# Patient Record
Sex: Female | Born: 1960
Health system: Southern US, Community
[De-identification: ages and names within clinical notes are randomized; demographics above are authoritative.]

## PROBLEM LIST (undated history)

## (undated) DIAGNOSIS — E785 Hyperlipidemia, unspecified: Secondary | ICD-10-CM

## (undated) DIAGNOSIS — U071 COVID-19: Secondary | ICD-10-CM

## (undated) DIAGNOSIS — I10 Essential (primary) hypertension: Secondary | ICD-10-CM

## (undated) DIAGNOSIS — R011 Cardiac murmur, unspecified: Secondary | ICD-10-CM

## (undated) DIAGNOSIS — K5790 Diverticulosis of intestine, part unspecified, without perforation or abscess without bleeding: Secondary | ICD-10-CM

## (undated) DIAGNOSIS — G43909 Migraine, unspecified, not intractable, without status migrainosus: Secondary | ICD-10-CM

## (undated) DIAGNOSIS — M810 Age-related osteoporosis without current pathological fracture: Secondary | ICD-10-CM

## (undated) DIAGNOSIS — N6452 Nipple discharge: Secondary | ICD-10-CM

## (undated) HISTORY — PX: BACK SURGERY: SHX140

## (undated) HISTORY — DX: Essential (primary) hypertension: I10

## (undated) HISTORY — DX: Cardiac murmur, unspecified: R01.1

## (undated) HISTORY — PX: ABDOMINAL HYSTERECTOMY: SHX81

## (undated) HISTORY — DX: Age-related osteoporosis without current pathological fracture: M81.0

## (undated) HISTORY — DX: COVID-19: U07.1

## (undated) HISTORY — DX: Hyperlipidemia, unspecified: E78.5

## (undated) HISTORY — DX: Diverticulosis of intestine, part unspecified, without perforation or abscess without bleeding: K57.90

---

## 2006-01-23 ENCOUNTER — Ambulatory Visit: Payer: Self-pay

## 2006-10-31 ENCOUNTER — Ambulatory Visit: Payer: Self-pay | Admitting: Internal Medicine

## 2006-11-25 ENCOUNTER — Ambulatory Visit: Payer: Self-pay | Admitting: Internal Medicine

## 2007-01-24 ENCOUNTER — Inpatient Hospital Stay: Payer: Self-pay | Admitting: Obstetrics and Gynecology

## 2007-02-02 ENCOUNTER — Emergency Department: Payer: Self-pay | Admitting: Unknown Physician Specialty

## 2007-06-28 ENCOUNTER — Emergency Department: Payer: Self-pay | Admitting: General Practice

## 2007-07-23 ENCOUNTER — Emergency Department: Payer: Self-pay | Admitting: Emergency Medicine

## 2007-12-24 ENCOUNTER — Ambulatory Visit: Payer: Self-pay | Admitting: Internal Medicine

## 2008-01-12 ENCOUNTER — Ambulatory Visit: Payer: Self-pay | Admitting: Internal Medicine

## 2008-02-10 ENCOUNTER — Ambulatory Visit: Payer: Self-pay | Admitting: Gastroenterology

## 2008-03-03 ENCOUNTER — Ambulatory Visit: Payer: Self-pay | Admitting: Internal Medicine

## 2008-03-11 ENCOUNTER — Ambulatory Visit: Payer: Self-pay | Admitting: Gastroenterology

## 2008-04-30 ENCOUNTER — Ambulatory Visit: Payer: Self-pay | Admitting: Internal Medicine

## 2008-05-24 ENCOUNTER — Inpatient Hospital Stay: Payer: Self-pay | Admitting: Internal Medicine

## 2008-05-31 ENCOUNTER — Ambulatory Visit: Payer: Self-pay | Admitting: Internal Medicine

## 2008-06-22 ENCOUNTER — Ambulatory Visit: Payer: Self-pay | Admitting: Internal Medicine

## 2008-06-30 ENCOUNTER — Ambulatory Visit: Payer: Self-pay | Admitting: Internal Medicine

## 2008-10-04 ENCOUNTER — Other Ambulatory Visit: Payer: Self-pay

## 2008-10-04 ENCOUNTER — Emergency Department: Payer: Self-pay

## 2009-04-07 ENCOUNTER — Ambulatory Visit: Payer: Self-pay | Admitting: Internal Medicine

## 2009-07-07 ENCOUNTER — Emergency Department: Payer: Self-pay | Admitting: Emergency Medicine

## 2009-10-04 ENCOUNTER — Ambulatory Visit: Payer: Self-pay | Admitting: Internal Medicine

## 2009-10-12 ENCOUNTER — Ambulatory Visit: Payer: Self-pay | Admitting: Internal Medicine

## 2010-02-25 ENCOUNTER — Observation Stay: Payer: Self-pay | Admitting: Internal Medicine

## 2010-03-06 ENCOUNTER — Encounter: Payer: Self-pay | Admitting: Cardiology

## 2010-07-10 ENCOUNTER — Emergency Department: Payer: Self-pay | Admitting: Emergency Medicine

## 2010-11-01 ENCOUNTER — Ambulatory Visit: Payer: Self-pay | Admitting: Internal Medicine

## 2010-12-08 ENCOUNTER — Encounter: Payer: Self-pay | Admitting: Cardiology

## 2010-12-30 ENCOUNTER — Emergency Department: Payer: Self-pay | Admitting: Emergency Medicine

## 2011-01-09 ENCOUNTER — Ambulatory Visit: Payer: Self-pay

## 2011-01-17 ENCOUNTER — Ambulatory Visit: Payer: Self-pay

## 2011-01-26 ENCOUNTER — Encounter: Payer: Self-pay | Admitting: Cardiology

## 2011-01-26 LAB — CBC
HCT: 42.3 % (ref 36.0–46.0)
Hemoglobin: 14.1 g/dL (ref 12.0–15.0)
MCH: 28.1 pg (ref 26.0–34.0)
RBC: 5.01 MIL/uL (ref 3.87–5.11)

## 2011-01-26 LAB — SURGICAL PCR SCREEN: MRSA, PCR: NEGATIVE

## 2011-01-26 LAB — BASIC METABOLIC PANEL
CO2: 28 mEq/L (ref 19–32)
Chloride: 102 mEq/L (ref 96–112)
Creatinine, Ser: 0.86 mg/dL (ref 0.4–1.2)
GFR calc Af Amer: >60 mL/min (ref >60)
Glucose, Bld: 85 mg/dL (ref 70–99)
Sodium: 140 mEq/L (ref 135–145)

## 2011-01-29 ENCOUNTER — Ambulatory Visit
Admission: RE | Admit: 2011-01-29 | Discharge: 2011-01-29 | Payer: Self-pay | Source: Home / Self Care | Attending: Cardiology | Admitting: Cardiology

## 2011-01-29 DIAGNOSIS — I10 Essential (primary) hypertension: Secondary | ICD-10-CM | POA: Insufficient documentation

## 2011-01-29 DIAGNOSIS — R9431 Abnormal electrocardiogram [ECG] [EKG]: Secondary | ICD-10-CM | POA: Insufficient documentation

## 2011-01-30 ENCOUNTER — Telehealth: Payer: Self-pay | Admitting: Cardiology

## 2011-01-31 ENCOUNTER — Ambulatory Visit (HOSPITAL_COMMUNITY): Payer: 59

## 2011-01-31 ENCOUNTER — Encounter: Payer: Self-pay | Admitting: Cardiology

## 2011-01-31 ENCOUNTER — Ambulatory Visit (HOSPITAL_COMMUNITY): Payer: 59 | Attending: Internal Medicine

## 2011-01-31 DIAGNOSIS — E669 Obesity, unspecified: Secondary | ICD-10-CM | POA: Insufficient documentation

## 2011-01-31 DIAGNOSIS — I10 Essential (primary) hypertension: Secondary | ICD-10-CM | POA: Insufficient documentation

## 2011-01-31 DIAGNOSIS — R9431 Abnormal electrocardiogram [ECG] [EKG]: Secondary | ICD-10-CM | POA: Insufficient documentation

## 2011-02-01 ENCOUNTER — Encounter: Payer: Self-pay | Admitting: Cardiology

## 2011-02-01 ENCOUNTER — Other Ambulatory Visit: Payer: Self-pay | Admitting: Cardiology

## 2011-02-01 ENCOUNTER — Other Ambulatory Visit (INDEPENDENT_AMBULATORY_CARE_PROVIDER_SITE_OTHER): Payer: 59

## 2011-02-01 DIAGNOSIS — R072 Precordial pain: Secondary | ICD-10-CM

## 2011-02-01 DIAGNOSIS — Z79899 Other long term (current) drug therapy: Secondary | ICD-10-CM

## 2011-02-01 DIAGNOSIS — R0789 Other chest pain: Secondary | ICD-10-CM

## 2011-02-01 LAB — BASIC METABOLIC PANEL
CO2: 30 mEq/L (ref 19–32)
Chloride: 99 mEq/L (ref 96–112)
Glucose, Bld: 89 mg/dL (ref 70–99)
Sodium: 137 mEq/L (ref 135–145)

## 2011-02-01 LAB — APTT: aPTT: 30.5 s — ABNORMAL HIGH (ref 21.7–28.8)

## 2011-02-01 LAB — CBC WITH DIFFERENTIAL/PLATELET
Basophils Absolute: 0 10*3/uL (ref 0.0–0.1)
Basophils Relative: 0.2 % (ref 0.0–3.0)
Eosinophils Absolute: 0.1 10*3/uL (ref 0.0–0.7)
Eosinophils Relative: 2.9 % (ref 0.0–5.0)
HCT: 42.5 % (ref 36.0–46.0)
Hemoglobin: 14.4 g/dL (ref 12.0–15.0)
Lymphocytes Relative: 37.3 % (ref 12.0–46.0)
Lymphs Abs: 1.9 10*3/uL (ref 0.7–4.0)
MCHC: 34 g/dL (ref 30.0–36.0)
MCV: 86 fl (ref 78.0–100.0)
Monocytes Absolute: 0.6 10*3/uL (ref 0.1–1.0)
Monocytes Relative: 11.2 % (ref 3.0–12.0)
Neutro Abs: 2.4 10*3/uL (ref 1.4–7.7)
Neutrophils Relative %: 48.4 % (ref 43.0–77.0)
Platelets: 265 10*3/uL (ref 150.0–400.0)
RBC: 4.94 Mil/uL (ref 3.87–5.11)
RDW: 13.3 % (ref 11.5–14.6)
WBC: 5 10*3/uL (ref 4.5–10.5)

## 2011-02-01 LAB — PROTIME-INR
INR: 1.1 ratio — ABNORMAL HIGH (ref 0.8–1.0)
Prothrombin Time: 12.5 s — ABNORMAL HIGH (ref 10.2–12.4)

## 2011-02-02 ENCOUNTER — Inpatient Hospital Stay (HOSPITAL_BASED_OUTPATIENT_CLINIC_OR_DEPARTMENT_OTHER)
Admission: RE | Admit: 2011-02-02 | Discharge: 2011-02-02 | Disposition: A | Discharge status: Home / Self Care | Payer: 59 | Source: Ambulatory Visit | Attending: Cardiovascular Disease | Admitting: Cardiovascular Disease

## 2011-02-02 DIAGNOSIS — M542 Cervicalgia: Secondary | ICD-10-CM | POA: Insufficient documentation

## 2011-02-02 DIAGNOSIS — R9431 Abnormal electrocardiogram [ECG] [EKG]: Secondary | ICD-10-CM | POA: Insufficient documentation

## 2011-02-02 DIAGNOSIS — R9430 Abnormal result of cardiovascular function study, unspecified: Secondary | ICD-10-CM

## 2011-02-05 ENCOUNTER — Ambulatory Visit (HOSPITAL_COMMUNITY): Payer: 59

## 2011-02-05 ENCOUNTER — Ambulatory Visit (HOSPITAL_COMMUNITY)
Admission: RE | Admit: 2011-02-05 | Discharge: 2011-02-06 | Disposition: A | Discharge status: Home / Self Care | Payer: 59 | Attending: Neurosurgery | Admitting: Neurosurgery

## 2011-02-05 DIAGNOSIS — M5020 Other cervical disc displacement, unspecified cervical region: Secondary | ICD-10-CM | POA: Insufficient documentation

## 2011-02-05 DIAGNOSIS — M4712 Other spondylosis with myelopathy, cervical region: Secondary | ICD-10-CM | POA: Insufficient documentation

## 2011-02-05 DIAGNOSIS — I10 Essential (primary) hypertension: Secondary | ICD-10-CM | POA: Insufficient documentation

## 2011-02-05 LAB — BASIC METABOLIC PANEL
CO2: 25 mEq/L (ref 19–32)
Calcium: 9.5 mg/dL (ref 8.4–10.5)
Glucose, Bld: 83 mg/dL (ref 70–99)
Sodium: 141 mEq/L (ref 135–145)

## 2011-02-05 LAB — CBC
HCT: 39.1 % (ref 36.0–46.0)
Hemoglobin: 13.5 g/dL (ref 12.0–15.0)
MCH: 28.7 pg (ref 26.0–34.0)
MCHC: 34.5 g/dL (ref 30.0–36.0)
MCV: 83 fL (ref 78.0–100.0)

## 2011-02-06 NOTE — Op Note (Signed)
Joyce Simpson, Joyce Simpson NO.:  [id number]  MEDICAL RECORD NO.:  [id number]           PATIENT TYPE:  O  LOCATION:  3526                         FACILITY:  [facility]  PHYSICIAN:  Cristi Loron, M.D.DATE OF BIRTH:  Jun 23, 1961  DATE OF PROCEDURE:  02/05/2011 DATE OF DISCHARGE:                              OPERATIVE REPORT   HISTORY:  The patient is a 50 year old black female who has suffered from neck and arm pain consistent with the C7 radiculopathy.  She has failed medical management, worked up with a cervical MRI, which demonstrated the patient had a herniated disk and spondylosis at C6-7 on the left.  I discussed the various treatment option with the patient including surgery.  The patient has weighed the risks, benefits, and alternatives of surgery, so I will proceed with a C6-7 anterior cervical diskectomy with fusion plating.  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  PROCEDURE:  C6-7 extensive anterior cervicectomy/decompression; C6-7 anterior interbody arthrodesis with local morselized autograft bone and Actifuse bone graft extender; insertion of C6-7 interbody prosthesis (Novel PEEK interbody prosthesis); C6-7 anterior cervical plating with skyline titanium plate and screws.  SURGEON:  Cristi Loron, MD  ASSISTANT:  Stefani Dama, MD  ANESTHESIA:  General endotracheal.  ESTIMATED BLOOD LOSS:  50 mL.  SPECIMENS:  None.  DRAINS:  None.  COMPLICATIONS:  None.  DESCRIPTION OF PROCEDURE:  The patient was brought to the operating room by the anesthesia team.  General endotracheal anesthesia was induced. The patient remained in supine position.  A roll was placed under the patient's shoulders to place her neck in a neutral position and  cervical region was then prepared with Betadine scrub and Betadine solution. Sterile drapes were applied and then injected the area to be incised with Marcaine with epinephrine solution.  I used a scalpel to make a transverse incision in the patient's left anterior neck.  I used the Metzenbaum scissors to divide the platysma muscle and then to dissect medial to sternocleidomastoid muscle, jugular vein, and carotid artery. I carefully dissected down towards the anterior cervical spine identifying the esophagus and retracting it medially.  I then used Kitner swab to clear soft tissue from the anterior cervical spine.  We inserted a bent spinal needle into the exposed intervertebral disk space.  We obtained intraoperative radiograph to confirm our location.  We then used electrocautery to detach the medial border of the longus colli muscle bilaterally from the C6-7 intervertebral disk space.  We inserted the Caspar self-retaining retractor underneath the longus colli muscle bilaterally to provide exposure.  We began the decompression by incising the C6-7 intervertebral disk with a 15 blade scalpel.  The disk space was quite spondylotic and collapsed.  We then inserted the distraction screws at C6 and C7, distracted the interspace and then used the high-speed drill to decorticate the vertebral endplates at C6-7, to drill away the remainder of C6-7 intervertebral disk, to drill away some posterior spondylosis, and to thin out the posterior longitudinal ligament.  We then incised the  ligament with arachnoid knife and removed it with a Kerrison punch undercutting the vertebral endplates of C6-7 decompressing the thecal sac and then performed foraminotomies about the bilateral C7 nerve root.  Of note, we encountered disk herniation on left side.  We removed it with pituitary forceps and Kerrison punches. This completed the decompression.  We now turned attention to arthrodesis.  We used the  trial spacers and determined to use a 4-mm medium interbody prosthesis.  We prefilled prosthesis with the combination of local autograft bone we obtained during decompression as well as Actifuse bone graft extender.  We inserted the prosthesis and distracted C6-7 interspace and then removed the distraction screws.  There was a good snug fit of the prosthesis in the interspace.  We now turned attention to anterior spinal instrumentation.  We used high-speed drill to remove some ventral spondylosis from the vertebral endplates at C6-C7 so that the plate would lay down flat.  We selected the appropriate length Skyline titanium plate and laid it along the anterior aspect of the vertebral bodies at C6 and C7.  We then drilled two 12 mm holes at C6-C7.  We then secured the plate to the vertebral bodies by placing two 12-mm self-tapping screws at C6 and to C7.  We then obtained intraoperative radiograph, which demonstrated good position of plate, screws, and interbody prosthesis.  We then secured the screws and plate by locking each cam.  This completed the instrumentation.  We then obtained hemostasis using bipolar electrocautery.  We irrigated the wound out with bacitracin solution.  We then removed the retractor. We then inspected the esophagus for any damage, there was none apparent. We then reapproximated the patient's platysma muscle with interrupted 3- 0 Vicryl suture, subcutaneous tissue with interrupted 3-0 Vicryl suture, and skin with Steri-Strips and Benzoin.  The wound was then coated with bacitracin ointment.  A sterile dressing applied.  The drapes were removed, and the patient was subsequently extubated by the anesthesia team and transported to the postanesthesia care unit in stable condition.  All sponge, instrument, and needle counts were correct at the end of this case.     Cristi Loron, M.D.     JDJ/MEDQ  D:  02/05/2011  T:  02/06/2011  Job:   [id number]  Electronically Signed by Tressie Stalker M.D. on 02/06/2011 01:46:47 PM

## 2011-02-07 NOTE — Letter (Signed)
Summary: Cardiac Catheterization Instructions- [facility]  [employer], Main Office  [address]   [place], [place] [address]   Phone: [phone]  Fax: [phone]         02/01/2011 MRN: [id number]  [facility] [address] [place], [place]  [address]  Dear Joyce Simpson,   You are scheduled for a Cardiac Catheterization on Friday Feb 02, 2011 with Dr. Delane Ginger  Please arrive to the 1st floor of the Heart and Vascular Center at [facility] at 6:30 am on the day of your procedure. Please do not arrive before 6:30 a.m. Call the Heart and Vascular Center at [phone] if you are unable to make your appointmnet. The Code to get into the parking garage under the building is 0300. Take the elevators to the 1st floor. You must have someone to drive you home. Someone must be with you for the first 24 hours after you arrive home. Please wear clothes that are easy to get on and off and wear slip-on shoes. Do not eat or drink after midnight except water with your medications that morning. Bring all your medications and current insurance cards with you.   ___ Make sure you take your aspirin.  ___ You may take ALL of your medications with water that morning.      The usual length of stay after your procedure is 2 to 3 hours. This can vary.  If you have any questions, please call the office at the number listed above.   Charolotte Capuchin, RN for Dr Rollene Rotunda

## 2011-02-07 NOTE — Assessment & Plan Note (Signed)
Summary: SURGICAL CLEARENCE/PT HAVING C6,7 NET FUSION/LG   Visit Type:  Pre-op Evaluation Primary Provider:  Dr. Park Breed   CC:  Abnromal EKG.  History of Present Illness: The patient presents for preoperative evaluation prior to having cervical neck surgery. She's had chronic left arm pain since New Year's Eve and is to have a C6/7 ACDF. However, preoperatively she was found to have an abnormal EKG with inferior and lateral T-wave inversions. There is an EKG for comparison but could not demonstrate this. The patient has had long-standing hypertension and does have LVH voltage. It is very likely that this represents repolarization abnormalities. She reports having a stress test last year that she says was negative. She denies any active cardiovascular symptoms. Despite her arm pain she still able to exercise riding a bicycle. She denies any chest pressure, neck or arm discomfort. She denies any palpitations, presyncope or syncope. She has no shortness of breath, PND or orthopnea. She has had no weight gain or edema.  Current Medications (verified): 1)  Hydrochlorothiazide 12.5 Mg Caps (Hydrochlorothiazide) .Marland Kitchen.. 1 By Mouth Daily 2)  Benazepril Hcl 40 Mg Tabs (Benazepril Hcl) .Marland Kitchen.. 1 By Mouth Two Times A Day 3)  Percocet 10-325 Mg Tabs (Oxycodone-Acetaminophen) .... As Needed  Allergies (verified): No Known Drug Allergies  Past History:  Past Medical History: Hypertension x years Migraines in the past Diverticulitis  Past Surgical History: C section Hysterectomy  Family History: No early CAD Mother and brother died from cerebral aneurysms  Social History: She is married with five children She has never smoked cigarettes and does not drink alchohol.  Review of Systems       As stated in the HPI and negative for all other systems.   Vital Signs:  Patient profile:   50 year old female Height:      60 inches Weight:      167 pounds BMI:     32.73 Pulse rate:   66 / minute Resp:      16 per minute BP sitting:   182 / 101  (left arm)  Vitals Entered By: Marrion Coy, CNA (January 29, 2011 3:27 PM)  Physical Exam  General:  Well developed, well nourished, in no acute distress. Head:  normocephalic and atraumatic Eyes:  PERRLA/EOM intact; conjunctiva and lids normal. Mouth:  Teeth, gums and palate normal. Oral mucosa normal. Neck:  Neck supple, no JVD. No masses, thyromegaly or abnormal cervical nodes. Chest Wall:  no deformities or breast masses noted Lungs:  Clear bilaterally to auscultation and percussion. Abdomen:  Bowel sounds positive; abdomen soft and non-tender without masses, organomegaly, or hernias noted. No hepatosplenomegaly. Msk:  Back normal, normal gait. Muscle strength and tone normal. Extremities:  No clubbing or cyanosis. Neurologic:  Alert and oriented x 3. Skin:  Intact without lesions or rashes. Cervical Nodes:  no significant adenopathy Axillary Nodes:  no significant adenopathy Inguinal Nodes:  no significant adenopathy Psych:  Normal affect.   Detailed Cardiovascular Exam  Neck    Carotids: Carotids full and equal bilaterally without bruits.      Neck Veins: Normal, no JVD.    Heart    Inspection: no deformities or lifts noted.      Palpation: normal PMI with no thrills palpable.      Auscultation: regular rate and rhythm, S1, S2 without murmurs, rubs, gallops, or clicks.    Vascular    Abdominal Aorta: no palpable masses, pulsations, or audible bruits.      Femoral Pulses: normal  femoral pulses bilaterally.      Pedal Pulses: normal pedal pulses bilaterally.      Radial Pulses: normal radial pulses bilaterally.      Peripheral Circulation: no clubbing, cyanosis, or edema noted with normal capillary refill.     EKG  Procedure date:  01/26/2011  Findings:      Sinus rhythm, rate 61, axis within normal limits, intervals within normal limits, poor anterior R-wave progression, left ventricular hypertrophy by voltage criteria,  lateral and inferior T-wave inversions questionable ischemia.  Impression & Recommendations:  Problem # 1:  PREOPERATIVE EXAMINATION (ICD-V72.84) Given the abnormal EKG I need to try to find her recent stress test. Of note I was able to get an old EKG from her primary care MD dated Feb 2011.  She had similar T wave changes at that time in the anterolateral and less so the inferior leads.  If the stress test is unremarkable no further testing will be indicated.  Problem # 2:  ESSENTIAL HYPERTENSION, BENIGN (ICD-401.1) Her blood pressure is elevated probably because but still I think better control and so I will add amlodipine 2.5 mg daily.  Problem # 3:  ABNORMAL ELECTROCARDIOGRAM (ICD-794.31) This will be evaluated as above.  Patient Instructions: 1)  Your physician recommends that you schedule a follow-up appointment as needed 2)  Your physician has recommended you make the following change in your medication: start Amlodipine 2.5 mg once a day Prescriptions: AMLODIPINE BESYLATE 2.5 MG TABS (AMLODIPINE BESYLATE) once daily  #30 x 11   Entered by:   Charolotte Capuchin, RN   Authorized by:   Rollene Rotunda, MD, [facility]   Signed by:   Charolotte Capuchin, RN on 01/29/2011   Method used:   Electronically to        [facility]  [address] [phone].* (retail)       [address]       [place], [place]  [address]       Ph: [phone] or [phone]       Fax: [phone]   RxID:   [id number]  I have reviewed and approved all prescriptions at the time of this visit. Rollene Rotunda, MD, [facility]  January 29, 2011 6:02 PM  Appended Document: SURGICAL CLEARENCE/PT HAVING C6,7 NET FUSION/LG faxed Since this note the patient had an abnormal stress echo.  However, catheterization today demonstrated normal coronaries.  She is at acceptable risk for the planned surgery.  No further cardiovascular testing is indicated.

## 2011-02-07 NOTE — Progress Notes (Signed)
Summary: MD office want information  cath scheduled  Phone Note From Other Clinic Call back at [phone] ext [phone]   Caller: Darl Pikes from Dr. Delma Officer office Request: Talk with Nurse, Talk with Provider Summary of Call: I told her that we have the results of the stress test but MD would not be in office until Thursday but you sent him a flag letting him know the results are in but we couldn't promise he would sign off before Thursday. She said the patient's surgery is Thursday and I explained we have done all we can and it will be up to Dr. Antoine Poche and she asked for the results of the stress test and the office note to be faxed to her @ [phone] Initial call taken by: Omer Jack,  January 30, 2011 4:58 PM  Follow-up for Phone Call        faxed Dr Joleen Stuckert's note to Dr Lovell Sheehan office  Sander Nephew, RN 5:39pm 01/30/2011  Dr Antoine Poche aware of stress test results from Dr Welton Flakes.  He would like the pt to have either a stress or dobutamine echo today. I spoke with the pt and made her aware that we are trying to arrange testing.  The pt has not had anything to eat or drink since 8:30 this morning.  Julieta Gutting, RN, BSN  January 31, 2011 11:58 AM  Pt aware of stress echo appt today at 1:30.  Julieta Gutting, RN, BSN  January 31, 2011 12:20 PM  Additional Follow-up for Phone Call Additional follow up Details #1::        Dr Eden Emms read study and discussed with Dr Antoine Poche.  The pt's surgery will need to be cancelled for tomorrow and resheduled for next week.  Dr Antoine Poche will call the pt to discuss possible cardiac cath on Friday.  I left a message on the pt's phone [phone]) that we cannot clear the pt for surgery at this time.  I also left a message with [facility] answering service that surgery would need to be cancelled.  Julieta Gutting, RN, BSN  January 31, 2011 5:09 PM    Additional Follow-up for Phone Call Additional follow up Details #2::    I called the patient.   She needs a cath which can be done on Friday.  We will call and arrange this tomorrow. Follow-up by: Rollene Rotunda, MD, [facility],  January 31, 2011 5:23 PM  Additional Follow-up for Phone Call Additional follow up Details #3:: Details for Additional Follow-up Action Taken: pt scheduled for left heart cath 02/02/2011 at 7:30 am [facility].  Coming today for labs and instructions will given at that time. Additional Follow-up by: Charolotte Capuchin, RN,  February 01, 2011 9:45 AM

## 2011-02-07 NOTE — Progress Notes (Signed)
Summary: question about having some test done due to surgery/2nd call  Phone Note Call from Patient Call back at Work Phone [phone]   Caller: Patient Summary of Call: Pt have questions about some test due to surgery Initial call taken by: Judie Grieve,  January 30, 2011 8:22 AM  Follow-up for Phone Call        Spoke with pt and she is aware Dr Santo Held office did not sent the results of the stress test yesterday and I will call again to have them resend them.  9:25am  Sander Nephew, RN  records recieved and are on Dr Dardan Shelton's cart for his review.   pt wants to talk to dr Naziah Portee nurse re surgical clearance for her surgery for disc removal on thursday. # [phone] until 5pm # [phone] after 5pm Follow-up by: Roe Coombs,  January 30, 2011 3:36 PM  Additional Follow-up for Phone Call Additional follow up Details #1::        Dr Jenene Slicker office note faxed to Dr Lovell Sheehan office with information that Dr Antoine Poche needs to review the stress test results from Dr Santo Held office.  He is in the hospital working tomorrow and we will have him review and give clearance accordingly Additional Follow-up by: Charolotte Capuchin, RN,  January 30, 2011 5:47 PM

## 2011-02-11 NOTE — Procedures (Signed)
  NAMELYLLIE, COBBINS NO.:  [id number]  MEDICAL RECORD NO.:  [id number]           PATIENT TYPE:  O  LOCATION:  ST3NUCME                     FACILITY:  [facility]  PHYSICIAN:  Vesta Mixer, M.D. DATE OF BIRTH:  06-20-1961  DATE OF PROCEDURE: DATE OF DISCHARGE:                           CARDIAC CATHETERIZATION   ADDENDUM     Vesta Mixer, M.D.     PJN/MEDQ  D:  02/02/2011  T:  02/02/2011  Job:  [id number]  cc:   Cristi Loron, M.D. Rollene Rotunda, MD, [facility]  Electronically Signed by Kristeen Miss M.D. on 02/09/2011 04:50:50 PM

## 2011-02-11 NOTE — Procedures (Signed)
  NAMESTEPAHNIE, CAMPO NO.:  [id number]  MEDICAL RECORD NO.:  [id number]           PATIENT TYPE:  O  LOCATION:  ST3NUCME                     FACILITY:  [facility]  PHYSICIAN:  Vesta Mixer, M.D. DATE OF BIRTH:  10/28/1961  DATE OF PROCEDURE:  02/02/2011 DATE OF DISCHARGE:                           CARDIAC CATHETERIZATION   Joyce Simpson is a 50 year old female with a history of neck pain.  She needs to have neck surgery.  She had a preoperative stress echocardiogram which was abnormal.  She was scheduled for heart catheterization for further evaluation.  PROCEDURE:  Left heart catheterization with coronary angiography.  The right femoral artery was easily cannulated using modified Seldinger technique.  HEMODYNAMIC RESULTS:  LV pressure is 143/5.  The aortic pressure is 140/84.  Angiography:  Left main:  The left main is smooth and normal.  The left anterior descending artery is smooth and normal.  There is a large first diagonal artery which is also smooth and normal.  The terminal LAD wraps around the apex and supplies at least half of the inferoapical segment.  The left circumflex artery is smooth and normal.  It was off a first obtuse marginal artery which is fairly large and is normal.  Small posterolateral branch is normal.  The right coronary artery is relatively small, but is dominant.  It is smooth and normal throughout its course.  It gives off a very small posterior descending artery which is normal.  Previously, the inferior wall was fed via the right coronary artery and the distal aspect of the LAD.  Left ventriculogram:  The left ventriculogram was performed in the 30 RAO position.  It reveals normal left ventricular systolic function with ejection fraction of 55%.  COMPLICATIONS:  None.  CONCLUSION: 1. Smooth and normal coronary arteries. 2. Normal left ventricular systolic function.  The patient is at low     risk for her upcoming  neck surgery.     Vesta Mixer, M.D.     PJN/MEDQ  D:  02/02/2011  T:  02/02/2011  Job:  [id number]  cc:   Rollene Rotunda, MD, [facility]  Electronically Signed by Kristeen Miss M.D. on 02/09/2011 04:50:41 PM

## 2011-02-27 NOTE — Letter (Signed)
Summary: [facility] - Exercise Stress  [facility] - Exercise Stress   Imported By: Marylou Mccoy 02/20/2011 16:27:19  _____________________________________________________________________  External Attachment:    Type:   Image     Comment:   External Document

## 2011-02-27 NOTE — Letter (Signed)
Summary: [facility]   Imported By: Marylou Mccoy 02/20/2011 16:26:18  _____________________________________________________________________  External Attachment:    Type:   Image     Comment:   External Document

## 2011-09-21 ENCOUNTER — Emergency Department: Payer: Self-pay | Admitting: Emergency Medicine

## 2012-03-06 ENCOUNTER — Ambulatory Visit: Payer: Self-pay | Admitting: Internal Medicine

## 2012-03-18 ENCOUNTER — Ambulatory Visit: Payer: Self-pay | Admitting: Specialist

## 2012-03-21 ENCOUNTER — Ambulatory Visit: Payer: Self-pay | Admitting: Specialist

## 2013-03-19 ENCOUNTER — Ambulatory Visit: Payer: Self-pay | Admitting: Internal Medicine

## 2013-05-05 ENCOUNTER — Emergency Department: Payer: Self-pay | Admitting: Emergency Medicine

## 2013-05-05 LAB — BASIC METABOLIC PANEL
BUN: 10 mg/dL (ref 7–18)
Creatinine: 0.58 mg/dL — ABNORMAL LOW (ref 0.60–1.30)
EGFR (African American): >60
Glucose: 84 mg/dL (ref 65–99)
Potassium: 4.1 mmol/L (ref 3.5–5.1)

## 2013-05-05 LAB — CBC WITH DIFFERENTIAL/PLATELET
Basophil #: 0 10*3/uL (ref 0.0–0.1)
Basophil %: 0.6 %
HCT: 39.4 % (ref 35.0–47.0)
HGB: 13.4 g/dL (ref 12.0–16.0)
MCHC: 34.1 g/dL (ref 32.0–36.0)
MCV: 83 fL (ref 80–100)
Monocyte #: 0.4 x10 3/mm (ref 0.2–0.9)
Platelet: 234 10*3/uL (ref 150–440)
RBC: 4.74 10*6/uL (ref 3.80–5.20)
WBC: 4.7 10*3/uL (ref 3.6–11.0)

## 2014-04-12 ENCOUNTER — Ambulatory Visit: Payer: Self-pay | Admitting: Internal Medicine

## 2014-10-11 ENCOUNTER — Encounter: Payer: Self-pay | Admitting: Cardiology

## 2015-04-24 NOTE — Op Note (Signed)
PATIENT NAME:  Joyce Simpson, NORTHROP MR#:  [id number] DATE OF BIRTH:  1961/02/14  DATE OF PROCEDURE:  03/21/2012  PREOPERATIVE DIAGNOSES:  1. De Quervain's tendinitis, left wrist.  2. Tendon sheath ganglion, left wrist.   POSTOPERATIVE DIAGNOSES: 1. De Quervain's tendinitis, left wrist.  2. Tendon sheath ganglion, left wrist.   PROCEDURES:  1. Release of first dorsal extensor compartment.  2. Excision of tendon sheath ganglion.   SURGEON: Lucas Mallow, MD    ANESTHESIA: General.   COMPLICATIONS: None.   TOURNIQUET TIME: Approximately 30 minutes.   PROCEDURE: After adequate induction of general anesthesia, the left upper extremity is thoroughly prepped with ChloraPrep and draped in standard sterile fashion. The extremity is wrapped out with the Esmarch bandage and pneumatic tourniquet elevated to 250 mmHg. Under loupe magnification, transverse 1.5 cm incision is made over the radial styloid area directly over the prominence of the tendon sheath ganglion. The dissection is carried down using loupe magnification with preservation of the cutaneous nerves. The tendon sheath ganglion is completely excised. The first dorsal extensor compartment is then completely released. There is a moderate amount of excess synovial fluid present but minimal synovitis. Careful check for any other compartment is made and none are seen. The wound is thoroughly irrigated multiple times. Skin edges are infiltrated with 0.5% plain Marcaine. Skin is closed with a running subcuticular 3-0 Prolene. Soft bulky dressing is applied. Tourniquet is released.    The patient is returned to the recovery room in satisfactory condition having tolerated the procedure quite well.   ____________________________ Lucas Mallow, MD ces:drc D: 03/21/2012 08:36:04 ET T: 03/21/2012 09:22:44 ET JOB#: [id number]  cc: Lucas Mallow, MD, <Dictator> Lucas Mallow MD ELECTRONICALLY SIGNED 04/01/2012 13:04

## 2015-06-02 ENCOUNTER — Telehealth: Payer: Self-pay | Admitting: Family Medicine

## 2015-06-02 NOTE — Telephone Encounter (Signed)
Pt requesting refill on benazepril and amlodipine besylate. Please send to [facility]. She has 5 pills left on both

## 2015-06-03 NOTE — Telephone Encounter (Signed)
Patient has not been seen since 01/28/15 at [facility].  Patient will need appt for refills.

## 2015-06-08 ENCOUNTER — Telehealth: Payer: Self-pay | Admitting: Family Medicine

## 2015-06-08 DIAGNOSIS — I10 Essential (primary) hypertension: Secondary | ICD-10-CM | POA: Insufficient documentation

## 2015-06-08 DIAGNOSIS — E538 Deficiency of other specified B group vitamins: Secondary | ICD-10-CM | POA: Insufficient documentation

## 2015-06-08 MED ORDER — BENAZEPRIL HCL 40 MG PO TABS: 40.0000 mg | ORAL_TABLET | Freq: Every day | ORAL | Status: DC

## 2015-06-08 MED ORDER — AMLODIPINE BESYLATE 5 MG PO TABS: 5.0000 mg | ORAL_TABLET | Freq: Every day | ORAL | Status: DC

## 2015-06-08 NOTE — Telephone Encounter (Signed)
Refilled medication for 1 month only and patient has appt.

## 2015-06-14 ENCOUNTER — Encounter: Payer: Self-pay | Admitting: Family Medicine

## 2015-06-14 ENCOUNTER — Ambulatory Visit (INDEPENDENT_AMBULATORY_CARE_PROVIDER_SITE_OTHER): Payer: 59 | Admitting: Family Medicine

## 2015-06-14 VITALS — BP 140/90 | HR 85 | Resp 15 | Ht 60.5 in | Wt 176.8 lb

## 2015-06-14 DIAGNOSIS — E785 Hyperlipidemia, unspecified: Secondary | ICD-10-CM | POA: Diagnosis not present

## 2015-06-14 DIAGNOSIS — I10 Essential (primary) hypertension: Secondary | ICD-10-CM | POA: Diagnosis not present

## 2015-06-14 DIAGNOSIS — J069 Acute upper respiratory infection, unspecified: Secondary | ICD-10-CM

## 2015-06-14 MED ORDER — GUAIFENESIN-CODEINE 100-10 MG/5ML PO SOLN: 5.0000 mL | Freq: Four times a day (QID) | ORAL | Status: DC | PRN

## 2015-06-14 MED ORDER — BENAZEPRIL HCL 40 MG PO TABS: 40.0000 mg | ORAL_TABLET | Freq: Every day | ORAL | Status: DC

## 2015-06-14 MED ORDER — AMLODIPINE BESYLATE 5 MG PO TABS: 5.0000 mg | ORAL_TABLET | Freq: Every day | ORAL | Status: DC

## 2015-06-14 MED ORDER — FLUTICASONE PROPIONATE 50 MCG/ACT NA SUSP: 2.0000 | Freq: Every day | NASAL | Status: DC

## 2015-06-14 NOTE — Progress Notes (Signed)
Name: Joyce Simpson   MRN: [id number]    DOB: 02-01-1961   Date:06/14/2015       Progress Note  Subjective  Chief Complaint  Chief Complaint  Patient presents with  . Hypertension    Hypertension This is a chronic problem. The problem is unchanged. The problem is controlled. Pertinent negatives include no chest pain, headaches, orthopnea, palpitations, peripheral edema or shortness of breath. Past treatments include ACE inhibitors and calcium channel blockers. The current treatment provides significant improvement. There is no history of kidney disease, CAD/MI or CVA.  Cough This is a new problem. The current episode started in the past 7 days. The problem has been unchanged. The cough is productive of sputum. Associated symptoms include rhinorrhea and a sore throat. Pertinent negatives include no chest pain, chills, fever, headaches, hemoptysis, myalgias or shortness of breath. She has tried nothing for the symptoms. There is no history of asthma, COPD or pneumonia.  Hyperlipidemia This is a chronic problem. Recent lipid tests were reviewed and are high. Factors aggravating her hyperlipidemia include fatty foods. Pertinent negatives include no chest pain, leg pain, myalgias or shortness of breath. She is currently on no antihyperlipidemic treatment.      Past Medical History  Diagnosis Date  . Hypertension     Past Surgical History  Procedure Laterality Date  . Abdominal hysterectomy    . Back surgery      Family History  Problem Relation Age of Onset  . Hypertension Mother   . Cancer Brother     Colon  . Hypertension Brother   . Diabetes Daughter   . Cancer Maternal Aunt     Breast  . Cancer Paternal Aunt     Breast    History   Social History  . Marital Status: Married    Spouse Name: N/A  . Number of Children: N/A  . Years of Education: N/A   Occupational History  . Not on file.   Social History Main Topics  . Smoking status: Never Smoker   . Smokeless  tobacco: Never Used  . Alcohol Use: No  . Drug Use: No  . Sexual Activity: Not on file   Other Topics Concern  . Not on file   Social History Narrative  . No narrative on file     Current outpatient prescriptions:  .  amLODipine (NORVASC) 5 MG tablet, Take 1 tablet (5 mg total) by mouth daily., Disp: 30 tablet, Rfl: 0 .  benazepril (LOTENSIN) 40 MG tablet, Take 1 tablet (40 mg total) by mouth daily., Disp: 30 tablet, Rfl: 0  No Known Allergies   Review of Systems  Constitutional: Negative for fever and chills.  HENT: Positive for rhinorrhea and sore throat.   Respiratory: Positive for cough. Negative for hemoptysis and shortness of breath.   Cardiovascular: Negative for chest pain, palpitations and orthopnea.  Musculoskeletal: Negative for myalgias.  Neurological: Negative for headaches.      Objective  Filed Vitals:   06/14/15 0842  BP: 140/90  Pulse: 85  Resp: 15  Height: 5' 0.5" (1.537 m)  Weight: 176 lb 12.8 oz (80.196 kg)  SpO2: 98%    Physical Exam  Constitutional: She is well-developed, well-nourished, and in no distress.  HENT:  Head: Normocephalic and atraumatic.  Right Ear: Hearing, tympanic membrane, external ear and ear canal normal.  Left Ear: Hearing, tympanic membrane, external ear and ear canal normal.  Nose: Mucosal edema (nasal turbinate hypertrophy) present. Right sinus exhibits no maxillary sinus  tenderness and no frontal sinus tenderness. Left sinus exhibits no maxillary sinus tenderness and no frontal sinus tenderness.  Mouth/Throat: Posterior oropharyngeal edema and posterior oropharyngeal erythema present.  Cardiovascular: Normal rate and regular rhythm.   Pulmonary/Chest: Effort normal and breath sounds normal.  Nursing note and vitals reviewed.      No results found for this or any previous visit (from the past 2160 hour(s)).   Assessment & Plan 1. Essential hypertension, benign Blood pressure elevated, most likely from upper  respiratory infection symptoms. Refills provided. - amLODipine (NORVASC) 5 MG tablet; Take 1 tablet (5 mg total) by mouth daily.  Dispense: 90 tablet; Refill: 0 - benazepril (LOTENSIN) 40 MG tablet; Take 1 tablet (40 mg total) by mouth daily.  Dispense: 90 tablet; Refill: 0  2. Hyperlipidemia We will reorder fasting lipid panel today and follow-up. - Lipid Profile  3. Upper respiratory infection  - guaiFENesin-codeine 100-10 MG/5ML syrup; Take 5 mLs by mouth every 6 (six) hours as needed for cough.  Dispense: 120 mL; Refill: 0 - fluticasone (FLONASE) 50 MCG/ACT nasal spray; Place 2 sprays into both nostrils daily.  Dispense: 16 g; Refill: 6  There are no diagnoses linked to this encounter.  Makinze Jani Asad A. [facility] 06/14/2015 8:54 AM

## 2015-06-15 LAB — LIPID PANEL
CHOLESTEROL TOTAL: 183 mg/dL (ref 100–199)
Chol/HDL Ratio: 2.7 ratio units (ref 0.0–4.4)
HDL: 67 mg/dL (ref >39)
LDL Calculated: 104 mg/dL — ABNORMAL HIGH (ref 0–99)
Triglycerides: 62 mg/dL (ref 0–149)
VLDL CHOLESTEROL CAL: 12 mg/dL (ref 5–40)

## 2015-09-09 ENCOUNTER — Encounter: Payer: Self-pay | Admitting: Emergency Medicine

## 2015-09-09 ENCOUNTER — Emergency Department: Payer: 59

## 2015-09-09 ENCOUNTER — Emergency Department
Admission: EM | Admit: 2015-09-09 | Discharge: 2015-09-09 | Disposition: A | Discharge status: Home / Self Care | Payer: 59 | Attending: Emergency Medicine | Admitting: Emergency Medicine

## 2015-09-09 DIAGNOSIS — R51 Headache: Secondary | ICD-10-CM | POA: Diagnosis not present

## 2015-09-09 DIAGNOSIS — R531 Weakness: Secondary | ICD-10-CM | POA: Insufficient documentation

## 2015-09-09 DIAGNOSIS — R42 Dizziness and giddiness: Secondary | ICD-10-CM | POA: Diagnosis present

## 2015-09-09 DIAGNOSIS — Z79899 Other long term (current) drug therapy: Secondary | ICD-10-CM | POA: Insufficient documentation

## 2015-09-09 DIAGNOSIS — I10 Essential (primary) hypertension: Secondary | ICD-10-CM | POA: Diagnosis not present

## 2015-09-09 HISTORY — DX: Migraine, unspecified, not intractable, without status migrainosus: G43.909

## 2015-09-09 LAB — URINALYSIS COMPLETE WITH MICROSCOPIC (ARMC ONLY)
BILIRUBIN URINE: NEGATIVE
Glucose, UA: NEGATIVE mg/dL
HGB URINE DIPSTICK: NEGATIVE
Ketones, ur: NEGATIVE mg/dL
Leukocytes, UA: NEGATIVE
Nitrite: NEGATIVE
PH: 8 (ref 5.0–8.0)
Protein, ur: NEGATIVE mg/dL
Specific Gravity, Urine: 1.008 (ref 1.005–1.030)

## 2015-09-09 LAB — BASIC METABOLIC PANEL
Anion gap: 8 (ref 5–15)
BUN: 12 mg/dL (ref 6–20)
CHLORIDE: 108 mmol/L (ref 101–111)
CO2: 25 mmol/L (ref 22–32)
CREATININE: 0.71 mg/dL (ref 0.44–1.00)
Calcium: 9.4 mg/dL (ref 8.9–10.3)
GFR calc Af Amer: >60 mL/min (ref >60)
GFR calc non Af Amer: >60 mL/min (ref >60)
Glucose, Bld: 104 mg/dL — ABNORMAL HIGH (ref 65–99)
POTASSIUM: 3.8 mmol/L (ref 3.5–5.1)
Sodium: 141 mmol/L (ref 135–145)

## 2015-09-09 LAB — CBC
HEMATOCRIT: 43.1 % (ref 35.0–47.0)
Hemoglobin: 14.2 g/dL (ref 12.0–16.0)
MCH: 27.4 pg (ref 26.0–34.0)
MCHC: 33 g/dL (ref 32.0–36.0)
MCV: 83.2 fL (ref 80.0–100.0)
PLATELETS: 220 10*3/uL (ref 150–440)
RBC: 5.18 MIL/uL (ref 3.80–5.20)
RDW: 13.3 % (ref 11.5–14.5)
WBC: 6.7 10*3/uL (ref 3.6–11.0)

## 2015-09-09 MED ORDER — IOHEXOL 350 MG/ML SOLN
75.0000 mL | Freq: Once | INTRAVENOUS | Status: AC | PRN
  Administered 2015-09-09: 75 mL via INTRAVENOUS

## 2015-09-09 MED ORDER — SODIUM CHLORIDE 0.9 % IV BOLUS (SEPSIS)
1000.0000 mL | Freq: Once | INTRAVENOUS | Status: AC
  Administered 2015-09-09: 1000 mL via INTRAVENOUS

## 2015-09-09 MED ORDER — ONDANSETRON HCL 4 MG/2ML IJ SOLN
4.0000 mg | Freq: Once | INTRAMUSCULAR | Status: AC
  Administered 2015-09-09: 4 mg via INTRAVENOUS
  Filled 2015-09-09: qty 2

## 2015-09-09 MED ORDER — MECLIZINE HCL 25 MG PO TABS: 25.0000 mg | ORAL_TABLET | Freq: Three times a day (TID) | ORAL | Status: DC | PRN

## 2015-09-09 MED ORDER — MECLIZINE HCL 25 MG PO TABS
50.0000 mg | ORAL_TABLET | Freq: Once | ORAL | Status: AC
  Administered 2015-09-09: 50 mg via ORAL
  Filled 2015-09-09: qty 2

## 2015-09-09 NOTE — ED Notes (Signed)
Pt reports one week of worsening dizziness (starting 5days ago) worsening to nausea.  Denies falling.  Pt also reports losing her balance and headaches and weakness.

## 2015-09-09 NOTE — Discharge Instructions (Signed)

## 2015-09-09 NOTE — ED Provider Notes (Signed)
[facility] Emergency Department Provider Note  ____________________________________________  Time seen: 9:15 AM on arrival by EMS  I have reviewed the triage vital signs and the nursing notes.   HISTORY  Chief Complaint Dizziness    HPI Joyce Simpson is a 54 y.o. female who complains of generalized weakness and worsening dizziness over the past week. The dizziness is gradual onset as it is the generalized weakness and generalized headaches. She reports that it's been fairly constant although gets worse whenever she tries to get upright or changes position. Lying down flat from being upright makes her dizziness much worse. Denies any change in vision or hearing. No focal numbness tingling or weakness. No syncope chest pain shortness of breath or recent illness.     Past Medical History  Diagnosis Date  . Hypertension   . Migraines      Patient Active Problem List   Diagnosis Date Noted  . Hyperlipidemia 06/14/2015  . Upper respiratory infection 06/14/2015  . Hypertension, poor control 06/08/2015  . B12 deficiency 06/08/2015  . ESSENTIAL HYPERTENSION, BENIGN 01/29/2011  . ABNORMAL ELECTROCARDIOGRAM 01/29/2011     Past Surgical History  Procedure Laterality Date  . Abdominal hysterectomy    . Back surgery       Current Outpatient Rx  Name  Route  Sig  Dispense  Refill  . amLODipine (NORVASC) 5 MG tablet   Oral   Take 1 tablet (5 mg total) by mouth daily.   90 tablet   0   . benazepril (LOTENSIN) 40 MG tablet   Oral   Take 1 tablet (40 mg total) by mouth daily.   90 tablet   0   . fluticasone (FLONASE) 50 MCG/ACT nasal spray   Each Nare   Place 2 sprays into both nostrils daily. Patient taking differently: Place 2 sprays into both nostrils daily as needed for rhinitis.    16 g   6   . guaiFENesin-codeine 100-10 MG/5ML syrup   Oral   Take 5 mLs by mouth every 6 (six) hours as needed for cough. Patient not taking: Reported on  09/09/2015   120 mL   0   . meclizine (ANTIVERT) 25 MG tablet   Oral   Take 1 tablet (25 mg total) by mouth 3 (three) times daily as needed for dizziness or nausea.   30 tablet   1      Allergies Review of patient's allergies indicates no known allergies.   Family History  Problem Relation Age of Onset  . Hypertension Mother   . Cancer Brother     Colon  . Hypertension Brother   . Diabetes Daughter   . Cancer Maternal Aunt     Breast  . Cancer Paternal Aunt     Breast    Social History Social History  Substance Use Topics  . Smoking status: Never Smoker   . Smokeless tobacco: Never Used  . Alcohol Use: No    Review of Systems  Constitutional:   No fever or chills. No weight changes Eyes:   No blurry vision or double vision.  ENT:   No sore throat. Cardiovascular:   No chest pain. Respiratory:   No dyspnea or cough. Gastrointestinal:   Negative for abdominal pain, vomiting and diarrhea.  No BRBPR or melena. Genitourinary:   Negative for dysuria, urinary retention, bloody urine, or difficulty urinating. Musculoskeletal:   Negative for back pain. No joint swelling or pain. Skin:   Negative for rash. Neurological:  Generalized headache with dizziness. Generalized weakness. No focal weakness or paresthesias.  Psychiatric:  No anxiety or depression.   Endocrine:  No hot/cold intolerance, changes in energy, or sleep difficulty.  10-point ROS otherwise negative.  ____________________________________________   PHYSICAL EXAM:  VITAL SIGNS: ED Triage Vitals  Enc Vitals Group     BP 09/09/15 0920 167/89 mmHg     Pulse Rate 09/09/15 0920 83     Resp 09/09/15 0920 18     Temp 09/09/15 0920 97.9 F (36.6 C)     Temp Source 09/09/15 0920 Oral     SpO2 09/09/15 0920 100 %     Weight 09/09/15 0920 170 lb (77.111 kg)     Height 09/09/15 0920 5' (1.524 m)     Head Cir --      Peak Flow --      Pain Score 09/09/15 0920 6     Pain Loc --      Pain Edu? --       Excl. in [place]? --      Constitutional:   Alert and oriented. No acute distress.. Eyes:   No scleral icterus. No conjunctival pallor. PERRL. EOMI. There is left beating nystagmus on left lateral gaze that extinguishes after about 6 beats ENT   Head:   Normocephalic and atraumatic. TMs normal   Nose:   No congestion/rhinnorhea. No septal hematoma   Mouth/Throat:   MMM, no pharyngeal erythema. No peritonsillar mass. No uvula shift.   Neck:   No stridor. No SubQ emphysema. No meningismus. Hematological/Lymphatic/Immunilogical:   No cervical lymphadenopathy. Cardiovascular:   RRR. Normal and symmetric distal pulses are present in all extremities. No murmurs, rubs, or gallops. Respiratory:   Normal respiratory effort without tachypnea nor retractions. Breath sounds are clear and equal bilaterally. No wheezes/rales/rhonchi. Gastrointestinal:   Soft and nontender. No distention. There is no CVA tenderness.  No rebound, rigidity, or guarding. Genitourinary:   deferred Musculoskeletal:   Nontender with normal range of motion in all extremities. No joint effusions.  No lower extremity tenderness.  No edema. Neurologic:   Normal speech and language.  CN 2-10 normal. Motor grossly intact. No pronator drift.  Normal gait. Symptoms are reproduced an exaggerated by rapid change in position with a Dix-Hallpike maneuver No gross focal neurologic deficits are appreciated.  Skin:    Skin is warm, dry and intact. No rash noted.  No petechiae, purpura, or bullae. Psychiatric:   Mood and affect are normal. Speech and behavior are normal. Patient exhibits appropriate insight and judgment.  ____________________________________________    LABS (pertinent positives/negatives) (all labs ordered are listed, but only abnormal results are displayed) Labs Reviewed  BASIC METABOLIC PANEL - Abnormal; Notable for the following:    Glucose, Bld 104 (*)    All other components within normal limits   URINALYSIS COMPLETEWITH MICROSCOPIC ([facility] ONLY) - Abnormal; Notable for the following:    Color, Urine STRAW (*)    APPearance CLEAR (*)    Bacteria, UA RARE (*)    Squamous Epithelial / LPF 0-5 (*)    All other components within normal limits  CBC  CBG MONITORING, ED   ____________________________________________   EKG  Interpreted by me Normal sinus rhythm rate of 67, normal axis and intervals. Altered criteria for LVH in the high lateral leads. Normal ST segments and T waves.  ____________________________________________    RADIOLOGY  CT head unremarkable CT angiogram of the brain unremarkable  ____________________________________________   PROCEDURES   ____________________________________________  INITIAL IMPRESSION / ASSESSMENT AND PLAN / ED COURSE  Pertinent labs & imaging results that were available during my care of the patient were reviewed by me and considered in my medical decision making (see chart for details).  Patient presents with a week's worth of dizziness which by exam appears to be peripheral vertigo. She has a family history of cerebral aneurysms. Her neurologic exam is inconsistent but 2 different providers and at different times she seems to have some weakness in different areas of the body. This is most likely effort related as it does improve with repeated attempts and encouragement. Due to these findings and her history, we'll check a CT of the head and if unremarkable get a CT angiogram of the head. We'll also give him meclizine and fluids to help with her vertigo.  ----------------------------------------- 1:29 PM on 09/09/2015 -----------------------------------------  Workup unremarkable. The patient feels much better after meclizine. At this point I have very low suspicion for stroke intracranial hemorrhage or meningitis or encephalitis. This does appear to be a peripheral vertigo and we will keep her on meclizine and have her follow up  with primary care.     ____________________________________________   FINAL CLINICAL IMPRESSION(S) / ED DIAGNOSES  Final diagnoses:  Vertigo      Carrie Mew, MD 09/09/15 1330

## 2015-09-09 NOTE — ED Notes (Signed)
Discussed with patient that we are waiting on CT scan results

## 2015-09-15 ENCOUNTER — Ambulatory Visit: Payer: 59 | Admitting: Family Medicine

## 2015-09-27 ENCOUNTER — Ambulatory Visit: Payer: 59 | Admitting: Family Medicine

## 2015-10-07 ENCOUNTER — Ambulatory Visit: Payer: 59 | Admitting: Family Medicine

## 2015-10-31 ENCOUNTER — Encounter: Payer: Self-pay | Admitting: Family Medicine

## 2015-10-31 ENCOUNTER — Ambulatory Visit (INDEPENDENT_AMBULATORY_CARE_PROVIDER_SITE_OTHER): Payer: 59 | Admitting: Family Medicine

## 2015-10-31 VITALS — BP 120/72 | HR 83 | Temp 98.7°F | Resp 16 | Ht 60.0 in | Wt 178.5 lb

## 2015-10-31 DIAGNOSIS — R011 Cardiac murmur, unspecified: Secondary | ICD-10-CM | POA: Diagnosis not present

## 2015-10-31 DIAGNOSIS — I10 Essential (primary) hypertension: Secondary | ICD-10-CM | POA: Diagnosis not present

## 2015-10-31 MED ORDER — AMLODIPINE BESYLATE 5 MG PO TABS: 5.0000 mg | ORAL_TABLET | Freq: Every day | ORAL | Status: DC

## 2015-10-31 MED ORDER — BENAZEPRIL HCL 40 MG PO TABS: 40.0000 mg | ORAL_TABLET | Freq: Every day | ORAL | Status: DC

## 2015-10-31 NOTE — Progress Notes (Signed)
Name: Joyce Simpson   MRN: [id number]    DOB: 1961-03-10   Date:10/31/2015       Progress Note  Subjective  Chief Complaint  Chief Complaint  Patient presents with  . Hypertension    3 months follow up    Hypertension This is a chronic problem. The problem is controlled. Pertinent negatives include no blurred vision, chest pain, headaches, orthopnea, palpitations or shortness of breath. Past treatments include calcium channel blockers and ACE inhibitors. There is no history of kidney disease, CAD/MI or CVA.      Past Medical History  Diagnosis Date  . Hypertension   . Migraines     Past Surgical History  Procedure Laterality Date  . Abdominal hysterectomy    . Back surgery      Family History  Problem Relation Age of Onset  . Hypertension Mother   . Cancer Brother     Colon  . Hypertension Brother   . Diabetes Daughter   . Cancer Maternal Aunt     Breast  . Cancer Paternal Aunt     Breast    Social History   Social History  . Marital Status: Married    Spouse Name: N/A  . Number of Children: N/A  . Years of Education: N/A   Occupational History  . Not on file.   Social History Main Topics  . Smoking status: Never Smoker   . Smokeless tobacco: Never Used  . Alcohol Use: No  . Drug Use: No  . Sexual Activity: Not on file   Other Topics Concern  . Not on file   Social History Narrative     Current outpatient prescriptions:  .  amLODipine (NORVASC) 5 MG tablet, Take 1 tablet (5 mg total) by mouth daily., Disp: 90 tablet, Rfl: 0 .  benazepril (LOTENSIN) 40 MG tablet, Take 1 tablet (40 mg total) by mouth daily., Disp: 90 tablet, Rfl: 0 .  fluticasone (FLONASE) 50 MCG/ACT nasal spray, Place 2 sprays into both nostrils daily. (Patient taking differently: Place 2 sprays into both nostrils daily as needed for rhinitis. ), Disp: 16 g, Rfl: 6 .  meclizine (ANTIVERT) 25 MG tablet, Take 1 tablet (25 mg total) by mouth 3 (three) times daily as needed for  dizziness or nausea., Disp: 30 tablet, Rfl: 1  No Known Allergies  Review of Systems  Constitutional: Negative for fever, chills and weight loss.  Eyes: Negative for blurred vision and double vision.  Respiratory: Negative for shortness of breath.   Cardiovascular: Negative for chest pain, palpitations and orthopnea.  Neurological: Negative for headaches.    Objective  Filed Vitals:   10/31/15 1512  BP: 120/72  Pulse: 83  Temp: 98.7 F (37.1 C)  TempSrc: Oral  Resp: 16  Height: 5' (1.524 m)  Weight: 178 lb 8 oz (80.967 kg)  SpO2: 97%    Physical Exam  Constitutional: She is oriented to person, place, and time and well-developed, well-nourished, and in no distress.  Cardiovascular: Normal rate and regular rhythm.   Murmur heard.  Systolic murmur is present with a grade of 2/6  Pulmonary/Chest: Effort normal and breath sounds normal.  Musculoskeletal: She exhibits no edema.  Neurological: She is alert and oriented to person, place, and time.  Nursing note and vitals reviewed.   Assessment & Plan  1. Essential hypertension, benign BP controlled on therapy. - amLODipine (NORVASC) 5 MG tablet; Take 1 tablet (5 mg total) by mouth daily.  Dispense: 90 tablet; Refill: 0 -  benazepril (LOTENSIN) 40 MG tablet; Take 1 tablet (40 mg total) by mouth daily.  Dispense: 90 tablet; Refill: 0  2. Cardiac murmur  - Ambulatory referral to Cardiology   [facility] A. [facility] 10/31/2015 3:35 PM

## 2016-01-12 ENCOUNTER — Ambulatory Visit: Payer: 59 | Admitting: Cardiovascular Disease

## 2016-01-26 ENCOUNTER — Telehealth: Payer: Self-pay | Admitting: Family Medicine

## 2016-01-26 DIAGNOSIS — I10 Essential (primary) hypertension: Secondary | ICD-10-CM

## 2016-01-26 NOTE — Telephone Encounter (Signed)
Patient had appointment scheduled for 01-31-16 but chose to reschedule for 03-08-16 until she see her heart doctor. However she will be completely out of Amlodipine 5mg  and benazepril 40mg  before her next visit. Is it possible to give her another refill [facility] to [facility]).

## 2016-01-27 MED ORDER — BENAZEPRIL HCL 40 MG PO TABS: 40.0000 mg | ORAL_TABLET | Freq: Every day | ORAL | Status: DC

## 2016-01-27 MED ORDER — AMLODIPINE BESYLATE 5 MG PO TABS: 5.0000 mg | ORAL_TABLET | Freq: Every day | ORAL | Status: DC

## 2016-01-27 NOTE — Telephone Encounter (Signed)
Medication has been refilled and sent to [facility] °

## 2016-01-31 ENCOUNTER — Ambulatory Visit: Payer: 59 | Admitting: Family Medicine

## 2016-02-13 DIAGNOSIS — J0100 Acute maxillary sinusitis, unspecified: Secondary | ICD-10-CM | POA: Diagnosis not present

## 2016-03-02 ENCOUNTER — Ambulatory Visit (INDEPENDENT_AMBULATORY_CARE_PROVIDER_SITE_OTHER): Payer: 59 | Admitting: Cardiovascular Disease

## 2016-03-02 ENCOUNTER — Encounter: Payer: Self-pay | Admitting: Cardiovascular Disease

## 2016-03-02 VITALS — BP 142/78 | HR 67 | Ht 60.0 in | Wt 178.0 lb

## 2016-03-02 DIAGNOSIS — I10 Essential (primary) hypertension: Secondary | ICD-10-CM

## 2016-03-02 DIAGNOSIS — E785 Hyperlipidemia, unspecified: Secondary | ICD-10-CM | POA: Diagnosis not present

## 2016-03-02 DIAGNOSIS — R011 Cardiac murmur, unspecified: Secondary | ICD-10-CM

## 2016-03-02 NOTE — Assessment & Plan Note (Signed)
Total cholesterol 180,numbers are not particularly bad No strong family history of coronary artery disease or PAD No strong indication for cholesterol medication at this time

## 2016-03-02 NOTE — Progress Notes (Signed)
Patient ID: Joyce Simpson, female    DOB: Oct 29, 1961, 55 y.o.   MRN: [id number]  HPI Comments: Ms. Valiant is a pleasant 55 year old woman with history of hypertension, previous echo stress test 2012 showing no ischemia, who presents by referral from Dr. Manuella Ghazi for heart murmur.  She reports that she feels well, denies any shortness of breath on exertion, no significant leg edema She does have a family history of cerebral aneurysm  She has never smoked before, no diabetes Lab work reviewed with her showing total cholesterol 180  She is active, works a busy job in Scientist, research (life sciences) and receiving No regular exercise program  She reports that she has never been told that she has a heart murmur before  EKG today, she is normal sinus rhythm with rate 67 bpm, T-wave abnormality in V4 and V5 T-wave abnormality dates back to 2009   No Known Allergies  Current Outpatient Prescriptions on File Prior to Visit  Medication Sig Dispense Refill  . amLODipine (NORVASC) 5 MG tablet Take 1 tablet (5 mg total) by mouth daily. 30 tablet 0  . benazepril (LOTENSIN) 40 MG tablet Take 1 tablet (40 mg total) by mouth daily. 30 tablet 0  . fluticasone (FLONASE) 50 MCG/ACT nasal spray Place 2 sprays into both nostrils daily. (Patient taking differently: Place 2 sprays into both nostrils daily as needed for rhinitis. ) 16 g 6  . meclizine (ANTIVERT) 25 MG tablet Take 1 tablet (25 mg total) by mouth 3 (three) times daily as needed for dizziness or nausea. 30 tablet 1   No current facility-administered medications on file prior to visit.    Past Medical History  Diagnosis Date  . Hypertension   . Migraines     Past Surgical History  Procedure Laterality Date  . Abdominal hysterectomy    . Back surgery      Social History  reports that she has never smoked. She has never used smokeless tobacco. She reports that she does not drink alcohol or use illicit drugs.  Family History family history includes AAA  (abdominal aortic aneurysm) in her brother and mother; Cancer in her brother, maternal aunt, and paternal aunt; Diabetes in her daughter; Hypertension in her brother and mother.   Review of Systems  Constitutional: Negative.   Respiratory: Negative.   Cardiovascular: Negative.   Gastrointestinal: Negative.   Musculoskeletal: Negative.   Neurological: Negative.   Hematological: Negative.   Psychiatric/Behavioral: Negative.   All other systems reviewed and are negative.   BP 142/78 mmHg  Pulse 67  Ht 5' (1.524 m)  Wt 178 lb (80.74 kg)  BMI 34.76 kg/m2  Physical Exam  Constitutional: She is oriented to person, place, and time. She appears well-developed and well-nourished.  HENT:  Head: Normocephalic.  Nose: Nose normal.  Mouth/Throat: Oropharynx is clear and moist.  Eyes: Conjunctivae are normal. Pupils are equal, round, and reactive to light.  Neck: Normal range of motion. Neck supple. No JVD present.  Cardiovascular: Normal rate, regular rhythm and intact distal pulses.  Exam reveals no gallop and no friction rub.   Murmur heard.  Systolic murmur is present with a grade of 1/6  Pulmonary/Chest: Effort normal and breath sounds normal. No respiratory distress. She has no wheezes. She has no rales. She exhibits no tenderness.  Abdominal: Soft. Bowel sounds are normal. She exhibits no distension. There is no tenderness.  Musculoskeletal: Normal range of motion. She exhibits no edema or tenderness.  Lymphadenopathy:    She has no  cervical adenopathy.  Neurological: She is alert and oriented to person, place, and time. Coordination normal.  Skin: Skin is warm and dry. No rash noted. No erythema.  Psychiatric: She has a normal mood and affect. Her behavior is normal. Judgment and thought content normal.

## 2016-03-02 NOTE — Assessment & Plan Note (Signed)
Blood pressure is well controlled on today's visit. No changes made to the medications. 

## 2016-03-02 NOTE — Assessment & Plan Note (Signed)
On careful clinical exam today, there is possibly a very low-grade murmur consistent with aortic valve sclerosis with no stenosis. Given the low intensity, likely of very little clinical concern. She has had previous echocardiogram several years ago ( stress echo). No further testing needed at this time as murmurs very low-grade, no clinical features concerning for significant valve pathology

## 2016-03-02 NOTE — Patient Instructions (Signed)
You are doing well. No medication changes were made.  Please call us if you have new issues that need to be addressed before your next appt.    

## 2016-03-08 ENCOUNTER — Ambulatory Visit: Payer: 59 | Admitting: Family Medicine

## 2016-04-09 ENCOUNTER — Ambulatory Visit (INDEPENDENT_AMBULATORY_CARE_PROVIDER_SITE_OTHER): Payer: 59 | Admitting: Family Medicine

## 2016-04-09 ENCOUNTER — Encounter: Payer: Self-pay | Admitting: Family Medicine

## 2016-04-09 VITALS — BP 140/71 | HR 73 | Temp 98.6°F | Resp 16 | Ht 60.0 in | Wt 176.7 lb

## 2016-04-09 DIAGNOSIS — I10 Essential (primary) hypertension: Secondary | ICD-10-CM

## 2016-04-09 MED ORDER — BENAZEPRIL HCL 40 MG PO TABS: 40.0000 mg | ORAL_TABLET | Freq: Every day | ORAL | Status: DC

## 2016-04-09 MED ORDER — AMLODIPINE BESYLATE 5 MG PO TABS: 5.0000 mg | ORAL_TABLET | Freq: Every day | ORAL | Status: DC

## 2016-04-09 NOTE — Progress Notes (Signed)
Name: Joyce Simpson   MRN: [id number]    DOB: 10/26/1961   Date:04/09/2016       Progress Note  Subjective  Chief Complaint  Chief Complaint  Patient presents with  . Medication Refill    amlodipine 5 mg / benazepril 40 mg     Hypertension This is a chronic problem. The problem is controlled. Pertinent negatives include no blurred vision, chest pain, headaches, orthopnea, palpitations or shortness of breath. Past treatments include calcium channel blockers and ACE inhibitors. There is no history of kidney disease, CAD/MI or CVA.      Past Medical History  Diagnosis Date  . Hypertension   . Migraines     Past Surgical History  Procedure Laterality Date  . Abdominal hysterectomy    . Back surgery      Family History  Problem Relation Age of Onset  . Hypertension Mother   . AAA (abdominal aortic aneurysm) Mother   . Cancer Brother     Colon  . Hypertension Brother   . Diabetes Daughter   . Cancer Maternal Aunt     Breast  . Cancer Paternal Aunt     Breast  . AAA (abdominal aortic aneurysm) Brother     Social History   Social History  . Marital Status: Married    Spouse Name: N/A  . Number of Children: N/A  . Years of Education: N/A   Occupational History  . Not on file.   Social History Main Topics  . Smoking status: Never Smoker   . Smokeless tobacco: Never Used  . Alcohol Use: No  . Drug Use: No  . Sexual Activity: Not on file   Other Topics Concern  . Not on file   Social History Narrative     Current outpatient prescriptions:  .  amLODipine (NORVASC) 5 MG tablet, Take 1 tablet (5 mg total) by mouth daily., Disp: 30 tablet, Rfl: 0 .  benazepril (LOTENSIN) 40 MG tablet, Take 1 tablet (40 mg total) by mouth daily., Disp: 30 tablet, Rfl: 0 .  fluticasone (FLONASE) 50 MCG/ACT nasal spray, Place 2 sprays into both nostrils daily. (Patient taking differently: Place 2 sprays into both nostrils daily as needed for rhinitis. ), Disp: 16 g, Rfl: 6 .   meclizine (ANTIVERT) 25 MG tablet, Take 1 tablet (25 mg total) by mouth 3 (three) times daily as needed for dizziness or nausea., Disp: 30 tablet, Rfl: 1  No Known Allergies   Review of Systems  Constitutional: Negative for fever, chills and weight loss.  Eyes: Negative for blurred vision and double vision.  Respiratory: Negative for shortness of breath.   Cardiovascular: Negative for chest pain, palpitations and orthopnea.  Neurological: Negative for headaches.    Objective  Filed Vitals:   04/09/16 1534  BP: 140/71  Pulse: 73  Temp: 98.6 F (37 C)  TempSrc: Oral  Resp: 16  Height: 5' (1.524 m)  Weight: 176 lb 11.2 oz (80.151 kg)  SpO2: 98%    Physical Exam  Constitutional: She is oriented to person, place, and time and well-developed, well-nourished, and in no distress.  Cardiovascular: Normal rate and regular rhythm.   Murmur heard.  Systolic murmur is present with a grade of 2/6  Pulmonary/Chest: Effort normal and breath sounds normal.  Musculoskeletal: She exhibits no edema.  Neurological: She is alert and oriented to person, place, and time.  Nursing note and vitals reviewed.    Assessment & Plan  1. Essential hypertension, benign Blood pressure  is stable, advised to keep blood pressure logs and follow-up in 3 months. - amLODipine (NORVASC) 5 MG tablet; Take 1 tablet (5 mg total) by mouth daily.  Dispense: 90 tablet; Refill: 0 - benazepril (LOTENSIN) 40 MG tablet; Take 1 tablet (40 mg total) by mouth daily.  Dispense: 90 tablet; Refill: 0   Bryana Froemming [facility] 04/09/2016 3:42 PM

## 2016-05-29 ENCOUNTER — Ambulatory Visit
Admission: RE | Admit: 2016-05-29 | Discharge: 2016-05-29 | Disposition: A | Discharge status: Home / Self Care | Payer: 59 | Source: Ambulatory Visit | Attending: Family Medicine | Admitting: Family Medicine

## 2016-05-29 ENCOUNTER — Ambulatory Visit (INDEPENDENT_AMBULATORY_CARE_PROVIDER_SITE_OTHER): Payer: 59 | Admitting: Family Medicine

## 2016-05-29 ENCOUNTER — Encounter: Payer: Self-pay | Admitting: Family Medicine

## 2016-05-29 VITALS — BP 136/77 | HR 69 | Temp 98.4°F | Resp 15 | Ht 60.0 in | Wt 178.4 lb

## 2016-05-29 DIAGNOSIS — M545 Low back pain, unspecified: Secondary | ICD-10-CM

## 2016-05-29 DIAGNOSIS — R200 Anesthesia of skin: Secondary | ICD-10-CM

## 2016-05-29 DIAGNOSIS — R202 Paresthesia of skin: Secondary | ICD-10-CM

## 2016-05-29 LAB — POCT GLYCOSYLATED HEMOGLOBIN (HGB A1C): Hemoglobin A1C: 5.8

## 2016-05-29 MED ORDER — METAXALONE 800 MG PO TABS: 800.0000 mg | ORAL_TABLET | Freq: Three times a day (TID) | ORAL | Status: DC

## 2016-05-29 NOTE — Progress Notes (Signed)
Name: Joyce Simpson   MRN: [id number]    DOB: January 06, 1961   Date:05/29/2016       Progress Note  Subjective  Chief Complaint  Chief Complaint  Patient presents with  . Back Pain  . Tingling    Fingers on rt hand    Back Pain This is a new problem. The current episode started more than 1 month ago. The pain is present in the lumbar spine. The pain does not radiate. The pain is at a severity of 4/10 (has been worse before). Pertinent negatives include no bladder incontinence or bowel incontinence. Treatments tried: Applied OTC pain patches, Aleve, and Ibuprofen.    Tingling in Right Hand Fingers:  Present for over a month, distal third of each finger affected, feels like its 'numb', pain in right arm with lying down and has to shake her arm to relieve the uncomfortable sensation. Also noticed her grip is not as strong as before. She has history of neck surgery 3-4 years ago.   Past Medical History  Diagnosis Date  . Hypertension   . Migraines     Past Surgical History  Procedure Laterality Date  . Abdominal hysterectomy    . Back surgery      Family History  Problem Relation Age of Onset  . Hypertension Mother   . AAA (abdominal aortic aneurysm) Mother   . Cancer Brother     Colon  . Hypertension Brother   . Diabetes Daughter   . Cancer Maternal Aunt     Breast  . Cancer Paternal Aunt     Breast  . AAA (abdominal aortic aneurysm) Brother     Social History   Social History  . Marital Status: Married    Spouse Name: N/A  . Number of Children: N/A  . Years of Education: N/A   Occupational History  . Not on file.   Social History Main Topics  . Smoking status: Never Smoker   . Smokeless tobacco: Never Used  . Alcohol Use: No  . Drug Use: No  . Sexual Activity: Not on file   Other Topics Concern  . Not on file   Social History Narrative     Current outpatient prescriptions:  .  amLODipine (NORVASC) 5 MG tablet, Take 1 tablet (5 mg total) by mouth  daily., Disp: 90 tablet, Rfl: 0 .  benazepril (LOTENSIN) 40 MG tablet, Take 1 tablet (40 mg total) by mouth daily., Disp: 90 tablet, Rfl: 0 .  fluticasone (FLONASE) 50 MCG/ACT nasal spray, Place 2 sprays into both nostrils daily. (Patient taking differently: Place 2 sprays into both nostrils daily as needed for rhinitis. ), Disp: 16 g, Rfl: 6 .  meclizine (ANTIVERT) 25 MG tablet, Take 1 tablet (25 mg total) by mouth 3 (three) times daily as needed for dizziness or nausea. (Patient not taking: Reported on 05/29/2016), Disp: 30 tablet, Rfl: 1  No Known Allergies   Review of Systems  Gastrointestinal: Negative for bowel incontinence.  Genitourinary: Negative for bladder incontinence.  Musculoskeletal: Positive for back pain.    Objective  Filed Vitals:   05/29/16 1022  BP: 136/77  Pulse: 69  Temp: 98.4 F (36.9 C)  TempSrc: Oral  Resp: 15  Height: 5' (1.524 m)  Weight: 178 lb 6.4 oz (80.922 kg)  SpO2: 98%    Physical Exam  Musculoskeletal:       Lumbar back: She exhibits tenderness, pain and spasm.       Back:  Right hand: She exhibits normal range of motion, no tenderness, normal capillary refill and no swelling. Normal sensation noted. Normal strength noted.  Nursing note and vitals reviewed.     Assessment & Plan  1. Acute left-sided low back pain without sciatica Felt to be more likely a result of muscle tightness, we'll start on muscle relaxant therapy, plain x-rays if no relief. - metaxalone (SKELAXIN) 800 MG tablet; Take 1 tablet (800 mg total) by mouth 3 (three) times daily.  Dispense: 30 tablet; Refill: 0  2. Numbness and tingling in right hand Multiple possible etiologies, obtain x-rays to rule out arthritis, lab workup to rule out other potential possibilities. - CBC with Differential - Comprehensive Metabolic Panel (CMET) - TSH - B12 - DG Hand Complete Right; Future - POCT HgB A1C   Tyeisha Dinan [facility] 05/29/2016 10:27 AM

## 2016-05-30 LAB — COMPREHENSIVE METABOLIC PANEL
A/G RATIO: 1.6 (ref 1.2–2.2)
ALBUMIN: 4.3 g/dL (ref 3.5–5.5)
ALT: 25 IU/L (ref 0–32)
AST: 19 IU/L (ref 0–40)
Alkaline Phosphatase: 102 IU/L (ref 39–117)
BILIRUBIN TOTAL: 0.3 mg/dL (ref 0.0–1.2)
BUN / CREAT RATIO: 15 (ref 9–23)
BUN: 10 mg/dL (ref 6–24)
CHLORIDE: 101 mmol/L (ref 96–106)
CO2: 23 mmol/L (ref 18–29)
Calcium: 9.4 mg/dL (ref 8.7–10.2)
Creatinine, Ser: 0.67 mg/dL (ref 0.57–1.00)
GFR calc non Af Amer: 99 mL/min/{1.73_m2} (ref >59)
GFR, EST AFRICAN AMERICAN: 114 mL/min/{1.73_m2} (ref >59)
GLOBULIN, TOTAL: 2.7 g/dL (ref 1.5–4.5)
Glucose: 76 mg/dL (ref 65–99)
POTASSIUM: 4.1 mmol/L (ref 3.5–5.2)
SODIUM: 141 mmol/L (ref 134–144)
TOTAL PROTEIN: 7 g/dL (ref 6.0–8.5)

## 2016-05-30 LAB — CBC WITH DIFFERENTIAL/PLATELET
BASOS: 0 %
Basophils Absolute: 0 10*3/uL (ref 0.0–0.2)
EOS (ABSOLUTE): 0.1 10*3/uL (ref 0.0–0.4)
EOS: 2 %
HEMATOCRIT: 41.5 % (ref 34.0–46.6)
Hemoglobin: 13.9 g/dL (ref 11.1–15.9)
Immature Grans (Abs): 0 10*3/uL (ref 0.0–0.1)
Immature Granulocytes: 0 %
LYMPHS ABS: 2 10*3/uL (ref 0.7–3.1)
Lymphs: 37 %
MCH: 27.9 pg (ref 26.6–33.0)
MCHC: 33.5 g/dL (ref 31.5–35.7)
MCV: 83 fL (ref 79–97)
MONOS ABS: 0.6 10*3/uL (ref 0.1–0.9)
Monocytes: 11 %
Neutrophils Absolute: 2.7 10*3/uL (ref 1.4–7.0)
Neutrophils: 50 %
Platelets: 260 10*3/uL (ref 150–379)
RBC: 4.99 x10E6/uL (ref 3.77–5.28)
RDW: 13.6 % (ref 12.3–15.4)
WBC: 5.4 10*3/uL (ref 3.4–10.8)

## 2016-05-30 LAB — TSH: TSH: 1.23 u[IU]/mL (ref 0.450–4.500)

## 2016-05-30 LAB — VITAMIN B12: Vitamin B-12: 666 pg/mL (ref 211–946)

## 2016-07-12 ENCOUNTER — Encounter: Payer: Self-pay | Admitting: Family Medicine

## 2016-07-12 ENCOUNTER — Ambulatory Visit (INDEPENDENT_AMBULATORY_CARE_PROVIDER_SITE_OTHER): Payer: 59 | Admitting: Family Medicine

## 2016-07-12 VITALS — BP 130/70 | HR 65 | Temp 98.0°F | Resp 16 | Ht 60.0 in | Wt 180.0 lb

## 2016-07-12 DIAGNOSIS — L309 Dermatitis, unspecified: Secondary | ICD-10-CM | POA: Insufficient documentation

## 2016-07-12 DIAGNOSIS — I10 Essential (primary) hypertension: Secondary | ICD-10-CM | POA: Diagnosis not present

## 2016-07-12 MED ORDER — MOMETASONE FUROATE 0.1 % EX CREA: 1.0000 "application " | TOPICAL_CREAM | Freq: Every day | CUTANEOUS | Status: DC

## 2016-07-12 MED ORDER — AMLODIPINE BESYLATE 5 MG PO TABS: 5.0000 mg | ORAL_TABLET | Freq: Every day | ORAL | Status: DC

## 2016-07-12 MED ORDER — BENAZEPRIL HCL 40 MG PO TABS: 40.0000 mg | ORAL_TABLET | Freq: Every day | ORAL | Status: DC

## 2016-07-12 NOTE — Progress Notes (Signed)
Name: Joyce Simpson   MRN: [id number]    DOB: 1961/05/12   Date:07/12/2016       Progress Note  Subjective  Chief Complaint  Chief Complaint  Patient presents with  . Breast Problem    notice red bumps on right breast for 3 days    HPI  Red Lesions around right Breast: Present for 3 days, woke up Monday morning and discovered that she had three 'knots' on her right breast, no itching or bleeding and no pain. She was concerned and made an appointment to get checked out. She reports the lesions are getting better, has not tried any treatment  Hypertension: Pt. Presents for medication refills on Amlodipine and Benazepril, has history of Hypertension for 20 years, on meds and controlled.    Past Medical History  Diagnosis Date  . Hypertension   . Migraines     Past Surgical History  Procedure Laterality Date  . Abdominal hysterectomy    . Back surgery      Family History  Problem Relation Age of Onset  . Hypertension Mother   . AAA (abdominal aortic aneurysm) Mother   . Cancer Brother     Colon  . Hypertension Brother   . Diabetes Daughter   . Cancer Maternal Aunt     Breast  . Cancer Paternal Aunt     Breast  . AAA (abdominal aortic aneurysm) Brother     Social History   Social History  . Marital Status: Married    Spouse Name: N/A  . Number of Children: N/A  . Years of Education: N/A   Occupational History  . Not on file.   Social History Main Topics  . Smoking status: Never Smoker   . Smokeless tobacco: Never Used  . Alcohol Use: No  . Drug Use: No  . Sexual Activity: Not on file   Other Topics Concern  . Not on file   Social History Narrative     Current outpatient prescriptions:  .  amLODipine (NORVASC) 5 MG tablet, Take 1 tablet (5 mg total) by mouth daily., Disp: 90 tablet, Rfl: 0 .  benazepril (LOTENSIN) 40 MG tablet, Take 1 tablet (40 mg total) by mouth daily., Disp: 90 tablet, Rfl: 0 .  fluticasone (FLONASE) 50 MCG/ACT nasal spray,  Place 2 sprays into both nostrils daily. (Patient taking differently: Place 2 sprays into both nostrils daily as needed for rhinitis. ), Disp: 16 g, Rfl: 6 .  meclizine (ANTIVERT) 25 MG tablet, Take 1 tablet (25 mg total) by mouth 3 (three) times daily as needed for dizziness or nausea., Disp: 30 tablet, Rfl: 1 .  metaxalone (SKELAXIN) 800 MG tablet, Take 1 tablet (800 mg total) by mouth 3 (three) times daily., Disp: 30 tablet, Rfl: 0  No Known Allergies   Review of Systems  Constitutional: Negative for fever.  Eyes: Negative for blurred vision.  Cardiovascular: Negative for chest pain.  Skin: Positive for itching.  Neurological: Negative for headaches.    Objective  Filed Vitals:   07/12/16 0841  BP: 130/70  Pulse: 65  Temp: 98 F (36.7 C)  TempSrc: Oral  Resp: 16  Height: 5' (1.524 m)  Weight: 180 lb (81.647 kg)  SpO2: 99%    Physical Exam  Constitutional: She is oriented to person, place, and time and well-developed, well-nourished, and in no distress.  HENT:  Head: Normocephalic and atraumatic.  Cardiovascular: Normal rate and regular rhythm.   Pulmonary/Chest: Effort normal and breath sounds normal.  Erythematous, maculo-papular pruritic circumscribed lesions on the superior right breast.  Abdominal: Soft. Bowel sounds are normal.  Musculoskeletal: She exhibits no edema.  Neurological: She is alert and oriented to person, place, and time.  Psychiatric: Mood, memory, affect and judgment normal.  Nursing note and vitals reviewed.    Assessment & Plan  1. Essential hypertension, benign BP at goal, refills provided - amLODipine (NORVASC) 5 MG tablet; Take 1 tablet (5 mg total) by mouth daily.  Dispense: 90 tablet; Refill: 0 - benazepril (LOTENSIN) 40 MG tablet; Take 1 tablet (40 mg total) by mouth daily.  Dispense: 90 tablet; Refill: 0  2. Dermatitis By exam, has dermatitis of the skin of right breast, we'll start on topical steroid therapy. - mometasone  (ELOCON) 0.1 % cream; Apply 1 application topically daily.  Dispense: 45 g; Refill: 0 .  Camile Esters [facility] 07/12/2016 8:55 AM

## 2016-09-24 DIAGNOSIS — H524 Presbyopia: Secondary | ICD-10-CM | POA: Diagnosis not present

## 2016-09-25 IMAGING — CT CT ANGIO HEAD
3 of 8 series · 17 of 47 positions shown · IV contrast (APPLIED)
Comparison: Noncontrast head CT 4044 hours today. Intracranial MRA
05/05/2013.

CLINICAL DATA: 54-year-old female with dizziness and headache for 1
week. Initial encounter.

EXAM:
CT ANGIOGRAPHY HEAD
TECHNIQUE: Multidetector CT imaging of the head was performed using the
standard protocol during bolus administration of intravenous
contrast. Multiplanar CT image reconstructions and MIPs were
obtained to evaluate the vascular anatomy.
CONTRAST:  75mL OMNIPAQUE IOHEXOL 350 MG/ML SOLN

[Series 4: cta head · axial · 0.46mm/px · z∈[+500,+630]mm · 11 of 159 slices shown]
[im 14/159  brain]
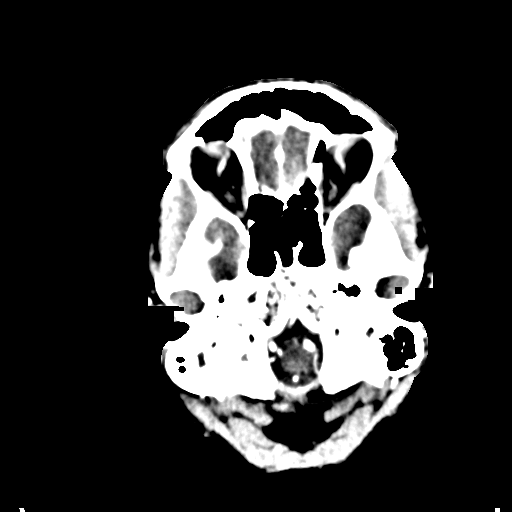
[im 27/159  bone]
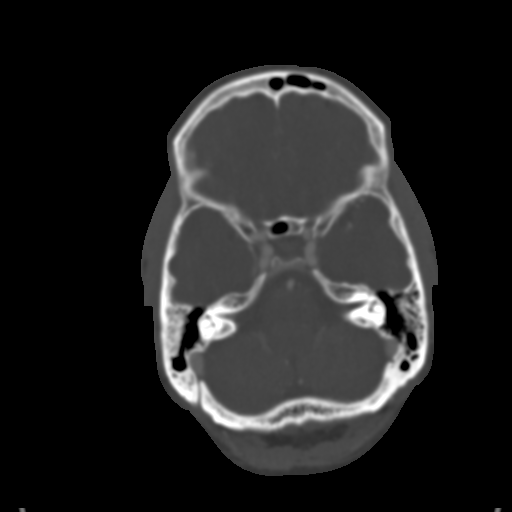
[im 40/159  brain]
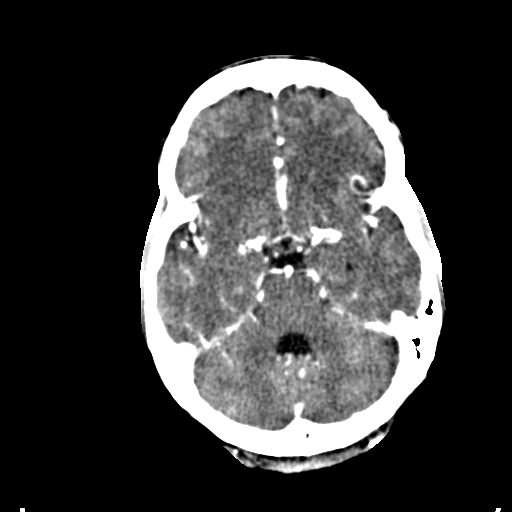
[im 53/159  bone]
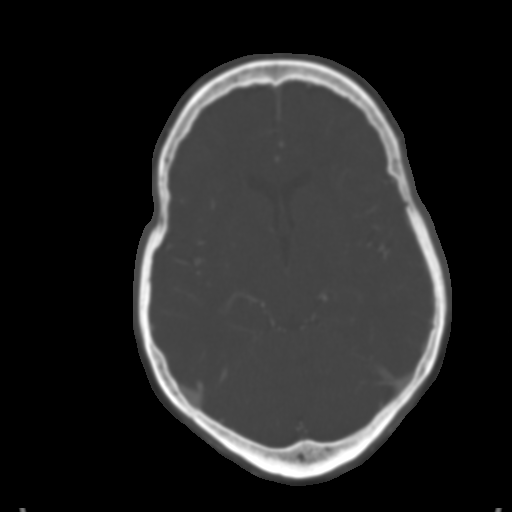
[im 66/159  brain]
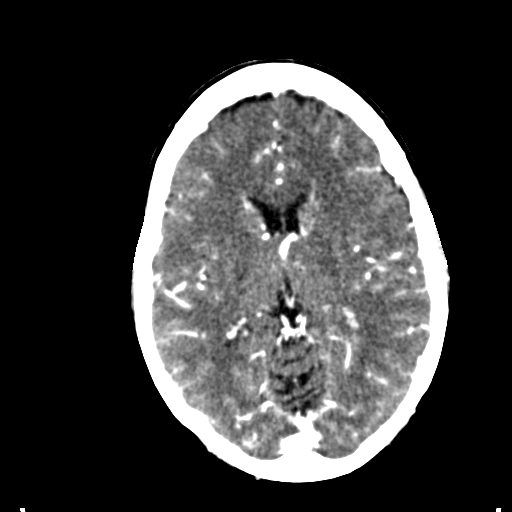
[im 80/159  bone]
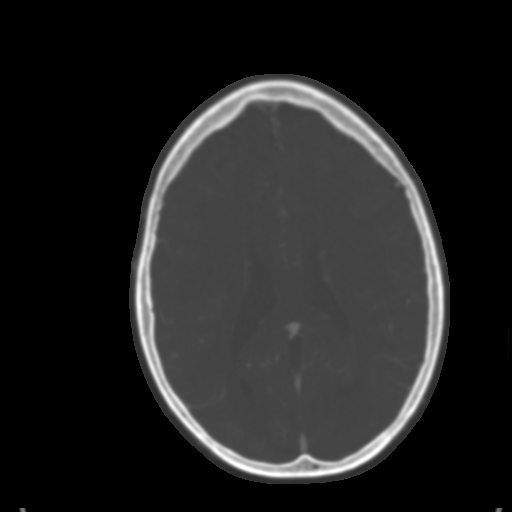
[im 93/159  brain]
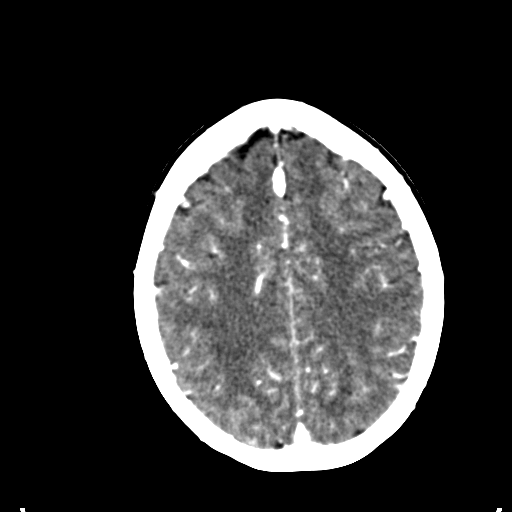
[im 106/159  bone]
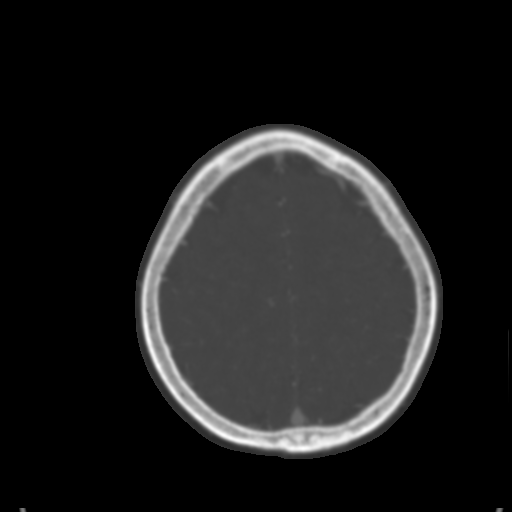
[im 119/159  brain]
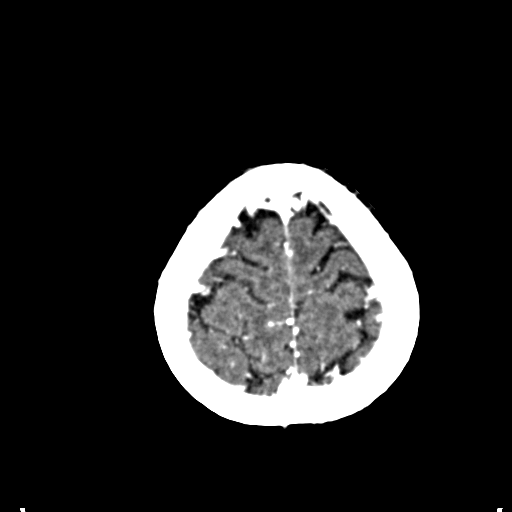
[im 132/159  bone]
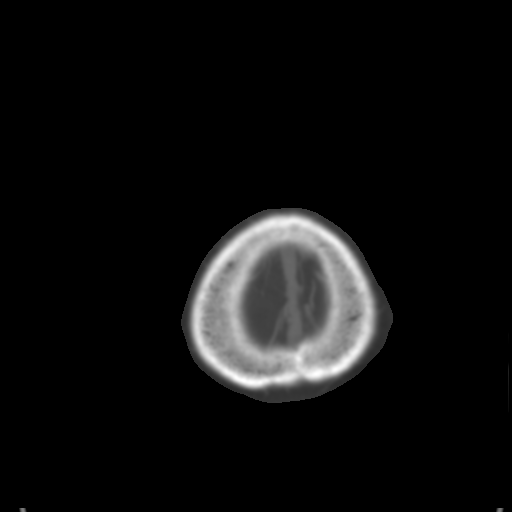
[im 145/159  brain]
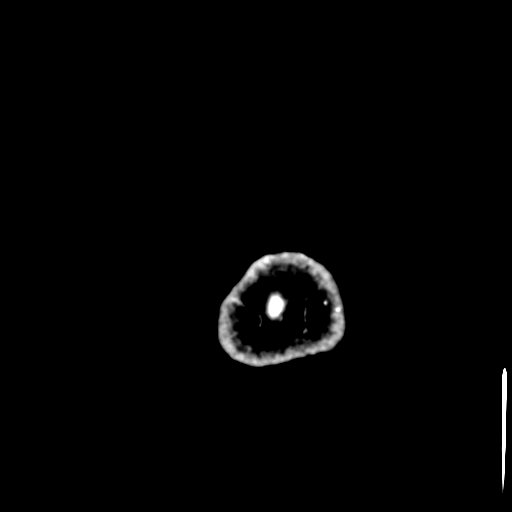

[Series 7: cor thin · coronal · 0.41mm/px · 3 of 196 slices shown]
[im 56/196  brain]
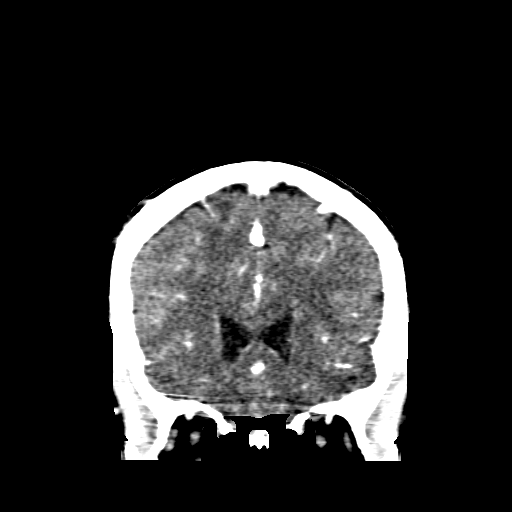
[im 84/196  brain]
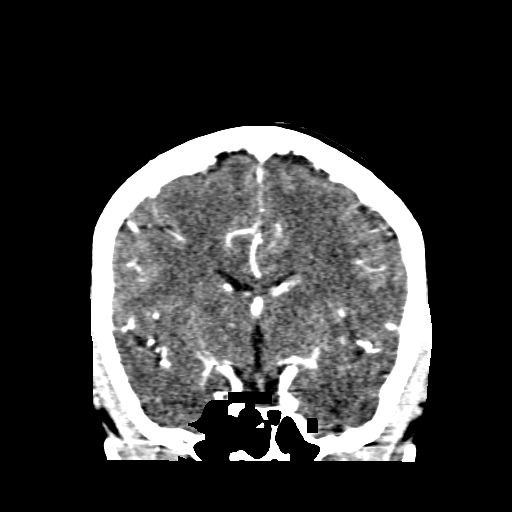
[im 112/196  brain]
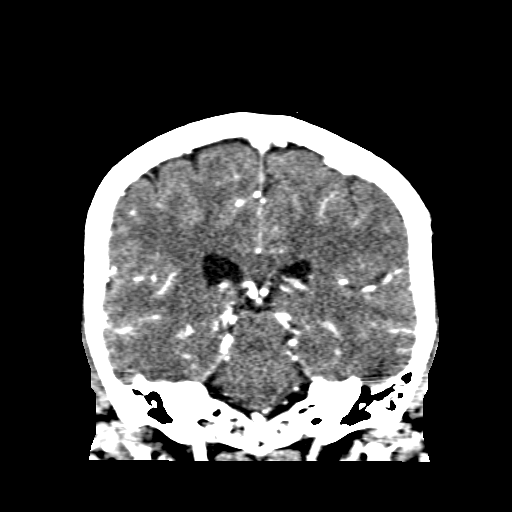

[Series 10: sag thin · sagittal · 0.32mm/px · 3 of 157 slices shown]
[im 32/157  brain]
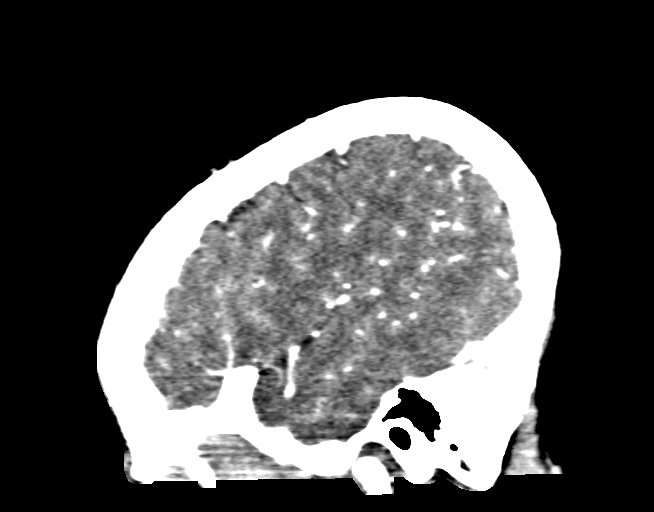
[im 63/157  brain]
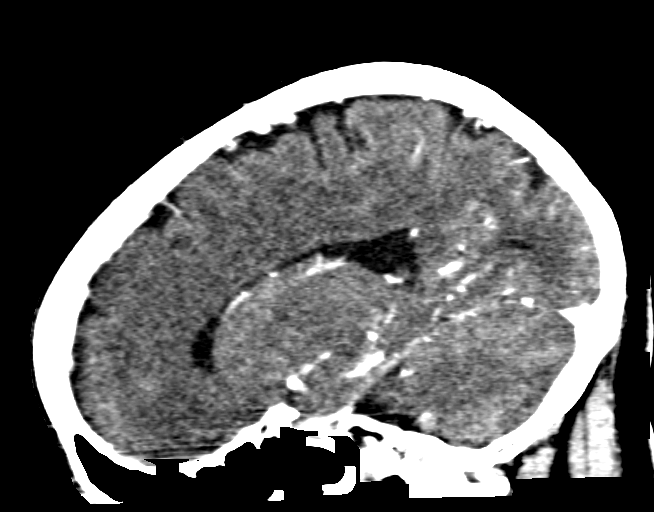
[im 94/157  brain]
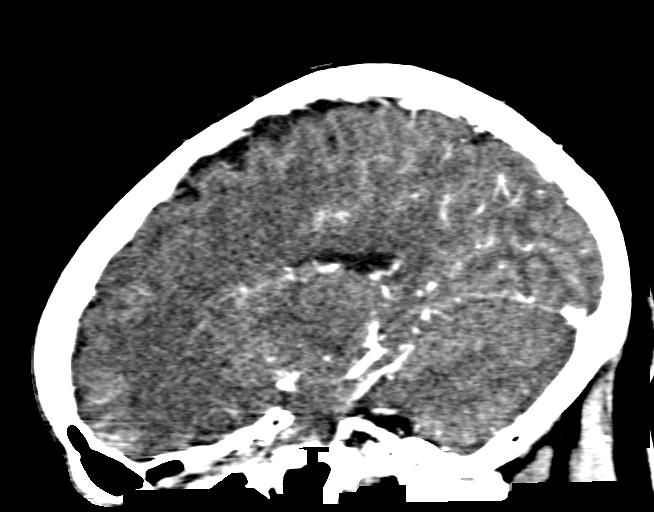

[17 of 47 positions shown; findings below may reference images not displayed]

FINDINGS: Posterior circulation: Patent distal vertebral arteries, the left is
mildly dominant. Normal PICA origins. Patent vertebrobasilar
junction. No basilar artery stenosis. AICA, SCA (duplicated on the
left), and PCA origins are within normal limits. Diminutive
posterior communicating arteries. Bilateral PCA branches are within
normal limits.

Anterior circulation: Patent distal cervical ICAs. Both ICA siphons
are patent without atherosclerosis or stenosis. Normal posterior
communicating artery origins. Normal carotid termini. Normal MCA and
ACA origins. Fenestrated right ACA A1/ anterior communicating
artery, better demonstrated in 5422. Bilateral ACA branches are
within normal limits. Left MCA M1 segment, bifurcation, and left MCA
branches are within normal limits. Right MCA M1 segment,
bifurcation, and right MCA branches are within normal limits.

Venous sinuses: Patent.

Anatomic variants: Mildly dominant distal left vertebral artery.
Fenestrated right ACA A1/ anterior communicating artery.

Delayed phase:No abnormal enhancement identified. No intracranial
mass effect and stable gray-white matter differentiation since 4044
hours today. Visualized paranasal sinuses and mastoids are clear. No
acute osseous abnormality identified. Visualized orbits and scalp
soft tissues are within normal limits.
IMPRESSION: 1. Negative intracranial CTA, stable compared to the 5422
intracranial MRA.
2. Stable and negative CT appearance of the brain.

## 2016-11-14 ENCOUNTER — Telehealth: Payer: Self-pay | Admitting: Family Medicine

## 2016-11-14 NOTE — Telephone Encounter (Signed)
Pt needs refill on Benazepril and Amlodipine to be sent to [place]. Pt does not have a upcoming appt.

## 2016-11-14 NOTE — Telephone Encounter (Signed)
Patient should schedule an appointment for medication refills. 

## 2016-11-20 ENCOUNTER — Telehealth: Payer: Self-pay

## 2016-11-20 DIAGNOSIS — I10 Essential (primary) hypertension: Secondary | ICD-10-CM

## 2016-11-20 MED ORDER — BENAZEPRIL HCL 40 MG PO TABS: 40.0000 mg | ORAL_TABLET | Freq: Every day | ORAL | 0 refills | Status: DC

## 2016-11-20 MED ORDER — AMLODIPINE BESYLATE 5 MG PO TABS: 5.0000 mg | ORAL_TABLET | Freq: Every day | ORAL | 0 refills | Status: DC

## 2016-11-20 NOTE — Telephone Encounter (Signed)
Medication has been refilled and sent to Joyce Simpson patient has appointment scheduled for 12/20/2016

## 2016-11-21 ENCOUNTER — Ambulatory Visit: Payer: 59 | Admitting: Family Medicine

## 2016-12-03 DIAGNOSIS — H6983 Other specified disorders of Eustachian tube, bilateral: Secondary | ICD-10-CM | POA: Diagnosis not present

## 2016-12-03 DIAGNOSIS — J019 Acute sinusitis, unspecified: Secondary | ICD-10-CM | POA: Diagnosis not present

## 2016-12-14 ENCOUNTER — Emergency Department
Admission: EM | Admit: 2016-12-14 | Discharge: 2016-12-14 | Disposition: A | Discharge status: Home / Self Care | Payer: 59 | Attending: Emergency Medicine | Admitting: Emergency Medicine

## 2016-12-14 DIAGNOSIS — I10 Essential (primary) hypertension: Secondary | ICD-10-CM | POA: Insufficient documentation

## 2016-12-14 DIAGNOSIS — Z79899 Other long term (current) drug therapy: Secondary | ICD-10-CM | POA: Insufficient documentation

## 2016-12-14 DIAGNOSIS — R21 Rash and other nonspecific skin eruption: Secondary | ICD-10-CM | POA: Diagnosis not present

## 2016-12-14 DIAGNOSIS — L509 Urticaria, unspecified: Secondary | ICD-10-CM | POA: Insufficient documentation

## 2016-12-14 MED ORDER — FAMOTIDINE 20 MG PO TABS
40.0000 mg | ORAL_TABLET | Freq: Once | ORAL | Status: AC
  Administered 2016-12-14: 40 mg via ORAL
  Filled 2016-12-14: qty 2

## 2016-12-14 MED ORDER — METHYLPREDNISOLONE SODIUM SUCC 125 MG IJ SOLR
125.0000 mg | Freq: Once | INTRAMUSCULAR | Status: DC
  Filled 2016-12-14: qty 2

## 2016-12-14 MED ORDER — DIPHENHYDRAMINE HCL 25 MG PO CAPS
25.0000 mg | ORAL_CAPSULE | Freq: Once | ORAL | Status: AC
  Administered 2016-12-14: 25 mg via ORAL
  Filled 2016-12-14: qty 1

## 2016-12-14 MED ORDER — METHYLPREDNISOLONE SODIUM SUCC 125 MG IJ SOLR
125.0000 mg | Freq: Once | INTRAMUSCULAR | Status: AC
  Administered 2016-12-14: 125 mg via INTRAMUSCULAR

## 2016-12-14 MED ORDER — FAMOTIDINE 20 MG PO TABS: 20.0000 mg | ORAL_TABLET | Freq: Two times a day (BID) | ORAL | 0 refills | Status: DC

## 2016-12-14 MED ORDER — PREDNISONE 10 MG (21) PO TBPK: 10.0000 mg | ORAL_TABLET | Freq: Every day | ORAL | 0 refills | Status: DC

## 2016-12-14 NOTE — ED Notes (Signed)
Pt stating around 1530 today pt stating that she started to break out in "welts." Pt has redness in her left AC , underneath her bilateral breasts, and wear her underwear are. Pt stating that she does not remember being around anything different or new. Pt stating, "I don't know if it from using to much Clorox." Pt stating that she is very itchy. Pt stating she took a Benadryl at 1705.

## 2016-12-14 NOTE — ED Triage Notes (Signed)
Pt reports she developed a rash today on her neck and abd. No Shob noted

## 2016-12-14 NOTE — ED Provider Notes (Signed)
[facility] Emergency Department Provider Note  ____________________________________________  Time seen: Approximately 10:14 PM  I have reviewed the triage vital signs and the nursing notes.   HISTORY  Chief Complaint Rash   HPI Joyce Simpson is a 55 y.o. female presenting with diffuse hives since 3:00 this afternoon. Patient states that she had [employer] for lunch and no new snacks this afternoon. She states that she used "a little more Clorox" in her laundry than usual. However, no new changes in her activities of daily living. Patient denies shortness of breath, angioedema, abdominal pain, nausea or vomiting. She describes hives as pruritic. She denies prior episodes of hives. She has taken Benadryl which has not improved hives.  Past Medical History:  Diagnosis Date  . Hypertension   . Migraines     Patient Active Problem List   Diagnosis Date Noted  . Dermatitis 07/12/2016  . Acute low back pain without sciatica 05/29/2016  . Numbness and tingling in right hand 05/29/2016  . Cardiac murmur 10/31/2015  . Hyperlipidemia 06/14/2015  . Upper respiratory infection 06/14/2015  . Hypertension, poor control 06/08/2015  . B12 deficiency 06/08/2015  . ESSENTIAL HYPERTENSION, BENIGN 01/29/2011  . ABNORMAL ELECTROCARDIOGRAM 01/29/2011    Past Surgical History:  Procedure Laterality Date  . ABDOMINAL HYSTERECTOMY    . BACK SURGERY      Prior to Admission medications   Medication Sig Start Date End Date Taking? Authorizing Provider  amLODipine (NORVASC) 5 MG tablet Take 1 tablet (5 mg total) by mouth daily. 11/20/16   Roselee Nova, MD  benazepril (LOTENSIN) 40 MG tablet Take 1 tablet (40 mg total) by mouth daily. 11/20/16   Roselee Nova, MD  famotidine (PEPCID) 20 MG tablet Take 1 tablet (20 mg total) by mouth 2 (two) times daily. 12/14/16 12/14/17  Lannie Fields, PA-C  fluticasone (FLONASE) 50 MCG/ACT nasal spray Place 2 sprays into both  nostrils daily. Patient taking differently: Place 2 sprays into both nostrils daily as needed for rhinitis.  06/14/15   Roselee Nova, MD  meclizine (ANTIVERT) 25 MG tablet Take 1 tablet (25 mg total) by mouth 3 (three) times daily as needed for dizziness or nausea. 09/09/15   Carrie Mew, MD  metaxalone (SKELAXIN) 800 MG tablet Take 1 tablet (800 mg total) by mouth 3 (three) times daily. 05/29/16   Roselee Nova, MD  mometasone (ELOCON) 0.1 % cream Apply 1 application topically daily. 07/12/16   Roselee Nova, MD  predniSONE (STERAPRED UNI-PAK 21 TAB) 10 MG (21) TBPK tablet Take 1 tablet (10 mg total) by mouth daily. Take 6 tabs the first day, take 5 tabs the second day, take 4 tabs the third day, take 3 tabs the fourth day, take 2 tabs the fifth day, take 1 tab the sixth day. 12/14/16   Lannie Fields, PA-C    Allergies Patient has no known allergies.  Family History  Problem Relation Age of Onset  . Hypertension Mother   . AAA (abdominal aortic aneurysm) Mother   . Cancer Brother     Colon  . Hypertension Brother   . Diabetes Daughter   . Cancer Maternal Aunt     Breast  . Cancer Paternal Aunt     Breast  . AAA (abdominal aortic aneurysm) Brother     Social History Social History  Substance Use Topics  . Smoking status: Never Smoker  . Smokeless tobacco: Never Used  . Alcohol  use No    Review of Systems  Constitutional: Negative for fever/chills Respiratory: Negative for shortness of breath. Musculoskeletal: Negative for pain. Skin: Has hives Neurological: Negative for headaches, focal weakness or numbness. ____________________________________________   PHYSICAL EXAM:  VITAL SIGNS: ED Triage Vitals  Enc Vitals Group     BP 12/14/16 2123 (!) 173/80     Pulse Rate 12/14/16 2123 94     Resp 12/14/16 2123 18     Temp 12/14/16 2123 98.6 F (37 C)     Temp Source 12/14/16 2123 Oral     SpO2 12/14/16 2123 98 %     Weight 12/14/16 2115 170 lb (77.1 kg)      Height 12/14/16 2115 5' (1.524 m)     Head Circumference --      Peak Flow --      Pain Score 12/14/16 2115 0     Pain Loc --      Pain Edu? --      Excl. in [place]? --      Constitutional: Alert and oriented. Scratching. Mouth/Throat: Mucous membranes are moist.   Cardiovascular: Normal sinus rhythm. No murmurs gallops or rubs to auscultation. Respiratory: Normal respiratory effort.  No retractions. Lungs CTAB. Musculoskeletal: FROM throughout. Neurologic:  Normal speech and language. No gross focal neurologic deficits are appreciated. Skin:  Patient has diffuse hives localized to her arms, abdomen, back and genitalia.  ____________________________________________   LABS (all labs ordered are listed, but only abnormal results are displayed)  Labs Reviewed - No data to display   PROCEDURES  Procedure(s) performed:  Solumedrol Famotidine   ____________________________________________   INITIAL IMPRESSION / ASSESSMENT AND PLAN / ED COURSE  Clinical Course     Pertinent labs & imaging results that were available during my care of the patient were reviewed by me and considered in my medical decision making (see chart for details).  Assessment and Plan: Patient patient was given famotidine, Solu-Medrol and Benadryl in the emergency department. Itching improved minutes after Solu-Medrol injection. Patient was monitored for 15 minutes. She was discharged with a prednisone pack and famotidine. She was advised to use Benadryl with aforementioned prescriptions. Patient does not have dyspnea, hypotension or angioedema, decreasing suspicion for anaphylaxis. Strict return precautions were given. All patient questions were answered. ____________________________________________   FINAL CLINICAL IMPRESSION(S) / ED DIAGNOSES  Final diagnoses:  Hives    New Prescriptions   FAMOTIDINE (PEPCID) 20 MG TABLET    Take 1 tablet (20 mg total) by mouth 2 (two) times daily.   PREDNISONE  (STERAPRED UNI-PAK 21 TAB) 10 MG (21) TBPK TABLET    Take 1 tablet (10 mg total) by mouth daily. Take 6 tabs the first day, take 5 tabs the second day, take 4 tabs the third day, take 3 tabs the fourth day, take 2 tabs the fifth day, take 1 tab the sixth day.    Note:  This document was prepared using Dragon voice recognition software and may include unintentional dictation errors.    Lannie Fields, PA-C 12/14/16 2228    Hinda Kehr, MD 12/15/16 [phone]

## 2016-12-20 ENCOUNTER — Ambulatory Visit: Payer: 59 | Admitting: Family Medicine

## 2016-12-25 ENCOUNTER — Ambulatory Visit: Payer: 59 | Admitting: Family Medicine

## 2017-01-07 ENCOUNTER — Ambulatory Visit: Payer: 59 | Admitting: Family Medicine

## 2017-01-20 ENCOUNTER — Emergency Department
Admission: EM | Admit: 2017-01-20 | Discharge: 2017-01-20 | Disposition: A | Discharge status: Home / Self Care | Payer: 59 | Attending: Emergency Medicine | Admitting: Emergency Medicine

## 2017-01-20 ENCOUNTER — Encounter: Payer: Self-pay | Admitting: Emergency Medicine

## 2017-01-20 DIAGNOSIS — R112 Nausea with vomiting, unspecified: Secondary | ICD-10-CM

## 2017-01-20 DIAGNOSIS — I10 Essential (primary) hypertension: Secondary | ICD-10-CM | POA: Diagnosis not present

## 2017-01-20 DIAGNOSIS — B349 Viral infection, unspecified: Secondary | ICD-10-CM | POA: Diagnosis not present

## 2017-01-20 DIAGNOSIS — Z79899 Other long term (current) drug therapy: Secondary | ICD-10-CM | POA: Diagnosis not present

## 2017-01-20 LAB — URINALYSIS, ROUTINE W REFLEX MICROSCOPIC
BILIRUBIN URINE: NEGATIVE
Bacteria, UA: NONE SEEN
GLUCOSE, UA: NEGATIVE mg/dL
KETONES UR: 20 mg/dL — AB
Leukocytes, UA: NEGATIVE
NITRITE: NEGATIVE
PH: 5 (ref 5.0–8.0)
Protein, ur: 30 mg/dL — AB
Specific Gravity, Urine: 1.028 (ref 1.005–1.030)
WBC UA: NONE SEEN WBC/hpf (ref 0–5)

## 2017-01-20 LAB — CBC WITH DIFFERENTIAL/PLATELET
BASOS ABS: 0 10*3/uL (ref 0–0.1)
Basophils Relative: 0 %
Eosinophils Absolute: 0 10*3/uL (ref 0–0.7)
Eosinophils Relative: 0 %
HEMATOCRIT: 43.8 % (ref 35.0–47.0)
Hemoglobin: 15.1 g/dL (ref 12.0–16.0)
LYMPHS ABS: 0.5 10*3/uL — AB (ref 1.0–3.6)
LYMPHS PCT: 7 %
MCH: 28.4 pg (ref 26.0–34.0)
MCHC: 34.5 g/dL (ref 32.0–36.0)
MCV: 82.2 fL (ref 80.0–100.0)
MONO ABS: 0.5 10*3/uL (ref 0.2–0.9)
Monocytes Relative: 6 %
NEUTROS ABS: 7 10*3/uL — AB (ref 1.4–6.5)
Neutrophils Relative %: 87 %
Platelets: 266 10*3/uL (ref 150–440)
RBC: 5.33 MIL/uL — AB (ref 3.80–5.20)
RDW: 13.5 % (ref 11.5–14.5)
WBC: 8.1 10*3/uL (ref 3.6–11.0)

## 2017-01-20 LAB — BASIC METABOLIC PANEL
Anion gap: 8 (ref 5–15)
BUN: 12 mg/dL (ref 6–20)
CO2: 25 mmol/L (ref 22–32)
Calcium: 9.3 mg/dL (ref 8.9–10.3)
Chloride: 106 mmol/L (ref 101–111)
Creatinine, Ser: 0.82 mg/dL (ref 0.44–1.00)
GFR calc Af Amer: >60 mL/min (ref >60)
GFR calc non Af Amer: >60 mL/min (ref >60)
GLUCOSE: 105 mg/dL — AB (ref 65–99)
POTASSIUM: 3.4 mmol/L — AB (ref 3.5–5.1)
Sodium: 139 mmol/L (ref 135–145)

## 2017-01-20 LAB — INFLUENZA PANEL BY PCR (TYPE A & B)
Influenza A By PCR: NEGATIVE
Influenza B By PCR: NEGATIVE

## 2017-01-20 MED ORDER — ONDANSETRON 4 MG PO TBDP: 4.0000 mg | ORAL_TABLET | Freq: Three times a day (TID) | ORAL | 0 refills | Status: DC | PRN

## 2017-01-20 MED ORDER — LOPERAMIDE HCL 2 MG PO TABS: 2.0000 mg | ORAL_TABLET | Freq: Four times a day (QID) | ORAL | 0 refills | Status: DC | PRN

## 2017-01-20 MED ORDER — KETOROLAC TROMETHAMINE 30 MG/ML IJ SOLN
30.0000 mg | Freq: Once | INTRAMUSCULAR | Status: AC
  Administered 2017-01-20: 30 mg via INTRAVENOUS
  Filled 2017-01-20: qty 1

## 2017-01-20 MED ORDER — ONDANSETRON HCL 4 MG/2ML IJ SOLN
4.0000 mg | Freq: Once | INTRAMUSCULAR | Status: AC
  Administered 2017-01-20: 4 mg via INTRAVENOUS
  Filled 2017-01-20: qty 2

## 2017-01-20 MED ORDER — SODIUM CHLORIDE 0.9 % IV BOLUS (SEPSIS)
1000.0000 mL | Freq: Once | INTRAVENOUS | Status: AC
  Administered 2017-01-20: 1000 mL via INTRAVENOUS

## 2017-01-20 NOTE — ED Notes (Signed)
Pt up to restroom to get urine sample.

## 2017-01-20 NOTE — Discharge Instructions (Signed)
Please drink plenty of fluids, obtain plain of rest. Please take nausea medication as needed, as prescribed. Please take Tylenol or Motrin as needed for fever or discomfort. Return to the emergency department for any worsening abdominal pain, unable to keep down fluids due to vomiting, or any other symptom personally concerning to her self. Otherwise please follow-up with her primary care doctor in the next several days for recheck if still feeling ill.

## 2017-01-20 NOTE — ED Provider Notes (Signed)
[facility] Emergency Department Provider Note  Time seen: 9:20 PM  I have reviewed the triage vital signs and the nursing notes.   HISTORY  Chief Complaint Emesis    HPI Joyce Simpson is a 56 y.o. female with a past medical history of hypertension, migraines, presents to the emergency department for nausea, vomiting, diarrhea. According to the patient for the past 2 days she has been very nauseated with frequent episodes of vomiting, ride and Saturday had significant amounts of diarrhea although states that has stopped. Patient continues to feel nauseated has not been able to keep down anything today besides water so she came to the emergency department for evaluation. Patient states mild abdominal cramping with body aches. Denies any known fever. Denies any cough or congestion.  Past Medical History:  Diagnosis Date  . Hypertension   . Migraines     Patient Active Problem List   Diagnosis Date Noted  . Dermatitis 07/12/2016  . Acute low back pain without sciatica 05/29/2016  . Numbness and tingling in right hand 05/29/2016  . Cardiac murmur 10/31/2015  . Hyperlipidemia 06/14/2015  . Upper respiratory infection 06/14/2015  . Hypertension, poor control 06/08/2015  . B12 deficiency 06/08/2015  . ESSENTIAL HYPERTENSION, BENIGN 01/29/2011  . ABNORMAL ELECTROCARDIOGRAM 01/29/2011    Past Surgical History:  Procedure Laterality Date  . ABDOMINAL HYSTERECTOMY    . BACK SURGERY      Prior to Admission medications   Medication Sig Start Date End Date Taking? Authorizing Provider  amLODipine (NORVASC) 5 MG tablet Take 1 tablet (5 mg total) by mouth daily. 11/20/16   Roselee Nova, MD  benazepril (LOTENSIN) 40 MG tablet Take 1 tablet (40 mg total) by mouth daily. 11/20/16   Roselee Nova, MD  famotidine (PEPCID) 20 MG tablet Take 1 tablet (20 mg total) by mouth 2 (two) times daily. 12/14/16 12/14/17  Lannie Fields, PA-C  fluticasone (FLONASE) 50  MCG/ACT nasal spray Place 2 sprays into both nostrils daily. Patient taking differently: Place 2 sprays into both nostrils daily as needed for rhinitis.  06/14/15   Roselee Nova, MD  meclizine (ANTIVERT) 25 MG tablet Take 1 tablet (25 mg total) by mouth 3 (three) times daily as needed for dizziness or nausea. 09/09/15   Carrie Mew, MD  metaxalone (SKELAXIN) 800 MG tablet Take 1 tablet (800 mg total) by mouth 3 (three) times daily. 05/29/16   Roselee Nova, MD  mometasone (ELOCON) 0.1 % cream Apply 1 application topically daily. 07/12/16   Roselee Nova, MD  predniSONE (STERAPRED UNI-PAK 21 TAB) 10 MG (21) TBPK tablet Take 1 tablet (10 mg total) by mouth daily. Take 6 tabs the first day, take 5 tabs the second day, take 4 tabs the third day, take 3 tabs the fourth day, take 2 tabs the fifth day, take 1 tab the sixth day. 12/14/16   Lannie Fields, PA-C    No Known Allergies  Family History  Problem Relation Age of Onset  . Hypertension Mother   . AAA (abdominal aortic aneurysm) Mother   . Cancer Brother     Colon  . Hypertension Brother   . Diabetes Daughter   . AAA (abdominal aortic aneurysm) Brother   . Cancer Maternal Aunt     Breast  . Cancer Paternal Aunt     Breast    Social History Social History  Substance Use Topics  . Smoking status: Never Smoker  .  Smokeless tobacco: Never Used  . Alcohol use No    Review of Systems Constitutional: Negative for fever. Cardiovascular: Negative for chest pain. Respiratory: Negative for shortness of breath.Negative for cough. Gastrointestinal: Mild diffuse abdominal cramping. Positive for nausea vomiting and diarrhea. Genitourinary: Negative for dysuria. Neurological: Negative for headache 10-point ROS otherwise negative.  ____________________________________________   PHYSICAL EXAM:  VITAL SIGNS: ED Triage Vitals  Enc Vitals Group     BP 01/20/17 2039 (!) 176/78     Pulse Rate 01/20/17 2039 79     Resp 01/20/17  2039 16     Temp 01/20/17 2039 98.9 F (37.2 C)     Temp Source 01/20/17 2039 Oral     SpO2 01/20/17 2039 97 %     Weight 01/20/17 2038 180 lb (81.6 kg)     Height 01/20/17 2038 5' (1.524 m)     Head Circumference --      Peak Flow --      Pain Score 01/20/17 2038 9     Pain Loc --      Pain Edu? --      Excl. in [place]? --     Constitutional: Alert and oriented. Well appearing and in no distress. Eyes: Normal exam ENT   Head: Normocephalic and atraumatic.   Mouth/Throat: Mucous membranes are moist. Cardiovascular: Normal rate, regular rhythm. No murmur Respiratory: Normal respiratory effort without tachypnea nor retractions. Breath sounds are clear Gastrointestinal: Soft, mild diffuse tenderness, no rebound or guarding. No area of focal tenderness. Musculoskeletal: Nontender with normal range of motion in all extremities.  Neurologic:  Normal speech and language. No gross focal neurologic deficits Skin:  Skin is warm, dry and intact.  Psychiatric: Mood and affect are normal.   ____________________________________________   INITIAL IMPRESSION / ASSESSMENT AND PLAN / ED COURSE  Pertinent labs & imaging results that were available during my care of the patient were reviewed by me and considered in my medical decision making (see chart for details).  Patient presents the emergency department 2 days of nausea vomiting and diarrhea. Mild abdominal cramping, mild body aches. Denies any fever or cough or congestion. Patient's symptoms are very suggestive of viral gastroenteritis such as normal virus. Influenza swab is pending. We will dose IV fluids, Zofran and Toradol for symptomatic relief. Patient agreeable to plan.  ____________________________________________   FINAL CLINICAL IMPRESSION(S) / ED DIAGNOSES  Viral syndrome    Harvest Dark, MD 01/21/17 [phone]

## 2017-01-20 NOTE — ED Triage Notes (Signed)
Pt states that both Friday and Saturday night all night she has been experiencing vomiting and diarrhea. Pt states that she feels like she has no energy at this time. Pt states that other than a little bit of water today she has had nothing staying down x2 days. Pt states that she does have generalized body aches. Pt is ambulatory to triage with NAD notes at this time.

## 2017-03-29 ENCOUNTER — Ambulatory Visit (INDEPENDENT_AMBULATORY_CARE_PROVIDER_SITE_OTHER): Payer: 59 | Admitting: Family Medicine

## 2017-03-29 ENCOUNTER — Encounter: Payer: Self-pay | Admitting: Family Medicine

## 2017-03-29 VITALS — BP 140/83 | HR 75 | Temp 98.3°F | Resp 16 | Ht 60.0 in | Wt 176.3 lb

## 2017-03-29 DIAGNOSIS — E7800 Pure hypercholesterolemia, unspecified: Secondary | ICD-10-CM | POA: Diagnosis not present

## 2017-03-29 DIAGNOSIS — J3089 Other allergic rhinitis: Secondary | ICD-10-CM | POA: Diagnosis not present

## 2017-03-29 DIAGNOSIS — I10 Essential (primary) hypertension: Secondary | ICD-10-CM | POA: Diagnosis not present

## 2017-03-29 LAB — LIPID PANEL
CHOL/HDL RATIO: 2.7 ratio (ref <5.0)
Cholesterol: 184 mg/dL (ref <200)
HDL: 68 mg/dL (ref >50)
LDL Cholesterol: 107 mg/dL — ABNORMAL HIGH (ref <100)
TRIGLYCERIDES: 43 mg/dL (ref <150)
VLDL: 9 mg/dL (ref <30)

## 2017-03-29 MED ORDER — AMLODIPINE BESYLATE 5 MG PO TABS: 5.0000 mg | ORAL_TABLET | Freq: Every day | ORAL | 0 refills | Status: DC

## 2017-03-29 MED ORDER — FLUTICASONE PROPIONATE 50 MCG/ACT NA SUSP: 2.0000 | Freq: Every day | NASAL | 3 refills | Status: DC

## 2017-03-29 MED ORDER — BENAZEPRIL HCL 40 MG PO TABS: 40.0000 mg | ORAL_TABLET | Freq: Every day | ORAL | 0 refills | Status: DC

## 2017-03-29 NOTE — Progress Notes (Signed)
Name: Joyce Simpson   MRN: [id number]    DOB: June 15, 1961   Date:03/29/2017       Progress Note  Subjective  Chief Complaint  Chief Complaint  Patient presents with  . Medication Refill  . Labs Only    Fasting  . Referral    Mammogram    Hypertension  This is a chronic problem. The problem is unchanged. The problem is controlled. Pertinent negatives include no blurred vision, headaches, orthopnea or palpitations. Past treatments include calcium channel blockers and ACE inhibitors. There is no history of kidney disease, CAD/MI or CVA.  Hyperlipidemia  This is a chronic problem. The problem is uncontrolled. Recent lipid tests were reviewed and are high (elevated LDL). She is currently on no antihyperlipidemic treatment.   Patient is also requesting refill for Flonase nasal spray for spring time allergies, mostly due to pollen and grass. She has sinus pressure, sneezing, and watery and itchy eyes. She takes Flonase for these symptoms.    Past Medical History:  Diagnosis Date  . Hypertension   . Migraines     Past Surgical History:  Procedure Laterality Date  . ABDOMINAL HYSTERECTOMY    . BACK SURGERY      Family History  Problem Relation Age of Onset  . Hypertension Mother   . AAA (abdominal aortic aneurysm) Mother   . Cancer Brother     Colon  . Hypertension Brother   . Diabetes Daughter   . AAA (abdominal aortic aneurysm) Brother   . Cancer Maternal Aunt     Breast  . Cancer Paternal Aunt     Breast    Social History   Social History  . Marital status: Married    Spouse name: N/A  . Number of children: N/A  . Years of education: N/A   Occupational History  . Not on file.   Social History Main Topics  . Smoking status: Never Smoker  . Smokeless tobacco: Never Used  . Alcohol use No  . Drug use: No  . Sexual activity: Not on file   Other Topics Concern  . Not on file   Social History Narrative  . No narrative on file     Current Outpatient  Prescriptions:  .  amLODipine (NORVASC) 5 MG tablet, Take 1 tablet (5 mg total) by mouth daily., Disp: 90 tablet, Rfl: 0 .  benazepril (LOTENSIN) 40 MG tablet, Take 1 tablet (40 mg total) by mouth daily., Disp: 90 tablet, Rfl: 0 .  fluticasone (FLONASE) 50 MCG/ACT nasal spray, Place 2 sprays into both nostrils daily. (Patient taking differently: Place 2 sprays into both nostrils daily as needed for rhinitis. ), Disp: 16 g, Rfl: 6 .  famotidine (PEPCID) 20 MG tablet, Take 1 tablet (20 mg total) by mouth 2 (two) times daily. (Patient not taking: Reported on 03/29/2017), Disp: 30 tablet, Rfl: 0 .  loperamide (IMODIUM A-D) 2 MG tablet, Take 1 tablet (2 mg total) by mouth 4 (four) times daily as needed for diarrhea or loose stools. (Patient not taking: Reported on 03/29/2017), Disp: 20 tablet, Rfl: 0 .  meclizine (ANTIVERT) 25 MG tablet, Take 1 tablet (25 mg total) by mouth 3 (three) times daily as needed for dizziness or nausea. (Patient not taking: Reported on 03/29/2017), Disp: 30 tablet, Rfl: 1 .  metaxalone (SKELAXIN) 800 MG tablet, Take 1 tablet (800 mg total) by mouth 3 (three) times daily. (Patient not taking: Reported on 03/29/2017), Disp: 30 tablet, Rfl: 0 .  mometasone (ELOCON) 0.1 %  cream, Apply 1 application topically daily. (Patient not taking: Reported on 03/29/2017), Disp: 45 g, Rfl: 0 .  ondansetron (ZOFRAN ODT) 4 MG disintegrating tablet, Take 1 tablet (4 mg total) by mouth every 8 (eight) hours as needed for nausea or vomiting. (Patient not taking: Reported on 03/29/2017), Disp: 20 tablet, Rfl: 0 .  predniSONE (STERAPRED UNI-PAK 21 TAB) 10 MG (21) TBPK tablet, Take 1 tablet (10 mg total) by mouth daily. Take 6 tabs the first day, take 5 tabs the second day, take 4 tabs the third day, take 3 tabs the fourth day, take 2 tabs the fifth day, take 1 tab the sixth day. (Patient not taking: Reported on 03/29/2017), Disp: 21 tablet, Rfl: 0  No Known Allergies   Review of Systems  Eyes: Negative for  blurred vision.  Cardiovascular: Negative for palpitations and orthopnea.  Neurological: Negative for headaches.      Objective  Vitals:   03/29/17 1011  BP: 140/83  Pulse: 75  Resp: 16  Temp: 98.3 F (36.8 C)  TempSrc: Oral  SpO2: 98%  Weight: 176 lb 4.8 oz (80 kg)  Height: 5' (1.524 m)    Physical Exam  Constitutional: She is well-developed, well-nourished, and in no distress.  HENT:  Head: Normocephalic and atraumatic.  Nose: Right sinus exhibits maxillary sinus tenderness. Left sinus exhibits maxillary sinus tenderness.  Mouth/Throat: Posterior oropharyngeal erythema present. No posterior oropharyngeal edema.  Nasal turbinates hypertrophied, mucosal inflammation  Cardiovascular: Normal rate, regular rhythm, S1 normal and S2 normal.   Murmur heard.  Systolic murmur is present with a grade of 1/6  Pulmonary/Chest: Effort normal and breath sounds normal. No respiratory distress. She has no decreased breath sounds. She has no wheezes.  Abdominal: Soft. Bowel sounds are normal. There is no tenderness.  Psychiatric: Mood, memory, affect and judgment normal.  Nursing note and vitals reviewed.     Assessment & Plan  1. Essential hypertension, benign EP stable on present anti- hypertensive treatment - amLODipine (NORVASC) 5 MG tablet; Take 1 tablet (5 mg total) by mouth daily.  Dispense: 90 tablet; Refill: 0 - benazepril (LOTENSIN) 40 MG tablet; Take 1 tablet (40 mg total) by mouth daily.  Dispense: 90 tablet; Refill: 0  2. Pure hypercholesterolemia Obtained updated FLP, consider starting on statin - Lipid panel  3. Environmental and seasonal allergies Refill for Flonase to be used during the springtime allergy season - fluticasone (FLONASE) 50 MCG/ACT nasal spray; Place 2 sprays into both nostrils daily.  Dispense: 16 g; Refill: 3   Joyce Simpson Asad A. [facility] 03/29/2017 11:13 AM

## 2017-04-17 ENCOUNTER — Other Ambulatory Visit: Payer: Self-pay | Admitting: Family Medicine

## 2017-04-17 ENCOUNTER — Other Ambulatory Visit: Payer: Self-pay | Admitting: Internal Medicine

## 2017-04-17 DIAGNOSIS — Z1231 Encounter for screening mammogram for malignant neoplasm of breast: Secondary | ICD-10-CM

## 2017-05-01 ENCOUNTER — Encounter: Payer: Self-pay | Admitting: Family Medicine

## 2017-05-01 ENCOUNTER — Other Ambulatory Visit: Payer: Self-pay | Admitting: Family Medicine

## 2017-05-01 ENCOUNTER — Ambulatory Visit (INDEPENDENT_AMBULATORY_CARE_PROVIDER_SITE_OTHER): Payer: 59 | Admitting: Family Medicine

## 2017-05-01 VITALS — BP 135/80 | HR 74 | Temp 98.5°F | Resp 16 | Ht 60.0 in | Wt 176.8 lb

## 2017-05-01 DIAGNOSIS — Z780 Asymptomatic menopausal state: Secondary | ICD-10-CM

## 2017-05-01 DIAGNOSIS — Z124 Encounter for screening for malignant neoplasm of cervix: Secondary | ICD-10-CM

## 2017-05-01 DIAGNOSIS — Z1211 Encounter for screening for malignant neoplasm of colon: Secondary | ICD-10-CM

## 2017-05-01 DIAGNOSIS — Z01419 Encounter for gynecological examination (general) (routine) without abnormal findings: Secondary | ICD-10-CM | POA: Diagnosis not present

## 2017-05-01 LAB — COMPLETE METABOLIC PANEL WITH GFR
ALBUMIN: 4 g/dL (ref 3.6–5.1)
ALT: 16 U/L (ref 6–29)
AST: 15 U/L (ref 10–35)
Alkaline Phosphatase: 83 U/L (ref 33–130)
BUN: 9 mg/dL (ref 7–25)
CALCIUM: 9.2 mg/dL (ref 8.6–10.4)
CO2: 25 mmol/L (ref 20–31)
CREATININE: 0.73 mg/dL (ref 0.50–1.05)
Chloride: 105 mmol/L (ref 98–110)
GFR, Est African American: >89 mL/min (ref >=60)
GFR, Est Non African American: >89 mL/min (ref >=60)
Glucose, Bld: 89 mg/dL (ref 65–99)
Potassium: 4.3 mmol/L (ref 3.5–5.3)
Sodium: 139 mmol/L (ref 135–146)
TOTAL PROTEIN: 6.7 g/dL (ref 6.1–8.1)
Total Bilirubin: 0.4 mg/dL (ref 0.2–1.2)

## 2017-05-01 LAB — TSH: TSH: 1.14 m[IU]/L

## 2017-05-01 LAB — LIPID PANEL
Cholesterol: 192 mg/dL (ref <200)
HDL: 70 mg/dL (ref >50)
LDL CALC: 112 mg/dL — AB (ref <100)
TRIGLYCERIDES: 49 mg/dL (ref <150)
Total CHOL/HDL Ratio: 2.7 Ratio (ref <5.0)
VLDL: 10 mg/dL (ref <30)

## 2017-05-01 NOTE — Progress Notes (Signed)
Name: Joyce Simpson   MRN: [id number]    DOB: 1961/04/24   Date:05/01/2017       Progress Note  Subjective  Chief Complaint  Chief Complaint  Patient presents with  . Annual Exam    CPE    HPI  Pt. Presents for Complete Physical Exam.  She is due for Pap smear today. Colonoscopy was done ' a while back', records are not available. Never had a DEXA Scan. Mammogram is scheduled for next week.   Past Medical History:  Diagnosis Date  . Hypertension   . Migraines     Past Surgical History:  Procedure Laterality Date  . ABDOMINAL HYSTERECTOMY    . BACK SURGERY      Family History  Problem Relation Age of Onset  . Hypertension Mother   . AAA (abdominal aortic aneurysm) Mother   . Cancer Brother     Colon  . Hypertension Brother   . Diabetes Daughter   . AAA (abdominal aortic aneurysm) Brother   . Cancer Maternal Aunt     Breast  . Cancer Paternal Aunt     Breast    Social History   Social History  . Marital status: Married    Spouse name: N/A  . Number of children: N/A  . Years of education: N/A   Occupational History  . Not on file.   Social History Main Topics  . Smoking status: Never Smoker  . Smokeless tobacco: Never Used  . Alcohol use No  . Drug use: No  . Sexual activity: Not on file   Other Topics Concern  . Not on file   Social History Narrative  . No narrative on file     Current Outpatient Prescriptions:  .  amLODipine (NORVASC) 5 MG tablet, Take 1 tablet (5 mg total) by mouth daily., Disp: 90 tablet, Rfl: 0 .  benazepril (LOTENSIN) 40 MG tablet, Take 1 tablet (40 mg total) by mouth daily., Disp: 90 tablet, Rfl: 0 .  fluticasone (FLONASE) 50 MCG/ACT nasal spray, Place 2 sprays into both nostrils daily., Disp: 16 g, Rfl: 3  No Known Allergies   Review of Systems  Constitutional: Negative for chills, fever and malaise/fatigue.  HENT: Negative for congestion, ear pain and sore throat.   Eyes: Negative for blurred vision and double  vision.  Respiratory: Negative for cough and shortness of breath.   Cardiovascular: Negative for chest pain and leg swelling.  Gastrointestinal: Negative for blood in stool, nausea and vomiting.  Genitourinary: Negative for dysuria and hematuria.  Musculoskeletal: Negative for back pain and neck pain.  Skin: Negative for itching and rash.  Neurological: Negative for dizziness, tingling and headaches.  Psychiatric/Behavioral: Negative for depression. The patient is not nervous/anxious and does not have insomnia.       Objective  Vitals:   05/01/17 0824  BP: 135/80  Pulse: 74  Resp: 16  Temp: 98.5 F (36.9 C)  TempSrc: Oral  SpO2: 98%  Weight: 176 lb 12.8 oz (80.2 kg)  Height: 5' (1.524 m)    Physical Exam  Constitutional: She is oriented to person, place, and time and well-developed, well-nourished, and in no distress.  HENT:  Head: Normocephalic and atraumatic.  Cardiovascular: Normal rate, regular rhythm, S1 normal and S2 normal.   Murmur heard.  Systolic murmur is present with a grade of 2/6  Pulmonary/Chest: Effort normal and breath sounds normal. No respiratory distress. She has no wheezes. She has no rhonchi. Right breast exhibits no mass, no  nipple discharge and no skin change. Left breast exhibits skin change. Left breast exhibits no mass and no nipple discharge.  Abdominal: Soft. Bowel sounds are normal. There is no tenderness.  Genitourinary: Vagina normal, uterus normal, cervix normal, right adnexa normal and left adnexa normal. Cervix exhibits no lesion.  Neurological: She is alert and oriented to person, place, and time.  Skin: Skin is warm, dry and intact.  Psychiatric: Mood, memory, affect and judgment normal.  Nursing note and vitals reviewed.     Assessment & Plan  1. Well woman exam with routine gynecological exam  - COMPLETE METABOLIC PANEL WITH GFR - Lipid panel - TSH - VITAMIN D 25 Hydroxy (Vit-D Deficiency, Fractures)  2. Cervical cancer  screening  - Pap IG and HPV (high risk) DNA detection  3. Post-menopausal  - DG Bone Density; Future  4. Colon cancer screening  - Cologuard   Joyce Simpson Asad A[facility] 05/01/2017 8:33 AM

## 2017-05-02 LAB — VITAMIN D 25 HYDROXY (VIT D DEFICIENCY, FRACTURES): Vit D, 25-Hydroxy: 15 ng/mL — ABNORMAL LOW (ref 30–100)

## 2017-05-02 LAB — PAP IG AND HPV HIGH-RISK: HPV DNA High Risk: NOT DETECTED

## 2017-05-03 ENCOUNTER — Telehealth: Payer: Self-pay

## 2017-05-03 MED ORDER — VITAMIN D (ERGOCALCIFEROL) 1.25 MG (50000 UNIT) PO CAPS: 50000.0000 [IU] | ORAL_CAPSULE | ORAL | 0 refills | Status: DC

## 2017-05-03 NOTE — Telephone Encounter (Signed)
Patient has been notified of lab results and a prescription for vitamin D3 50,000 units take 1 capsule once a week x12 weeks has been sent to [place] per Dr. Manuella Ghazi, patient has been notified

## 2017-05-07 ENCOUNTER — Other Ambulatory Visit: Payer: Self-pay | Admitting: Family Medicine

## 2017-05-07 ENCOUNTER — Ambulatory Visit
Admission: RE | Admit: 2017-05-07 | Discharge: 2017-05-07 | Disposition: A | Discharge status: Home / Self Care | Payer: 59 | Source: Ambulatory Visit | Attending: Family Medicine | Admitting: Family Medicine

## 2017-05-07 DIAGNOSIS — N6452 Nipple discharge: Secondary | ICD-10-CM

## 2017-05-07 DIAGNOSIS — Z1231 Encounter for screening mammogram for malignant neoplasm of breast: Secondary | ICD-10-CM

## 2017-05-16 ENCOUNTER — Other Ambulatory Visit: Payer: Self-pay

## 2017-05-16 DIAGNOSIS — Z780 Asymptomatic menopausal state: Secondary | ICD-10-CM

## 2017-05-16 DIAGNOSIS — N6452 Nipple discharge: Secondary | ICD-10-CM

## 2017-05-31 ENCOUNTER — Ambulatory Visit
Admission: RE | Admit: 2017-05-31 | Discharge: 2017-05-31 | Disposition: A | Discharge status: Home / Self Care | Payer: 59 | Source: Ambulatory Visit | Attending: Family Medicine | Admitting: Family Medicine

## 2017-05-31 ENCOUNTER — Ambulatory Visit: Admission: RE | Admit: 2017-05-31 | Payer: 59 | Source: Ambulatory Visit

## 2017-05-31 DIAGNOSIS — N6452 Nipple discharge: Secondary | ICD-10-CM | POA: Diagnosis not present

## 2017-05-31 DIAGNOSIS — R928 Other abnormal and inconclusive findings on diagnostic imaging of breast: Secondary | ICD-10-CM | POA: Diagnosis not present

## 2017-05-31 DIAGNOSIS — N6489 Other specified disorders of breast: Secondary | ICD-10-CM | POA: Diagnosis not present

## 2017-05-31 HISTORY — DX: Nipple discharge: N64.52

## 2017-06-17 ENCOUNTER — Ambulatory Visit
Admission: RE | Admit: 2017-06-17 | Discharge: 2017-06-17 | Disposition: A | Discharge status: Home / Self Care | Payer: 59 | Source: Ambulatory Visit | Attending: Family Medicine | Admitting: Family Medicine

## 2017-06-17 DIAGNOSIS — M810 Age-related osteoporosis without current pathological fracture: Secondary | ICD-10-CM | POA: Insufficient documentation

## 2017-06-17 DIAGNOSIS — Z780 Asymptomatic menopausal state: Secondary | ICD-10-CM | POA: Diagnosis not present

## 2017-07-01 ENCOUNTER — Ambulatory Visit (INDEPENDENT_AMBULATORY_CARE_PROVIDER_SITE_OTHER): Payer: 59 | Admitting: Family Medicine

## 2017-07-01 ENCOUNTER — Encounter: Payer: Self-pay | Admitting: Family Medicine

## 2017-07-01 ENCOUNTER — Telehealth: Payer: Self-pay | Admitting: Family Medicine

## 2017-07-01 VITALS — BP 116/74 | HR 71 | Temp 97.7°F | Resp 14 | Ht 60.0 in | Wt 173.3 lb

## 2017-07-01 DIAGNOSIS — M545 Low back pain, unspecified: Secondary | ICD-10-CM

## 2017-07-01 DIAGNOSIS — I10 Essential (primary) hypertension: Secondary | ICD-10-CM | POA: Diagnosis not present

## 2017-07-01 DIAGNOSIS — M810 Age-related osteoporosis without current pathological fracture: Secondary | ICD-10-CM

## 2017-07-01 DIAGNOSIS — M818 Other osteoporosis without current pathological fracture: Secondary | ICD-10-CM

## 2017-07-01 DIAGNOSIS — L859 Epidermal thickening, unspecified: Secondary | ICD-10-CM | POA: Diagnosis not present

## 2017-07-01 DIAGNOSIS — L821 Other seborrheic keratosis: Secondary | ICD-10-CM | POA: Diagnosis not present

## 2017-07-01 MED ORDER — TIZANIDINE HCL 2 MG PO TABS: 2.0000 mg | ORAL_TABLET | Freq: Two times a day (BID) | ORAL | 0 refills | Status: DC | PRN

## 2017-07-01 MED ORDER — ALENDRONATE SODIUM 70 MG PO TABS: 70.0000 mg | ORAL_TABLET | ORAL | 3 refills | Status: DC

## 2017-07-01 MED ORDER — BENAZEPRIL HCL 40 MG PO TABS: 40.0000 mg | ORAL_TABLET | Freq: Every day | ORAL | 0 refills | Status: DC

## 2017-07-01 MED ORDER — AMLODIPINE BESYLATE 5 MG PO TABS: 5.0000 mg | ORAL_TABLET | Freq: Every day | ORAL | 0 refills | Status: DC

## 2017-07-01 NOTE — Telephone Encounter (Signed)
PT SAID THAT THE DR WAS TO GIVE HER A NEW RX FOR HER VITAMINS. SAID THAT HER BONES WAS MESSED UP IT WAS FOR HER BONES. SHE CAN NOT REMEMBER THE NAME OF THE MEDICATION BUT IT WAS NOT CALLED IN. PLEASE DO SO. PHARM IS [facility]

## 2017-07-01 NOTE — Progress Notes (Signed)
Name: Joyce Simpson   MRN: [id number]    DOB: June 05, 1961   Date:07/01/2017       Progress Note  Subjective  Chief Complaint  Chief Complaint  Patient presents with  . Follow-up    2 month  . Medication Refill  . Results    abnormal labs  . Back Pain    low back flares up a couple times a month    Back Pain  This is a new problem. The current episode started 1 to 4 weeks ago (2 weeks ago). The problem occurs daily. The pain is present in the lumbar spine. The quality of the pain is described as shooting. The pain does not radiate. The pain is at a severity of 9/10. The pain is moderate. The pain is the same all the time. The symptoms are aggravated by bending and position (especially sitting down makes it worse). Stiffness is present all day. Pertinent negatives include no bladder incontinence, bowel incontinence, leg pain or numbness.      Past Medical History:  Diagnosis Date  . Breast discharge    LEFT BREAST -- Milky and Clear  . Hypertension   . Migraines     Past Surgical History:  Procedure Laterality Date  . ABDOMINAL HYSTERECTOMY    . BACK SURGERY      Family History  Problem Relation Age of Onset  . Hypertension Mother   . AAA (abdominal aortic aneurysm) Mother   . Cancer Brother        Colon  . Hypertension Brother   . Diabetes Daughter   . AAA (abdominal aortic aneurysm) Brother   . Cancer Maternal Aunt        Breast  . Breast cancer Maternal Aunt   . Cancer Paternal Aunt        Breast  . Breast cancer Paternal Aunt     Social History   Social History  . Marital status: Married    Spouse name: N/A  . Number of children: N/A  . Years of education: N/A   Occupational History  . Not on file.   Social History Main Topics  . Smoking status: Never Smoker  . Smokeless tobacco: Never Used  . Alcohol use No  . Drug use: No  . Sexual activity: Not on file   Other Topics Concern  . Not on file   Social History Narrative  . No narrative on  file     Current Outpatient Prescriptions:  .  amLODipine (NORVASC) 5 MG tablet, Take 1 tablet (5 mg total) by mouth daily., Disp: 90 tablet, Rfl: 0 .  benazepril (LOTENSIN) 40 MG tablet, Take 1 tablet (40 mg total) by mouth daily., Disp: 90 tablet, Rfl: 0 .  fluticasone (FLONASE) 50 MCG/ACT nasal spray, Place 2 sprays into both nostrils daily. (Patient taking differently: Place 2 sprays into both nostrils as needed. ), Disp: 16 g, Rfl: 3 .  Vitamin D, Ergocalciferol, (DRISDOL) 50000 units CAPS capsule, Take 1 capsule (50,000 Units total) by mouth once a week. For 12 weeks, Disp: 12 capsule, Rfl: 0  No Known Allergies   Review of Systems  Gastrointestinal: Negative for bowel incontinence.  Genitourinary: Negative for bladder incontinence.  Musculoskeletal: Positive for back pain.  Neurological: Negative for numbness.      Objective  Vitals:   07/01/17 1121  BP: 116/74  Pulse: 71  Resp: 14  Temp: 97.7 F (36.5 C)  TempSrc: Oral  SpO2: 99%  Weight: 173 lb 4.8 oz (  78.6 kg)  Height: 5' (1.524 m)    Physical Exam  Constitutional: She is oriented to person, place, and time and well-developed, well-nourished, and in no distress.  HENT:  Head: Normocephalic and atraumatic.  Cardiovascular: Normal rate, regular rhythm and normal heart sounds.   No murmur heard. Pulmonary/Chest: Effort normal and breath sounds normal. She has no wheezes.  Musculoskeletal: She exhibits no edema.       Back:  Neurological: She is alert and oriented to person, place, and time.  Psychiatric: Mood, memory, affect and judgment normal.  Nursing note and vitals reviewed.      Assessment & Plan  1. Osteoporosis of lumbar spine DEXA scan shows T score of -2.9, she will be started on Fosamax, recheck in 1 year - alendronate (FOSAMAX) 70 MG tablet; Take 1 tablet (70 mg total) by mouth once a week. Take with a full glass of water on an empty stomach.  Dispense: 12 tablet; Refill: 3  2. Acute  bilateral low back pain without sciatica Likely due to muscle strain, start on muscle relaxant to be taken at night - tiZANidine (ZANAFLEX) 2 MG tablet; Take 1 tablet (2 mg total) by mouth every 12 (twelve) hours as needed for muscle spasms.  Dispense: 30 tablet; Refill: 0  3. Essential hypertension, benign  - benazepril (LOTENSIN) 40 MG tablet; Take 1 tablet (40 mg total) by mouth daily.  Dispense: 90 tablet; Refill: 0 - amLODipine (NORVASC) 5 MG tablet; Take 1 tablet (5 mg total) by mouth daily.  Dispense: 90 tablet; Refill: 0   Daymion Nazaire Asad A[facility] 07/01/2017 11:31 AM

## 2017-07-02 NOTE — Telephone Encounter (Signed)
Patient notified it was sent in, it may need a prior auth, she will contact pharmacy

## 2017-07-29 ENCOUNTER — Ambulatory Visit: Payer: 59 | Admitting: Family Medicine

## 2017-09-30 ENCOUNTER — Ambulatory Visit: Payer: 59 | Admitting: Family Medicine

## 2017-11-05 ENCOUNTER — Ambulatory Visit (INDEPENDENT_AMBULATORY_CARE_PROVIDER_SITE_OTHER): Payer: 59 | Admitting: Family Medicine

## 2017-11-05 ENCOUNTER — Encounter: Payer: Self-pay | Admitting: Family Medicine

## 2017-11-05 VITALS — BP 138/74 | HR 66 | Temp 97.8°F | Resp 14 | Ht 60.0 in | Wt 178.2 lb

## 2017-11-05 DIAGNOSIS — I10 Essential (primary) hypertension: Secondary | ICD-10-CM | POA: Diagnosis not present

## 2017-11-05 DIAGNOSIS — E7800 Pure hypercholesterolemia, unspecified: Secondary | ICD-10-CM

## 2017-11-05 DIAGNOSIS — E559 Vitamin D deficiency, unspecified: Secondary | ICD-10-CM | POA: Insufficient documentation

## 2017-11-05 MED ORDER — BENAZEPRIL HCL 40 MG PO TABS: 40.0000 mg | ORAL_TABLET | Freq: Every day | ORAL | 0 refills | Status: DC

## 2017-11-05 MED ORDER — AMLODIPINE BESYLATE 5 MG PO TABS: 5.0000 mg | ORAL_TABLET | Freq: Every day | ORAL | 0 refills | Status: DC

## 2017-11-05 NOTE — Progress Notes (Signed)
Name: Joyce Simpson   MRN: [id number]    DOB: 06-22-1961   Date:11/05/2017       Progress Note  Subjective  Chief Complaint  Chief Complaint  Patient presents with  . Medication Refill    BP meds    Hypertension  This is a chronic problem. The problem is unchanged. The problem is controlled. Pertinent negatives include no blurred vision, chest pain, headaches, orthopnea or palpitations. Risk factors for coronary artery disease include dyslipidemia. Past treatments include calcium channel blockers and ACE inhibitors. There is no history of kidney disease, CAD/MI or CVA.  Hyperlipidemia  This is a chronic problem. The problem is uncontrolled. Recent lipid tests were reviewed and are high (elevated LDL). Pertinent negatives include no chest pain. She is currently on no antihyperlipidemic treatment.   Vitamin D  Deficiency: Shunt was diagnosed with vitamin D deficiency, last level obtained 3 months ago was 15 ng per mL, following that she has finished the 12 week course of vitamin D 50,000 units every week. She is due for recheck of vitamin D levels  Past Medical History:  Diagnosis Date  . Breast discharge    LEFT BREAST -- Milky and Clear  . Hypertension   . Migraines     Past Surgical History:  Procedure Laterality Date  . ABDOMINAL HYSTERECTOMY    . BACK SURGERY      Family History  Problem Relation Age of Onset  . Hypertension Mother   . AAA (abdominal aortic aneurysm) Mother   . Cancer Brother        Colon  . Hypertension Brother   . Diabetes Daughter   . AAA (abdominal aortic aneurysm) Brother   . Cancer Maternal Aunt        Breast  . Breast cancer Maternal Aunt   . Cancer Paternal Aunt        Breast  . Breast cancer Paternal Aunt     Social History   Socioeconomic History  . Marital status: Married    Spouse name: Not on file  . Number of children: Not on file  . Years of education: Not on file  . Highest education level: Not on file  Social Needs  .  Financial resource strain: Not on file  . Food insecurity - worry: Not on file  . Food insecurity - inability: Not on file  . Transportation needs - medical: Not on file  . Transportation needs - non-medical: Not on file  Occupational History  . Not on file  Tobacco Use  . Smoking status: Never Smoker  . Smokeless tobacco: Never Used  Substance and Sexual Activity  . Alcohol use: No  . Drug use: No  . Sexual activity: Not on file  Other Topics Concern  . Not on file  Social History Narrative  . Not on file     Current Outpatient Medications:  .  amLODipine (NORVASC) 5 MG tablet, Take 1 tablet (5 mg total) by mouth daily., Disp: 90 tablet, Rfl: 0 .  benazepril (LOTENSIN) 40 MG tablet, Take 1 tablet (40 mg total) by mouth daily., Disp: 90 tablet, Rfl: 0 .  alendronate (FOSAMAX) 70 MG tablet, Take 1 tablet (70 mg total) by mouth once a week. Take with a full glass of water on an empty stomach. (Patient not taking: Reported on 11/05/2017), Disp: 12 tablet, Rfl: 3 .  fluticasone (FLONASE) 50 MCG/ACT nasal spray, Place 2 sprays into both nostrils daily. (Patient not taking: Reported on 11/05/2017), Disp: 16  g, Rfl: 3 .  tiZANidine (ZANAFLEX) 2 MG tablet, Take 1 tablet (2 mg total) by mouth every 12 (twelve) hours as needed for muscle spasms. (Patient not taking: Reported on 11/05/2017), Disp: 30 tablet, Rfl: 0 .  Vitamin D, Ergocalciferol, (DRISDOL) 50000 units CAPS capsule, Take 1 capsule (50,000 Units total) by mouth once a week. For 12 weeks (Patient not taking: Reported on 11/05/2017), Disp: 12 capsule, Rfl: 0  No Known Allergies   Review of Systems  Eyes: Negative for blurred vision.  Cardiovascular: Negative for chest pain, palpitations and orthopnea.  Neurological: Negative for headaches.     Objective  Vitals:   11/05/17 1609 11/05/17 1612  BP: (!) 142/76 138/74  Pulse: 66   Resp: 14   Temp: 97.8 F (36.6 C)   TempSrc: Oral   SpO2: 98%   Weight: 178 lb 3.2 oz (80.8 kg)    Height: 5' (1.524 m)     Physical Exam  Constitutional: She is oriented to person, place, and time and well-developed, well-nourished, and in no distress.  HENT:  Head: Normocephalic and atraumatic.  Cardiovascular: Normal rate, regular rhythm, S1 normal and S2 normal.  Murmur heard.  Systolic murmur is present with a grade of 2/6. Pulmonary/Chest: Effort normal and breath sounds normal. She has no wheezes.  Musculoskeletal: She exhibits no edema.  Neurological: She is alert and oriented to person, place, and time.  Psychiatric: Mood, memory, affect and judgment normal.  Nursing note and vitals reviewed.     Assessment & Plan  1. Essential hypertension, benign Improved on manual recheck, continue on present pharmacotherapy - amLODipine (NORVASC) 5 MG tablet; Take 1 tablet (5 mg total) daily by mouth.  Dispense: 90 tablet; Refill: 0 - benazepril (LOTENSIN) 40 MG tablet; Take 1 tablet (40 mg total) daily by mouth.  Dispense: 90 tablet; Refill: 0  2. Pure hypercholesterolemia 10 FLP, consider starting on statin - Lipid panel  3. Vitamin D deficiency  - VITAMIN D 25 Hydroxy (Vit-D Deficiency, Fractures)   Zailah Zagami [facility] 11/05/2017 4:19 PM

## 2018-01-22 DIAGNOSIS — J019 Acute sinusitis, unspecified: Secondary | ICD-10-CM | POA: Diagnosis not present

## 2018-02-05 ENCOUNTER — Ambulatory Visit: Payer: 59 | Admitting: Family Medicine

## 2018-02-28 ENCOUNTER — Encounter: Payer: Self-pay | Admitting: Internal Medicine

## 2018-02-28 ENCOUNTER — Ambulatory Visit (INDEPENDENT_AMBULATORY_CARE_PROVIDER_SITE_OTHER): Payer: 59 | Admitting: Internal Medicine

## 2018-02-28 VITALS — BP 144/68 | HR 78 | Temp 98.0°F | Ht 60.0 in | Wt 176.6 lb

## 2018-02-28 DIAGNOSIS — Z113 Encounter for screening for infections with a predominantly sexual mode of transmission: Secondary | ICD-10-CM

## 2018-02-28 DIAGNOSIS — Z1159 Encounter for screening for other viral diseases: Secondary | ICD-10-CM

## 2018-02-28 DIAGNOSIS — M818 Other osteoporosis without current pathological fracture: Secondary | ICD-10-CM

## 2018-02-28 DIAGNOSIS — I10 Essential (primary) hypertension: Secondary | ICD-10-CM

## 2018-02-28 DIAGNOSIS — M810 Age-related osteoporosis without current pathological fracture: Secondary | ICD-10-CM | POA: Insufficient documentation

## 2018-02-28 DIAGNOSIS — R739 Hyperglycemia, unspecified: Secondary | ICD-10-CM

## 2018-02-28 DIAGNOSIS — R011 Cardiac murmur, unspecified: Secondary | ICD-10-CM | POA: Diagnosis not present

## 2018-02-28 DIAGNOSIS — E559 Vitamin D deficiency, unspecified: Secondary | ICD-10-CM

## 2018-02-28 DIAGNOSIS — Z13818 Encounter for screening for other digestive system disorders: Secondary | ICD-10-CM

## 2018-02-28 DIAGNOSIS — Z1211 Encounter for screening for malignant neoplasm of colon: Secondary | ICD-10-CM

## 2018-02-28 DIAGNOSIS — E538 Deficiency of other specified B group vitamins: Secondary | ICD-10-CM

## 2018-02-28 DIAGNOSIS — E785 Hyperlipidemia, unspecified: Secondary | ICD-10-CM | POA: Diagnosis not present

## 2018-02-28 MED ORDER — HYDROCHLOROTHIAZIDE 12.5 MG PO TABS: 12.5000 mg | ORAL_TABLET | Freq: Every day | ORAL | 1 refills | Status: DC

## 2018-02-28 MED ORDER — ALENDRONATE SODIUM 70 MG PO TABS: 70.0000 mg | ORAL_TABLET | ORAL | 3 refills | Status: DC

## 2018-02-28 NOTE — Patient Instructions (Addendum)
Please f/u in 3 weeks  We referred you the the GI doctor for colonoscopy  Please take hydrochlorothiazide 12.5 mg in the am  Please sch labs next week fasting no food only pills x 12 hours  Take care   DASH Eating Plan DASH stands for "Dietary Approaches to Stop Hypertension." The DASH eating plan is a healthy eating plan that has been shown to reduce high blood pressure (hypertension). It may also reduce your risk for type 2 diabetes, heart disease, and stroke. The DASH eating plan may also help with weight loss. What are tips for following this plan? General guidelines  Avoid eating more than 2,300 mg (milligrams) of salt (sodium) a day. If you have hypertension, you may need to reduce your sodium intake to 1,500 mg a day.  Limit alcohol intake to no more than 1 drink a day for nonpregnant women and 2 drinks a day for men. One drink equals 12 oz of beer, 5 oz of wine, or 1 oz of hard liquor.  Work with your health care provider to maintain a healthy body weight or to lose weight. Ask what an ideal weight is for you.  Get at least 30 minutes of exercise that causes your heart to beat faster (aerobic exercise) most days of the week. Activities may include walking, swimming, or biking.  Work with your health care provider or diet and nutrition specialist (dietitian) to adjust your eating plan to your individual calorie needs. Reading food labels  Check food labels for the amount of sodium per serving. Choose foods with less than 5 percent of the Daily Value of sodium. Generally, foods with less than 300 mg of sodium per serving fit into this eating plan.  To find whole grains, look for the word "whole" as the first word in the ingredient list. Shopping  Buy products labeled as "low-sodium" or "no salt added."  Buy fresh foods. Avoid canned foods and premade or frozen meals. Cooking  Avoid adding salt when cooking. Use salt-free seasonings or herbs instead of table salt or sea salt.  Check with your health care provider or pharmacist before using salt substitutes.  Do not fry foods. Cook foods using healthy methods such as baking, boiling, grilling, and broiling instead.  Cook with heart-healthy oils, such as olive, canola, soybean, or sunflower oil. Meal planning   Eat a balanced diet that includes: ? 5 or more servings of fruits and vegetables each day. At each meal, try to fill half of your plate with fruits and vegetables. ? Up to 6-8 servings of whole grains each day. ? Less than 6 oz of lean meat, poultry, or fish each day. A 3-oz serving of meat is about the same size as a deck of cards. One egg equals 1 oz. ? 2 servings of low-fat dairy each day. ? A serving of nuts, seeds, or beans 5 times each week. ? Heart-healthy fats. Healthy fats called Omega-3 fatty acids are found in foods such as flaxseeds and coldwater fish, like sardines, salmon, and mackerel.  Limit how much you eat of the following: ? Canned or prepackaged foods. ? Food that is high in trans fat, such as fried foods. ? Food that is high in saturated fat, such as fatty meat. ? Sweets, desserts, sugary drinks, and other foods with added sugar. ? Full-fat dairy products.  Do not salt foods before eating.  Try to eat at least 2 vegetarian meals each week.  Eat more home-cooked food and less restaurant,  buffet, and fast food.  When eating at a restaurant, ask that your food be prepared with less salt or no salt, if possible. What foods are recommended? The items listed may not be a complete list. Talk with your dietitian about what dietary choices are best for you. Grains Whole-grain or whole-wheat bread. Whole-grain or whole-wheat pasta. Brown rice. [place]. Bulgur. Whole-grain and low-sodium cereals. Pita bread. Low-fat, low-sodium crackers. Whole-wheat flour tortillas. Vegetables Fresh or frozen vegetables (raw, steamed, roasted, or grilled). Low-sodium or reduced-sodium tomato and  vegetable juice. Low-sodium or reduced-sodium tomato sauce and tomato paste. Low-sodium or reduced-sodium canned vegetables. Fruits All fresh, dried, or frozen fruit. Canned fruit in natural juice (without added sugar). Meat and other protein foods Skinless chicken or [place]. Ground chicken or [place]. Pork with fat trimmed off. Fish and seafood. Egg whites. Dried beans, peas, or lentils. Unsalted nuts, nut butters, and seeds. Unsalted canned beans. Lean cuts of beef with fat trimmed off. Low-sodium, lean deli meat. Dairy Low-fat (1%) or fat-free (skim) milk. Fat-free, low-fat, or reduced-fat cheeses. Nonfat, low-sodium ricotta or cottage cheese. Low-fat or nonfat yogurt. Low-fat, low-sodium cheese. Fats and oils Soft margarine without trans fats. Vegetable oil. Low-fat, reduced-fat, or light mayonnaise and salad dressings (reduced-sodium). Canola, safflower, olive, soybean, and sunflower oils. Avocado. Seasoning and other foods Herbs. Spices. Seasoning mixes without salt. Unsalted popcorn and pretzels. Fat-free sweets. What foods are not recommended? The items listed may not be a complete list. Talk with your dietitian about what dietary choices are best for you. Grains Baked goods made with fat, such as croissants, muffins, or some breads. Dry pasta or rice meal packs. Vegetables Creamed or fried vegetables. Vegetables in a cheese sauce. Regular canned vegetables (not low-sodium or reduced-sodium). Regular canned tomato sauce and paste (not low-sodium or reduced-sodium). Regular tomato and vegetable juice (not low-sodium or reduced-sodium). Angie Fava. Olives. Fruits Canned fruit in a light or heavy syrup. Fried fruit. Fruit in cream or butter sauce. Meat and other protein foods Fatty cuts of meat. Ribs. Fried meat. Berniece Salines. Sausage. Bologna and other processed lunch meats. Salami. Fatback. Hotdogs. Bratwurst. Salted nuts and seeds. Canned beans with added salt. Canned or smoked fish. Whole eggs or  egg yolks. Chicken or [place] with skin. Dairy Whole or 2% milk, cream, and half-and-half. Whole or full-fat cream cheese. Whole-fat or sweetened yogurt. Full-fat cheese. Nondairy creamers. Whipped toppings. Processed cheese and cheese spreads. Fats and oils Butter. Stick margarine. Lard. Shortening. Ghee. Bacon fat. Tropical oils, such as coconut, palm kernel, or palm oil. Seasoning and other foods Salted popcorn and pretzels. Onion salt, garlic salt, seasoned salt, table salt, and sea salt. Worcestershire sauce. Tartar sauce. Barbecue sauce. Teriyaki sauce. Soy sauce, including reduced-sodium. Steak sauce. Canned and packaged gravies. Fish sauce. Oyster sauce. Cocktail sauce. Horseradish that you find on the shelf. Ketchup. Mustard. Meat flavorings and tenderizers. Bouillon cubes. Hot sauce and Tabasco sauce. Premade or packaged marinades. Premade or packaged taco seasonings. Relishes. Regular salad dressings. Where to find more information:  National Heart, Lung, and [place]: https://wilson-eaton.com/  American Heart Association: www.heart.org Summary  The DASH eating plan is a healthy eating plan that has been shown to reduce high blood pressure (hypertension). It may also reduce your risk for type 2 diabetes, heart disease, and stroke.  With the DASH eating plan, you should limit salt (sodium) intake to 2,300 mg a day. If you have hypertension, you may need to reduce your sodium intake to 1,500 mg a day.  When  on the DASH eating plan, aim to eat more fresh fruits and vegetables, whole grains, lean proteins, low-fat dairy, and heart-healthy fats.  Work with your health care provider or diet and nutrition specialist (dietitian) to adjust your eating plan to your individual calorie needs. This information is not intended to replace advice given to you by your health care provider. Make sure you discuss any questions you have with your health care provider. Document Released: 12/06/2011  Document Revised: 12/10/2016 Document Reviewed: 12/10/2016 Elsevier Interactive Patient Education  2018 [employer].  Hypertension Hypertension, commonly called high blood pressure, is when the force of blood pumping through the arteries is too strong. The arteries are the blood vessels that carry blood from the heart throughout the body. Hypertension forces the heart to work harder to pump blood and may cause arteries to become narrow or stiff. Having untreated or uncontrolled hypertension can cause heart attacks, strokes, kidney disease, and other problems. A blood pressure reading consists of a higher number over a lower number. Ideally, your blood pressure should be below 120/80. The first ("top") number is called the systolic pressure. It is a measure of the pressure in your arteries as your heart beats. The second ("bottom") number is called the diastolic pressure. It is a measure of the pressure in your arteries as the heart relaxes. What are the causes? The cause of this condition is not known. What increases the risk? Some risk factors for high blood pressure are under your control. Others are not. Factors you can change  Smoking.  Having type 2 diabetes mellitus, high cholesterol, or both.  Not getting enough exercise or physical activity.  Being overweight.  Having too much fat, sugar, calories, or salt (sodium) in your diet.  Drinking too much alcohol. Factors that are difficult or impossible to change  Having chronic kidney disease.  Having a family history of high blood pressure.  Age. Risk increases with age.  Race. You may be at higher risk if you are African-American.  Gender. Men are at higher risk than women before age 43. After age 88, women are at higher risk than men.  Having obstructive sleep apnea.  Stress. What are the signs or symptoms? Extremely high blood pressure (hypertensive crisis) may cause:  Headache.  Anxiety.  Shortness of  breath.  Nosebleed.  Nausea and vomiting.  Severe chest pain.  Jerky movements you cannot control (seizures).  How is this diagnosed? This condition is diagnosed by measuring your blood pressure while you are seated, with your arm resting on a surface. The cuff of the blood pressure monitor will be placed directly against the skin of your upper arm at the level of your heart. It should be measured at least twice using the same arm. Certain conditions can cause a difference in blood pressure between your right and left arms. Certain factors can cause blood pressure readings to be lower or higher than normal (elevated) for a short period of time:  When your blood pressure is higher when you are in a health care provider's office than when you are at home, this is called white coat hypertension. Most people with this condition do not need medicines.  When your blood pressure is higher at home than when you are in a health care provider's office, this is called masked hypertension. Most people with this condition may need medicines to control blood pressure.  If you have a high blood pressure reading during one visit or you have normal blood pressure  with other risk factors:  You may be asked to return on a different day to have your blood pressure checked again.  You may be asked to monitor your blood pressure at home for 1 week or longer.  If you are diagnosed with hypertension, you may have other blood or imaging tests to help your health care provider understand your overall risk for other conditions. How is this treated? This condition is treated by making healthy lifestyle changes, such as eating healthy foods, exercising more, and reducing your alcohol intake. Your health care provider may prescribe medicine if lifestyle changes are not enough to get your blood pressure under control, and if:  Your systolic blood pressure is above 130.  Your diastolic blood pressure is above  80.  Your personal target blood pressure may vary depending on your medical conditions, your age, and other factors. Follow these instructions at home: Eating and drinking  Eat a diet that is high in fiber and potassium, and low in sodium, added sugar, and fat. An example eating plan is called the DASH (Dietary Approaches to Stop Hypertension) diet. To eat this way: ? Eat plenty of fresh fruits and vegetables. Try to fill half of your plate at each meal with fruits and vegetables. ? Eat whole grains, such as whole wheat pasta, brown rice, or whole grain bread. Fill about one quarter of your plate with whole grains. ? Eat or drink low-fat dairy products, such as skim milk or low-fat yogurt. ? Avoid fatty cuts of meat, processed or cured meats, and poultry with skin. Fill about one quarter of your plate with lean proteins, such as fish, chicken without skin, beans, eggs, and tofu. ? Avoid premade and processed foods. These tend to be higher in sodium, added sugar, and fat.  Reduce your daily sodium intake. Most people with hypertension should eat less than 1,500 mg of sodium a day.  Limit alcohol intake to no more than 1 drink a day for nonpregnant women and 2 drinks a day for men. One drink equals 12 oz of beer, 5 oz of wine, or 1 oz of hard liquor. Lifestyle  Work with your health care provider to maintain a healthy body weight or to lose weight. Ask what an ideal weight is for you.  Get at least 30 minutes of exercise that causes your heart to beat faster (aerobic exercise) most days of the week. Activities may include walking, swimming, or biking.  Include exercise to strengthen your muscles (resistance exercise), such as pilates or lifting weights, as part of your weekly exercise routine. Try to do these types of exercises for 30 minutes at least 3 days a week.  Do not use any products that contain nicotine or tobacco, such as cigarettes and e-cigarettes. If you need help quitting, ask  your health care provider.  Monitor your blood pressure at home as told by your health care provider.  Keep all follow-up visits as told by your health care provider. This is important. Medicines  Take over-the-counter and prescription medicines only as told by your health care provider. Follow directions carefully. Blood pressure medicines must be taken as prescribed.  Do not skip doses of blood pressure medicine. Doing this puts you at risk for problems and can make the medicine less effective.  Ask your health care provider about side effects or reactions to medicines that you should watch for. Contact a health care provider if:  You think you are having a reaction to a medicine you  are taking.  You have headaches that keep coming back (recurring).  You feel dizzy.  You have swelling in your ankles.  You have trouble with your vision. Get help right away if:  You develop a severe headache or confusion.  You have unusual weakness or numbness.  You feel faint.  You have severe pain in your chest or abdomen.  You vomit repeatedly.  You have trouble breathing. Summary  Hypertension is when the force of blood pumping through your arteries is too strong. If this condition is not controlled, it may put you at risk for serious complications.  Your personal target blood pressure may vary depending on your medical conditions, your age, and other factors. For most people, a normal blood pressure is less than 120/80.  Hypertension is treated with lifestyle changes, medicines, or a combination of both. Lifestyle changes include weight loss, eating a healthy, low-sodium diet, exercising more, and limiting alcohol. This information is not intended to replace advice given to you by your health care provider. Make sure you discuss any questions you have with your health care provider. Document Released: 12/17/2005 Document Revised: 11/14/2016 Document Reviewed: 11/14/2016 Elsevier  Interactive Patient Education  [employer].

## 2018-02-28 NOTE — Progress Notes (Signed)
Pre visit review using our clinic review tool, if applicable. No additional management support is needed unless otherwise documented below in the visit note. 

## 2018-03-03 ENCOUNTER — Telehealth: Payer: Self-pay

## 2018-03-03 ENCOUNTER — Other Ambulatory Visit: Payer: Self-pay

## 2018-03-03 ENCOUNTER — Encounter: Payer: Self-pay | Admitting: Internal Medicine

## 2018-03-03 DIAGNOSIS — Z1211 Encounter for screening for malignant neoplasm of colon: Secondary | ICD-10-CM

## 2018-03-03 NOTE — Telephone Encounter (Signed)
Gastroenterology Pre-Procedure Review  Request Date: 04/11/18 Requesting Physician: Dr. Vicente Males  PATIENT REVIEW QUESTIONS: The patient responded to the following health history questions as indicated:    1. Are you having any GI issues? no 2. Do you have a personal history of Polyps? no 3. Do you have a family history of Colon Cancer or Polyps? yes (Brother (3) Colon cancer) 4. Diabetes Mellitus? no 5. Joint replacements in the past 12 months?no 6. Major health problems in the past 3 months?no 7. Any artificial heart valves, MVP, or defibrillator?no    MEDICATIONS & ALLERGIES:    Patient reports the following regarding taking any anticoagulation/antiplatelet therapy:   Plavix, Coumadin, Eliquis, Xarelto, Lovenox, Pradaxa, Brilinta, or Effient? no Aspirin? no  Patient confirms/reports the following medications:  Current Outpatient Medications  Medication Sig Dispense Refill  . alendronate (FOSAMAX) 70 MG tablet Take 1 tablet (70 mg total) by mouth once a week. Take with a full glass of water on an empty stomach. 12 tablet 3  . amLODipine (NORVASC) 5 MG tablet Take 1 tablet (5 mg total) daily by mouth. 90 tablet 0  . benazepril (LOTENSIN) 40 MG tablet Take 1 tablet (40 mg total) daily by mouth. 90 tablet 0  . fluticasone (FLONASE) 50 MCG/ACT nasal spray Place 2 sprays into both nostrils daily. 16 g 3  . hydrochlorothiazide (HYDRODIURIL) 12.5 MG tablet Take 1 tablet (12.5 mg total) by mouth daily. 90 tablet 1   No current facility-administered medications for this visit.     Patient confirms/reports the following allergies:  No Known Allergies  No orders of the defined types were placed in this encounter.   AUTHORIZATION INFORMATION Primary Insurance: 1D#: Group #:  Secondary Insurance: 1D#: Group #:  SCHEDULE INFORMATION: Date: 04/11/18 Time: Location:[facility]

## 2018-03-03 NOTE — Progress Notes (Signed)
Chief Complaint  Patient presents with  . New Patient (Initial Visit)   New patient no complaints today  1 HTN uncontrolled on Norvasc 5 mg Lotensin 40 mg qd. She is getting sbp 150s at home and repeat today 146/84  2. DEXA +osteoporosis never took Fosamax    Review of Systems  Constitutional: Negative for weight loss.  HENT: Negative for hearing loss.   Eyes:       No vision problems   Respiratory: Negative for shortness of breath.   Cardiovascular: Negative for chest pain.  Gastrointestinal: Negative for abdominal pain.  Musculoskeletal: Negative for falls.  Skin: Negative for rash.  Neurological: Negative for headaches.  Psychiatric/Behavioral: Negative for memory loss.   Past Medical History:  Diagnosis Date  . Breast discharge    LEFT BREAST -- Milky and Clear  . Hypertension   . Migraines    Past Surgical History:  Procedure Laterality Date  . ABDOMINAL HYSTERECTOMY     dub 2008/2009 h/o abnormal pap   . BACK SURGERY     cervical spine [place] [facility]  . CESAREAN SECTION     x2    Family History  Problem Relation Age of Onset  . Hypertension Mother   . AAA (abdominal aortic aneurysm) Mother   . Aneurysm Mother   . Cancer Brother        Colon  . Hypertension Brother   . Diabetes Daughter        type 1   . Asthma Sister   . Cancer Brother        colon cancer   . AAA (abdominal aortic aneurysm) Brother   . Anuerysm Brother   . Cancer Maternal Aunt        Breast  . Breast cancer Maternal Aunt   . Cancer Paternal Aunt        Breast  . Breast cancer Paternal Aunt    Social History   Socioeconomic History  . Marital status: Married    Spouse name: Not on file  . Number of children: Not on file  . Years of education: Not on file  . Highest education level: Not on file  Social Needs  . Financial resource strain: Not on file  . Food insecurity - worry: Not on file  . Food insecurity - inability: Not on file  . Transportation needs - medical: Not on file   . Transportation needs - non-medical: Not on file  Occupational History  . Not on file  Tobacco Use  . Smoking status: Never Smoker  . Smokeless tobacco: Never Used  Substance and Sexual Activity  . Alcohol use: No  . Drug use: No  . Sexual activity: Not on file  Other Topics Concern  . 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 alendronate (FOSAMAX) 70 MG tablet Take 1 tablet (70 mg total) by mouth once a week. Take with a full glass of water on an empty stomach.  Marland Kitchen amLODipine (NORVASC) 5 MG tablet Take 1 tablet (5 mg total) daily by mouth.  . benazepril (LOTENSIN) 40 MG tablet Take 1 tablet (40 mg total) daily by mouth.  . fluticasone (FLONASE) 50 MCG/ACT nasal spray Place 2 sprays into both nostrils daily.  . [DISCONTINUED] alendronate (FOSAMAX) 70 MG tablet Take 1 tablet (70 mg total) by mouth once a week. Take with a full glass  of water on an empty stomach.   No Known Allergies No results found for this or any previous visit (from the past 2160 hour(s)). Objective  Body mass index is 34.49 kg/m. Wt Readings from Last 3 Encounters:  02/28/18 176 lb 9.6 oz (80.1 kg)  11/05/17 178 lb 3.2 oz (80.8 kg)  07/01/17 173 lb 4.8 oz (78.6 kg)   Temp Readings from Last 3 Encounters:  02/28/18 98 F (36.7 C) (Oral)  11/05/17 97.8 F (36.6 C) (Oral)  07/01/17 97.7 F (36.5 C) (Oral)   BP Readings from Last 3 Encounters:  02/28/18 (!) 144/68  11/05/17 138/74  07/01/17 116/74   Pulse Readings from Last 3 Encounters:  02/28/18 78  11/05/17 66  07/01/17 71   O2 sat room air 97%  Physical Exam  Constitutional: She is oriented to person, place, and time and well-developed, well-nourished, and in no distress.  HENT:  Head: Normocephalic and atraumatic.  Mouth/Throat: Oropharynx is clear and moist and mucous membranes are normal.  Eyes: Conjunctivae are normal. Pupils are equal, round,  and reactive to light.  Cardiovascular: Normal rate and regular rhythm.  Murmur heard. Pulmonary/Chest: Effort normal and breath sounds normal.  Neurological: She is alert and oriented to person, place, and time. Gait normal. Gait normal.  Skin: Skin is warm, dry and intact.  Psychiatric: Mood, memory, affect and judgment normal.  Nursing note and vitals reviewed.   Assessment   1. HTN/HLD 2. Osteoporosis  3. HM Plan   1. Labs next week  Add hctz 12.5 to norvasc 5 and benazepril 40 mg qd  2.rec take fosamax  3.  Never had flu shot  Tdap per pt had summer 2019 need to check this  Check hep B status and hep C  mammo due 05/31/18  DEXA 06/17/17 +osteoporosis  05/01/17 pap neg neg HPV s/p hysterectomy and h/o abnormal pap sees Dr. [facility]itt [facility]  [facility] EGD and colonoscopy had <50 and >5 years need to get records. With FH 2 brothers colon cancer refer to Dr. Allen Norris   Provider: Dr. Olivia Mackie McLean-Scocuzza-Internal Medicine

## 2018-03-05 ENCOUNTER — Telehealth: Payer: Self-pay | Admitting: Gastroenterology

## 2018-03-05 NOTE — Telephone Encounter (Signed)
Joyce Simpson from [facility] left vm  Regards to pt colonoscopy she states no prior Josem Kaufmann is required if any questions call [phone]

## 2018-03-11 ENCOUNTER — Telehealth: Payer: Self-pay | Admitting: Internal Medicine

## 2018-03-11 ENCOUNTER — Other Ambulatory Visit: Payer: 59

## 2018-03-11 DIAGNOSIS — I10 Essential (primary) hypertension: Secondary | ICD-10-CM

## 2018-03-11 NOTE — Telephone Encounter (Signed)
LOV 02/28/18 as a new pt with Dr. Aundra Dubin.  F/U appt is 03/28/18  [place], [place].

## 2018-03-11 NOTE — Telephone Encounter (Signed)
Copied from [place] [id number]. Topic: Quick Communication - See Telephone Encounter >> Mar 11, 2018  7:36 AM Hewitt Shorts wrote: CRM for notification. See Telephone encounter for: pt is needing a refill on amlodipine and also benazepril  [facility] pharmacy   Best number for pt is [phone]   03/11/18.

## 2018-03-14 ENCOUNTER — Other Ambulatory Visit: Payer: Self-pay | Admitting: *Deleted

## 2018-03-14 DIAGNOSIS — I10 Essential (primary) hypertension: Secondary | ICD-10-CM

## 2018-03-14 MED ORDER — BENAZEPRIL HCL 40 MG PO TABS: 40.0000 mg | ORAL_TABLET | Freq: Every day | ORAL | 0 refills | Status: DC

## 2018-03-14 MED ORDER — AMLODIPINE BESYLATE 5 MG PO TABS: 5.0000 mg | ORAL_TABLET | Freq: Every day | ORAL | 0 refills | Status: DC

## 2018-03-14 NOTE — Progress Notes (Unsigned)
Medication refilled

## 2018-03-14 NOTE — Telephone Encounter (Signed)
Patient called back because she hadn't heard back about her refill for  amLODipine (NORVASC) 5 MG tablet and benazepril (LOTENSIN) 40 MG tablet. Pt uses  [place], [address] [place] [phone] (Phone) [phone] (Fax)

## 2018-03-14 NOTE — Telephone Encounter (Signed)
Meds were sent in today.

## 2018-03-20 ENCOUNTER — Other Ambulatory Visit (INDEPENDENT_AMBULATORY_CARE_PROVIDER_SITE_OTHER): Payer: 59

## 2018-03-20 DIAGNOSIS — Z13818 Encounter for screening for other digestive system disorders: Secondary | ICD-10-CM | POA: Diagnosis not present

## 2018-03-20 DIAGNOSIS — E538 Deficiency of other specified B group vitamins: Secondary | ICD-10-CM

## 2018-03-20 DIAGNOSIS — R739 Hyperglycemia, unspecified: Secondary | ICD-10-CM | POA: Diagnosis not present

## 2018-03-20 DIAGNOSIS — I10 Essential (primary) hypertension: Secondary | ICD-10-CM

## 2018-03-20 DIAGNOSIS — Z1159 Encounter for screening for other viral diseases: Secondary | ICD-10-CM

## 2018-03-20 DIAGNOSIS — Z113 Encounter for screening for infections with a predominantly sexual mode of transmission: Secondary | ICD-10-CM | POA: Diagnosis not present

## 2018-03-20 DIAGNOSIS — E559 Vitamin D deficiency, unspecified: Secondary | ICD-10-CM | POA: Diagnosis not present

## 2018-03-20 LAB — COMPREHENSIVE METABOLIC PANEL
ALT: 16 U/L (ref 0–35)
AST: 16 U/L (ref 0–37)
Albumin: 4.5 g/dL (ref 3.5–5.2)
Alkaline Phosphatase: 79 U/L (ref 39–117)
BUN: 9 mg/dL (ref 6–23)
CALCIUM: 9.5 mg/dL (ref 8.4–10.5)
CHLORIDE: 104 meq/L (ref 96–112)
CO2: 28 meq/L (ref 19–32)
CREATININE: 0.8 mg/dL (ref 0.40–1.20)
GFR: 95.01 mL/min (ref >60.00)
GLUCOSE: 105 mg/dL — AB (ref 70–99)
Potassium: 4 mEq/L (ref 3.5–5.1)
Sodium: 140 mEq/L (ref 135–145)
Total Bilirubin: 0.3 mg/dL (ref 0.2–1.2)
Total Protein: 7.1 g/dL (ref 6.0–8.3)

## 2018-03-20 LAB — URINALYSIS, ROUTINE W REFLEX MICROSCOPIC
BILIRUBIN URINE: NEGATIVE
KETONES UR: NEGATIVE
LEUKOCYTES UA: NEGATIVE
NITRITE: NEGATIVE
PH: 6.5 (ref 5.0–8.0)
SPECIFIC GRAVITY, URINE: 1.025 (ref 1.000–1.030)
TOTAL PROTEIN, URINE-UPE24: NEGATIVE
URINE GLUCOSE: NEGATIVE
UROBILINOGEN UA: 0.2 (ref 0.0–1.0)

## 2018-03-20 LAB — CBC WITH DIFFERENTIAL/PLATELET
BASOS ABS: 0 10*3/uL (ref 0.0–0.1)
BASOS PCT: 0.4 % (ref 0.0–3.0)
EOS ABS: 0.1 10*3/uL (ref 0.0–0.7)
Eosinophils Relative: 3.7 % (ref 0.0–5.0)
HEMATOCRIT: 42.1 % (ref 36.0–46.0)
Hemoglobin: 14.4 g/dL (ref 12.0–15.0)
LYMPHS ABS: 1.2 10*3/uL (ref 0.7–4.0)
LYMPHS PCT: 29.5 % (ref 12.0–46.0)
MCHC: 34.1 g/dL (ref 30.0–36.0)
MCV: 83.1 fl (ref 78.0–100.0)
Monocytes Absolute: 0.5 10*3/uL (ref 0.1–1.0)
Monocytes Relative: 12 % (ref 3.0–12.0)
NEUTROS ABS: 2.1 10*3/uL (ref 1.4–7.7)
Neutrophils Relative %: 54.4 % (ref 43.0–77.0)
PLATELETS: 235 10*3/uL (ref 150.0–400.0)
RBC: 5.07 Mil/uL (ref 3.87–5.11)
RDW: 13.1 % (ref 11.5–15.5)
WBC: 3.9 10*3/uL — ABNORMAL LOW (ref 4.0–10.5)

## 2018-03-20 LAB — VITAMIN D 25 HYDROXY (VIT D DEFICIENCY, FRACTURES): VITD: 15.8 ng/mL — AB (ref 30.00–100.00)

## 2018-03-20 LAB — LIPID PANEL
Cholesterol: 192 mg/dL (ref 0–200)
HDL: 61.2 mg/dL (ref >39.00)
LDL Cholesterol: 121 mg/dL — ABNORMAL HIGH (ref 0–99)
NONHDL: 131.11
Total CHOL/HDL Ratio: 3
Triglycerides: 51 mg/dL (ref 0.0–149.0)
VLDL: 10.2 mg/dL (ref 0.0–40.0)

## 2018-03-20 LAB — VITAMIN B12: VITAMIN B 12: 533 pg/mL (ref 211–911)

## 2018-03-20 LAB — HEMOGLOBIN A1C: HEMOGLOBIN A1C: 5.8 % (ref 4.6–6.5)

## 2018-03-21 LAB — HEPATITIS C ANTIBODY
HEP C AB: NONREACTIVE
SIGNAL TO CUT-OFF: 0.02 (ref <1.00)

## 2018-03-21 LAB — HEPATITIS B SURFACE ANTIBODY, QUANTITATIVE

## 2018-03-21 LAB — HEPATITIS B SURFACE ANTIGEN: HEP B S AG: NONREACTIVE

## 2018-03-21 LAB — HIV ANTIBODY (ROUTINE TESTING W REFLEX): HIV 1&2 Ab, 4th Generation: NONREACTIVE

## 2018-03-26 ENCOUNTER — Other Ambulatory Visit: Payer: Self-pay | Admitting: Internal Medicine

## 2018-03-26 MED ORDER — CHOLECALCIFEROL 1.25 MG (50000 UT) PO CAPS: 50000.0000 [IU] | ORAL_CAPSULE | ORAL | 1 refills | Status: DC

## 2018-03-28 ENCOUNTER — Ambulatory Visit: Payer: 59 | Admitting: Internal Medicine

## 2018-03-28 ENCOUNTER — Encounter: Payer: Self-pay | Admitting: Internal Medicine

## 2018-03-28 VITALS — BP 110/60 | HR 85 | Temp 98.3°F | Ht 60.0 in | Wt 173.8 lb

## 2018-03-28 DIAGNOSIS — M810 Age-related osteoporosis without current pathological fracture: Secondary | ICD-10-CM | POA: Diagnosis not present

## 2018-03-28 DIAGNOSIS — Z23 Encounter for immunization: Secondary | ICD-10-CM

## 2018-03-28 DIAGNOSIS — Z1231 Encounter for screening mammogram for malignant neoplasm of breast: Secondary | ICD-10-CM | POA: Diagnosis not present

## 2018-03-28 DIAGNOSIS — I10 Essential (primary) hypertension: Secondary | ICD-10-CM

## 2018-03-28 DIAGNOSIS — E785 Hyperlipidemia, unspecified: Secondary | ICD-10-CM | POA: Diagnosis not present

## 2018-03-28 NOTE — Patient Instructions (Signed)
Hepatitis B shot today  Nurse visit for hep B shot 2 in 1-2 months  Shot 3 due 6 months from 03/28/18    Hepatitis B Vaccine, Recombinant injection What is this medicine? HEPATITIS B VACCINE (hep uh TAHY tis B VAK seen) is a vaccine. It is used to prevent an infection with the hepatitis B virus. This medicine may be used for other purposes; ask your health care provider or pharmacist if you have questions. COMMON BRAND NAME(S): Engerix-B, Recombivax HB What should I tell my health care provider before I take this medicine? They need to know if you have any of these conditions: -fever, infection -heart disease -hepatitis B infection -immune system problems -kidney disease -an unusual or allergic reaction to vaccines, yeast, other medicines, foods, dyes, or preservatives -pregnant or trying to get pregnant -breast-feeding How should I use this medicine? This vaccine is for injection into a muscle. It is given by a health care professional. A copy of Vaccine Information Statements will be given before each vaccination. Read this sheet carefully each time. The sheet may change frequently. Talk to your pediatrician regarding the use of this medicine in children. While this drug may be prescribed for children as young as newborn for selected conditions, precautions do apply. Overdosage: If you think you have taken too much of this medicine contact a poison control center or emergency room at once. NOTE: This medicine is only for you. Do not share this medicine with others. What if I miss a dose? It is important not to miss your dose. Call your doctor or health care professional if you are unable to keep an appointment. What may interact with this medicine? -medicines that suppress your immune function like adalimumab, anakinra, infliximab -medicines to treat cancer -steroid medicines like prednisone or cortisone This list may not describe all possible interactions. Give your health care  provider a list of all the medicines, herbs, non-prescription drugs, or dietary supplements you use. Also tell them if you smoke, drink alcohol, or use illegal drugs. Some items may interact with your medicine. What should I watch for while using this medicine? See your health care provider for all shots of this vaccine as directed. You must have 3 shots of this vaccine for protection from hepatitis B infection. Tell your doctor right away if you have any serious or unusual side effects after getting this vaccine. What side effects may I notice from receiving this medicine? Side effects that you should report to your doctor or health care professional as soon as possible: -allergic reactions like skin rash, itching or hives, swelling of the face, lips, or tongue -breathing problems -confused, irritated -fast, irregular heartbeat -flu-like syndrome -numb, tingling pain -seizures -unusually weak or tired Side effects that usually do not require medical attention (report to your doctor or health care professional if they continue or are bothersome): -diarrhea -fever -headache -loss of appetite -muscle pain -nausea -pain, redness, swelling, or irritation at site where injected -tiredness This list may not describe all possible side effects. Call your doctor for medical advice about side effects. You may report side effects to FDA at 1-800-FDA-1088. Where should I keep my medicine? This drug is given in a hospital or clinic and will not be stored at home. NOTE: This sheet is a summary. It may not cover all possible information. If you have questions about this medicine, talk to your doctor, pharmacist, or health care provider.  2018 Elsevier/Gold Standard (2014-04-19 13:26:01)

## 2018-03-28 NOTE — Progress Notes (Signed)
Pre visit review using our clinic review tool, if applicable. No additional management support is needed unless otherwise documented below in the visit note. 

## 2018-03-31 ENCOUNTER — Encounter: Payer: Self-pay | Admitting: Internal Medicine

## 2018-03-31 MED ORDER — CYANOCOBALAMIN 1000 MCG/ML IJ SOLN: 1000.0000 ug | Freq: Once | INTRAMUSCULAR | Status: DC

## 2018-03-31 NOTE — Progress Notes (Addendum)
Chief Complaint  Patient presents with  . Follow-up   F/u  HTN/HLD reviewed labs  HTN improved on norvasc 5 ,hctz 12.5 mg qd   Review of Systems  Constitutional: Negative for weight loss.  HENT: Negative for hearing loss.   Eyes: Negative for blurred vision.  Respiratory: Negative for shortness of breath.   Cardiovascular: Negative for chest pain.  Gastrointestinal: Negative for abdominal pain.  Skin: Negative for rash.  Neurological: Negative for headaches.  Psychiatric/Behavioral: Negative for depression.       +sister went to hospital today for stroke    Past Medical History:  Diagnosis Date  . Breast discharge    LEFT BREAST -- Milky and Clear  . Diverticulosis   . Heart murmur   . Hyperlipidemia   . Hypertension   . Migraines    Past Surgical History:  Procedure Laterality Date  . ABDOMINAL HYSTERECTOMY     dub 2008/2009 h/o abnormal pap   . BACK SURGERY     cervical spine [place]  . CESAREAN SECTION     x2    Family History  Problem Relation Age of Onset  . Hypertension Mother   . AAA (abdominal aortic aneurysm) Mother   . Aneurysm Mother   . Cancer Brother        Colon  . Hypertension Brother   . Diabetes Daughter        type 1   . Asthma Sister   . Cancer Brother        colon cancer   . AAA (abdominal aortic aneurysm) Brother   . Anuerysm Brother   . Cancer Maternal Aunt        Breast  . Breast cancer Maternal Aunt   . Cancer Paternal Aunt        Breast  . Breast cancer Paternal Aunt    Social History   Socioeconomic History  . Marital status: Married    Spouse name: Not on file  . Number of children: Not on file  . Years of education: Not on file  . Highest education level: Not on file  Occupational History  . Not on file  Social Needs  . Financial resource strain: Not on file  . Food insecurity:    Worry: Not on file    Inability: Not on file  . Transportation needs:    Medical: Not on file    Non-medical: Not on file   Tobacco Use  . Smoking status: Never Smoker  . Smokeless tobacco: Never Used  Substance and Sexual Activity  . Alcohol use: No  . Drug use: No  . Sexual activity: Not on file  Lifestyle  . Physical activity:    Days per week: Not on file    Minutes per session: Not on file  . Stress: Not on file  Relationships  . Social connections:    Talks on phone: Not on file    Gets together: Not on file    Attends religious service: Not on file    Active member of club or organization: Not on file    Attends meetings of clubs or organizations: Not on file    Relationship status: Not on file  . Intimate partner violence:    Fear of current or ex partner: Not on file    Emotionally abused: Not on file    Physically abused: Not on file    Forced sexual activity: Not on file  Other Topics Concern  . 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 alendronate (FOSAMAX) 70 MG tablet Take 1 tablet (70 mg total) by mouth once a week. Take with a full glass of water on an empty stomach.  Marland Kitchen amLODipine (NORVASC) 5 MG tablet Take 1 tablet (5 mg total) by mouth daily.  . benazepril (LOTENSIN) 40 MG tablet Take 1 tablet (40 mg total) by mouth daily.  . Cholecalciferol 50000 units capsule Take 1 capsule (50,000 Units total) by mouth once a week.  . fluticasone (FLONASE) 50 MCG/ACT nasal spray Place 2 sprays into both nostrils daily.  . hydrochlorothiazide (HYDRODIURIL) 12.5 MG tablet Take 1 tablet (12.5 mg total) by mouth daily.   No Known Allergies Recent Results (from the past 2160 hour(s))  HIV antibody (with reflex)     Status: None   Collection Time: 03/20/18  9:18 AM  Result Value Ref Range   HIV 1&2 Ab, 4th Generation NON-REACTIVE NON-REACTI    Comment: HIV-1 antigen and HIV-1/HIV-2 antibodies were not detected. There is no laboratory  evidence of HIV infection. Marland Kitchen PLEASE NOTE: This information has been disclosed to you from records whose confidentiality may be protected by state law.  If your state requires such protection, then the state law prohibits you from making any further disclosure of the information without the specific written consent of the person to whom it pertains, or as otherwise permitted by law. A general authorization for the release of medical or other information is NOT sufficient for this purpose. . For additional information please refer to http://education.questdiagnostics.com/faq/FAQ106 (This link is being provided for informational/ educational purposes only.) . Marland Kitchen The performance of this assay has not been clinically validated in patients less than 60 years old. .   Hepatitis C antibody     Status: None   Collection Time: 03/20/18  9:18 AM  Result Value Ref Range   Hepatitis C Ab NON-REACTIVE NON-REACTI   SIGNAL TO CUT-OFF 0.02 <1.00    Comment: . HCV antibody was non-reactive. There is no laboratory  evidence of HCV infection. . In most cases, no further action is required. However, if recent HCV exposure is suspected, a test for HCV RNA (test code 9144198564) is suggested. . For additional information please refer to http://education.questdiagn[facility].com/faq/FAQ22v1 (This link is being provided for informational/ educational purposes only.) .   Hepatitis B surface antigen     Status: None   Collection Time: 03/20/18  9:18 AM  Result Value Ref Range   Hepatitis B Surface Ag NON-REACTIVE NON-REACTI  Hepatitis B surface antibody     Status: Abnormal   Collection Time: 03/20/18  9:18 AM  Result Value Ref Range   Hepatitis B-Post <5 (L) > OR = 10 mIU/mL    Comment: . Patient does not have immunity to hepatitis B virus. . For additional information, please refer to http://education.questdiagnostics.com/faq/FAQ105 (This link is being provided for informational/ educational  purposes only).   Vitamin D (25 hydroxy)     Status: Abnormal   Collection Time: 03/20/18  9:18 AM  Result Value Ref Range   VITD 15.80 (L) 30.00 - 100.00 ng/mL  B12     Status: None   Collection Time: 03/20/18  9:18 AM  Result Value Ref Range   Vitamin B-12 533 211 - 911 pg/mL  Lipid panel  Status: Abnormal   Collection Time: 03/20/18  9:18 AM  Result Value Ref Range   Cholesterol 192 0 - 200 mg/dL    Comment: ATP III Classification       Desirable:  < 200 mg/dL               Borderline High:  200 - 239 mg/dL          High:  > = 240 mg/dL   Triglycerides 51.0 0.0 - 149.0 mg/dL    Comment: Normal:  <150 mg/dLBorderline High:  150 - 199 mg/dL   HDL 61.20 >39.00 mg/dL   VLDL 10.2 0.0 - 40.0 mg/dL   LDL Cholesterol 121 (H) 0 - 99 mg/dL   Total CHOL/HDL Ratio 3     Comment:                Men          Women1/2 Average Risk     3.4          3.3Average Risk          5.0          4.42X Average Risk          9.6          7.13X Average Risk          15.0          11.0                       NonHDL 131.11     Comment: NOTE:  Non-HDL goal should be 30 mg/dL higher than patient's LDL goal (i.e. LDL goal of < 70 mg/dL, would have non-HDL goal of < 100 mg/dL)  Hemoglobin A1c     Status: None   Collection Time: 03/20/18  9:18 AM  Result Value Ref Range   Hgb A1c MFr Bld 5.8 4.6 - 6.5 %    Comment: Glycemic Control Guidelines for People with Diabetes:Non Diabetic:  <6%Goal of Therapy: <7%Additional Action Suggested:  >8%   Urinalysis, Routine w reflex microscopic     Status: Abnormal   Collection Time: 03/20/18  9:18 AM  Result Value Ref Range   Color, Urine YELLOW Yellow;Lt. Yellow   APPearance CLEAR Clear   Specific Gravity, Urine 1.025 1.000 - 1.030   pH 6.5 5.0 - 8.0   Total Protein, Urine NEGATIVE Negative   Urine Glucose NEGATIVE Negative   Ketones, ur NEGATIVE Negative   Bilirubin Urine NEGATIVE Negative   Hgb urine dipstick TRACE-INTACT (A) Negative   Urobilinogen, UA 0.2 0.0 - 1.0    Leukocytes, UA NEGATIVE Negative   Nitrite NEGATIVE Negative   WBC, UA 0-2/hpf 0-2/hpf   RBC / HPF 0-2/hpf 0-2/hpf   Mucus, UA Presence of (A) None   Squamous Epithelial / LPF Rare(0-4/hpf) Rare(0-4/hpf)  CBC with Differential/Platelet     Status: Abnormal   Collection Time: 03/20/18  9:18 AM  Result Value Ref Range   WBC 3.9 (L) 4.0 - 10.5 K/uL   RBC 5.07 3.87 - 5.11 Mil/uL   Hemoglobin 14.4 12.0 - 15.0 g/dL   HCT 42.1 36.0 - 46.0 %   MCV 83.1 78.0 - 100.0 fl   MCHC 34.1 30.0 - 36.0 g/dL   RDW 13.1 11.5 - 15.5 %   Platelets 235.0 150.0 - 400.0 K/uL   Neutrophils Relative % 54.4 43.0 - 77.0 %   Lymphocytes Relative 29.5 12.0 - 46.0 %   Monocytes Relative  12.0 3.0 - 12.0 %   Eosinophils Relative 3.7 0.0 - 5.0 %   Basophils Relative 0.4 0.0 - 3.0 %   Neutro Abs 2.1 1.4 - 7.7 K/uL   Lymphs Abs 1.2 0.7 - 4.0 K/uL   Monocytes Absolute 0.5 0.1 - 1.0 K/uL   Eosinophils Absolute 0.1 0.0 - 0.7 K/uL   Basophils Absolute 0.0 0.0 - 0.1 K/uL  Comprehensive metabolic panel     Status: Abnormal   Collection Time: 03/20/18  9:18 AM  Result Value Ref Range   Sodium 140 135 - 145 mEq/L   Potassium 4.0 3.5 - 5.1 mEq/L   Chloride 104 96 - 112 mEq/L   CO2 28 19 - 32 mEq/L   Glucose, Bld 105 (H) 70 - 99 mg/dL   BUN 9 6 - 23 mg/dL   Creatinine, Ser 0.80 0.40 - 1.20 mg/dL   Total Bilirubin 0.3 0.2 - 1.2 mg/dL   Alkaline Phosphatase 79 39 - 117 U/L   AST 16 0 - 37 U/L   ALT 16 0 - 35 U/L   Total Protein 7.1 6.0 - 8.3 g/dL   Albumin 4.5 3.5 - 5.2 g/dL   Calcium 9.5 8.4 - 10.5 mg/dL   GFR 95.01 >60.00 mL/min   Objective  Body mass index is 33.94 kg/m. Wt Readings from Last 3 Encounters:  03/28/18 173 lb 12.8 oz (78.8 kg)  02/28/18 176 lb 9.6 oz (80.1 kg)  11/05/17 178 lb 3.2 oz (80.8 kg)   Temp Readings from Last 3 Encounters:  03/28/18 98.3 F (36.8 C) (Oral)  02/28/18 98 F (36.7 C) (Oral)  11/05/17 97.8 F (36.6 C) (Oral)   BP Readings from Last 3 Encounters:  03/28/18  110/60  02/28/18 (!) 144/68  11/05/17 138/74   Pulse Readings from Last 3 Encounters:  03/28/18 85  02/28/18 78  11/05/17 66    Physical Exam  Constitutional: She is oriented to person, place, and time and well-developed, well-nourished, and in no distress. Vital signs are normal.  HENT:  Head: Normocephalic and atraumatic.  Mouth/Throat: Oropharynx is clear and moist and mucous membranes are normal.  Eyes: Pupils are equal, round, and reactive to light. Conjunctivae are normal.  Cardiovascular: Normal rate, regular rhythm and normal heart sounds.  Pulmonary/Chest: Effort normal and breath sounds normal.  Neurological: She is alert and oriented to person, place, and time. Gait normal. Gait normal.  Skin: Skin is warm, dry and intact.  Psychiatric: Mood, memory, affect and judgment normal.  Nursing note and vitals reviewed.   Assessment   1. HTN/HLD  2. Leukopenia  3. Trace hematuria  4. HM  Plan  1. Cont norvasc 5 mg qd, hctz 12.5 mg qd  Given cholesterol handout  2 and 3. Repeat CBC and UA at f/u send UA to [facility]  4.  Never had flu shot  Tdap per pt had summer 2019 need to check this  Hep c neg  Hep B vaccine today will need 2 more doses not immune  NOT IN [facility] VX DATABASE   mammo due 05/31/18 ordered today pt needs to schedule  DEXA 06/17/17 +osteoporosis due again 03/04/2019 after 1 year of fosamax  05/01/17 pap neg neg HPV s/p hysterectomy and h/o abnormal pap sees Dr. [facility]  [facility] EGD and colonoscopy had <50 and >5 years need to get records. With FH 2 brothers colon cancer refer to Dr. Allen Norris pt has appt colonoscoyp 04/11/18 Dr. Audria Nine MDs   Provider: Dr. Olivia Mackie McLean-Scocuzza-Internal  Medicine

## 2018-04-11 ENCOUNTER — Encounter
Admission: RE | Disposition: A | Discharge status: Home / Self Care | Payer: Self-pay | Source: Ambulatory Visit | Attending: Gastroenterology

## 2018-04-11 ENCOUNTER — Ambulatory Visit: Payer: 59 | Admitting: Certified Registered"

## 2018-04-11 ENCOUNTER — Ambulatory Visit
Admission: RE | Admit: 2018-04-11 | Discharge: 2018-04-11 | Disposition: A | Discharge status: Home / Self Care | Payer: 59 | Source: Ambulatory Visit | Attending: Gastroenterology | Admitting: Gastroenterology

## 2018-04-11 ENCOUNTER — Encounter: Payer: Self-pay | Admitting: *Deleted

## 2018-04-11 DIAGNOSIS — I10 Essential (primary) hypertension: Secondary | ICD-10-CM | POA: Diagnosis not present

## 2018-04-11 DIAGNOSIS — Z800 Family history of malignant neoplasm of digestive organs: Secondary | ICD-10-CM | POA: Diagnosis not present

## 2018-04-11 DIAGNOSIS — K219 Gastro-esophageal reflux disease without esophagitis: Secondary | ICD-10-CM | POA: Diagnosis not present

## 2018-04-11 DIAGNOSIS — Z7951 Long term (current) use of inhaled steroids: Secondary | ICD-10-CM | POA: Insufficient documentation

## 2018-04-11 DIAGNOSIS — E785 Hyperlipidemia, unspecified: Secondary | ICD-10-CM | POA: Insufficient documentation

## 2018-04-11 DIAGNOSIS — Z79899 Other long term (current) drug therapy: Secondary | ICD-10-CM | POA: Insufficient documentation

## 2018-04-11 DIAGNOSIS — D122 Benign neoplasm of ascending colon: Secondary | ICD-10-CM | POA: Diagnosis not present

## 2018-04-11 DIAGNOSIS — Z1211 Encounter for screening for malignant neoplasm of colon: Secondary | ICD-10-CM | POA: Diagnosis not present

## 2018-04-11 DIAGNOSIS — K635 Polyp of colon: Secondary | ICD-10-CM | POA: Diagnosis not present

## 2018-04-11 DIAGNOSIS — Z8249 Family history of ischemic heart disease and other diseases of the circulatory system: Secondary | ICD-10-CM | POA: Insufficient documentation

## 2018-04-11 DIAGNOSIS — Z803 Family history of malignant neoplasm of breast: Secondary | ICD-10-CM | POA: Insufficient documentation

## 2018-04-11 HISTORY — PX: COLONOSCOPY WITH PROPOFOL: SHX5780

## 2018-04-11 SURGERY — COLONOSCOPY WITH PROPOFOL
Anesthesia: General

## 2018-04-11 MED ORDER — PROPOFOL 10 MG/ML IV BOLUS
INTRAVENOUS | Status: AC
  Filled 2018-04-11: qty 20

## 2018-04-11 MED ORDER — PROPOFOL 500 MG/50ML IV EMUL
INTRAVENOUS | Status: AC
  Filled 2018-04-11: qty 50

## 2018-04-11 MED ORDER — PROPOFOL 500 MG/50ML IV EMUL
INTRAVENOUS | Status: DC | PRN
  Administered 2018-04-11: 200 ug/kg/min via INTRAVENOUS

## 2018-04-11 MED ORDER — SODIUM CHLORIDE 0.9 % IV SOLN
INTRAVENOUS | Status: DC
  Administered 2018-04-11: 1000 mL via INTRAVENOUS

## 2018-04-11 MED ORDER — PROPOFOL 10 MG/ML IV BOLUS
INTRAVENOUS | Status: DC | PRN
  Administered 2018-04-11: 70 mg via INTRAVENOUS

## 2018-04-11 MED ORDER — LIDOCAINE HCL 1 % IJ SOLN
INTRAMUSCULAR | Status: DC | PRN
  Administered 2018-04-11: 100 mg via INTRADERMAL

## 2018-04-11 NOTE — Transfer of Care (Signed)
Immediate Anesthesia Transfer of Care Note  Patient: Joyce Simpson  Procedure(s) Performed: COLONOSCOPY WITH PROPOFOL (N/A )  Patient Location: PACU and Endoscopy Unit  Anesthesia Type:General  Level of Consciousness: awake  Airway & Oxygen Therapy: Patient Spontanous Breathing  Post-op Assessment: Report given to RN  Post vital signs: stable  Last Vitals:  Vitals Value Taken Time  BP 118/69 04/11/2018  9:47 AM  Temp 36.1 C 04/11/2018  9:48 AM  Pulse 73 04/11/2018  9:48 AM  Resp 17 04/11/2018  9:48 AM  SpO2 99 % 04/11/2018  9:48 AM  Vitals shown include unvalidated device data.  Last Pain:  Vitals:   04/11/18 0948  TempSrc: Tympanic  PainSc:          Complications: No apparent anesthesia complications

## 2018-04-11 NOTE — Anesthesia Post-op Follow-up Note (Signed)
Anesthesia QCDR form completed.        

## 2018-04-11 NOTE — Anesthesia Preprocedure Evaluation (Signed)
Anesthesia Evaluation  Patient identified by MRN, date of birth, ID band Patient awake    Reviewed: Allergy & Precautions, H&P , NPO status , Patient's Chart, lab work & pertinent test results  Airway Mallampati: III  TM Distance: <3 FB Neck ROM: full    Dental  (+) Chipped, Poor Dentition, Missing, Partial Upper   Pulmonary neg pulmonary ROS, neg shortness of breath,           Cardiovascular Exercise Tolerance: Good hypertension, (-) angina(-) Past MI and (-) DOE      Neuro/Psych  Headaches, negative psych ROS   GI/Hepatic negative GI ROS, Neg liver ROS, neg GERD  ,  Endo/Other  negative endocrine ROS  Renal/GU negative Renal ROS  negative genitourinary   Musculoskeletal   Abdominal   Peds  Hematology negative hematology ROS (+)   Anesthesia Other Findings Past Medical History: No date: Breast discharge     Comment:  LEFT BREAST -- Milky and Clear No date: Diverticulosis No date: Heart murmur No date: Hyperlipidemia No date: Hypertension No date: Migraines  Past Surgical History: No date: ABDOMINAL HYSTERECTOMY     Comment:  dub 2008/2009 h/o abnormal pap  No date: BACK SURGERY     Comment:  cervical spine [place] NS No date: CESAREAN SECTION     Comment:  x2   BMI    Body Mass Index:  33.79 kg/m      Reproductive/Obstetrics negative OB ROS                             Anesthesia Physical  Anesthesia Plan  ASA: III  Anesthesia Plan: General   Post-op Pain Management:    Induction: Intravenous  PONV Risk Score and Plan: Propofol infusion and TIVA  Airway Management Planned: Natural Airway and Nasal Cannula  Additional Equipment:   Intra-op Plan:   Post-operative Plan:   Informed Consent: I have reviewed the patients History and Physical, chart, labs and discussed the procedure including the risks, benefits and alternatives for the proposed anesthesia with the  patient or authorized representative who has indicated his/her understanding and acceptance.   Dental Advisory Given  Plan Discussed with: Anesthesiologist, CRNA and Surgeon  Anesthesia Plan Comments: (Patient consented for risks of anesthesia including but not limited to:  - adverse reactions to medications - risk of intubation if required - damage to teeth, lips or other oral mucosa - sore throat or hoarseness - Damage to heart, brain, lungs or loss of life  Patient voiced understanding.)        Anesthesia Quick Evaluation  

## 2018-04-11 NOTE — Op Note (Signed)
[facility] Gastroenterology Patient Name: Joyce Simpson Procedure Date: 04/11/2018 9:17 AM MRN: [id number] Account #: [id number] Date of Birth: Jun 04, 1961 Admit Type: Ambulatory Age: 57 Room: [facility] ENDO ROOM 4 Gender: Female Note Status: Finalized Procedure:            Colonoscopy Indications:          Screening for colorectal malignant neoplasm Providers:            Jonathon Bellows MD, MD Medicines:            Monitored Anesthesia Care Complications:        No immediate complications. Procedure:            Pre-Anesthesia Assessment:                       - Prior to the procedure, a History and Physical was                        performed, and patient medications, allergies and                        sensitivities were reviewed. The patient's tolerance of                        previous anesthesia was reviewed.                       - The risks and benefits of the procedure and the                        sedation options and risks were discussed with the                        patient. All questions were answered and informed                        consent was obtained.                       - ASA Grade Assessment: II - A patient with mild                        systemic disease.                       After obtaining informed consent, the colonoscope was                        passed under direct vision. Throughout the procedure,                        the patient's blood pressure, pulse, and oxygen                        saturations were monitored continuously. The                        Colonoscope was introduced through the anus and                        advanced to the the cecum, identified by the  appendiceal orifice, IC valve and transillumination.                        The colonoscopy was performed with ease. The patient                        tolerated the procedure well. The quality of the bowel                        preparation was  poor. Findings:      The perianal and digital rectal examinations were normal. [Pertinent       Negatives].      A large amount of semi-liquid stool was found in the entire colon,       precluding visualization.      Two pedunculated polyps were found in the ascending colon. The polyps       were 15 to 20 mm in size. These polyps were removed with a hot snare.       Resection and retrieval were complete. To prevent bleeding after the       polypectomy, two hemostatic clips were successfully placed. There was no       bleeding during, or at the end, of the procedure. Impression:           - Preparation of the colon was poor.                       - Stool in the entire examined colon.                       - Two 15 to 20 mm polyps in the ascending colon,                        removed with a hot snare. Resected and retrieved. Clips                        were placed. Recommendation:       - Discharge patient to home.                       - Resume previous diet.                       - Continue present medications.                       - Await pathology results.                       - Repeat colonoscopy in 4 weeks because the bowel                        preparation was suboptimal. Procedure Code(s):    --- Professional ---                       [phone], Colonoscopy, flexible; with removal of tumor(s),                        polyp(s), or other lesion(s) by snare technique Diagnosis Code(s):    --- Professional ---  Z12.11, Encounter for screening for malignant neoplasm                        of colon                       D12.2, Benign neoplasm of ascending colon CPT copyright 2017 American Medical Association. All rights reserved. The codes documented in this report are preliminary and upon coder review may  be revised to meet current compliance requirements. Jonathon Bellows, MD Jonathon Bellows MD, MD 04/11/2018 9:41:32 AM This report has been signed  electronically. Number of Addenda: 0 Note Initiated On: 04/11/2018 9:17 AM Scope Withdrawal Time: 0 hours 13 minutes 10 seconds  Total Procedure Duration: 0 hours 17 minutes 25 seconds       [facility]

## 2018-04-11 NOTE — Anesthesia Postprocedure Evaluation (Signed)
Anesthesia Post Note  Patient: Joyce Simpson  Procedure(s) Performed: COLONOSCOPY WITH PROPOFOL (N/A )  Patient location during evaluation: Endoscopy Anesthesia Type: General Level of consciousness: awake and alert Pain management: pain level controlled Vital Signs Assessment: post-procedure vital signs reviewed and stable Respiratory status: spontaneous breathing, nonlabored ventilation, respiratory function stable and patient connected to nasal cannula oxygen Cardiovascular status: blood pressure returned to baseline and stable Postop Assessment: no apparent nausea or vomiting Anesthetic complications: no     Last Vitals:  Vitals:   04/11/18 0857 04/11/18 0948  BP: (!) 148/87   Pulse: 61   Resp: 20   Temp: (!) 36.2 C (!) 36.1 C  SpO2: 100%     Last Pain:  Vitals:   04/11/18 0948  TempSrc: Tympanic  PainSc:                  Precious Haws Piscitello

## 2018-04-11 NOTE — H&P (Signed)
Jonathon Bellows, MD [address], [place], [place], [place], [address] [address], [place], [place], [place], [address] Phone: [phone]  Fax: [phone]  Primary Care Physician:  McLean-Scocuzza, Nino Glow, MD   Pre-Procedure History & Physical: HPI:  Joyce Simpson is a 57 y.o. female is here for an colonoscopy.   Past Medical History:  Diagnosis Date  . Breast discharge    LEFT BREAST -- Milky and Clear  . Diverticulosis   . Heart murmur   . Hyperlipidemia   . Hypertension   . Migraines     Past Surgical History:  Procedure Laterality Date  . ABDOMINAL HYSTERECTOMY     dub 2008/2009 h/o abnormal pap   . BACK SURGERY     cervical spine [place] NS  . CESAREAN SECTION     x2     Prior to Admission medications   Medication Sig Start Date End Date Taking? Authorizing Provider  alendronate (FOSAMAX) 70 MG tablet Take 1 tablet (70 mg total) by mouth once a week. Take with a full glass of water on an empty stomach. 02/28/18   McLean-Scocuzza, Nino Glow, MD  amLODipine (NORVASC) 5 MG tablet Take 1 tablet (5 mg total) by mouth daily. 03/14/18   McLean-Scocuzza, Nino Glow, MD  benazepril (LOTENSIN) 40 MG tablet Take 1 tablet (40 mg total) by mouth daily. 03/14/18   McLean-Scocuzza, Nino Glow, MD  Cholecalciferol 50000 units capsule Take 1 capsule (50,000 Units total) by mouth once a week. 03/26/18   McLean-Scocuzza, Nino Glow, MD  fluticasone (FLONASE) 50 MCG/ACT nasal spray Place 2 sprays into both nostrils daily. 03/29/17   Roselee Nova, MD  hydrochlorothiazide (HYDRODIURIL) 12.5 MG tablet Take 1 tablet (12.5 mg total) by mouth daily. 02/28/18   McLean-Scocuzza, Nino Glow, MD    Allergies as of 03/03/2018  . (No Known Allergies)    Family History  Problem Relation Age of Onset  . Hypertension Mother   . AAA (abdominal aortic aneurysm) Mother   . Aneurysm Mother   . Cancer Brother        Colon  . Hypertension Brother   . Diabetes Daughter        type 1   . Asthma  Sister   . Cancer Brother        colon cancer   . AAA (abdominal aortic aneurysm) Brother   . Anuerysm Brother   . Cancer Maternal Aunt        Breast  . Breast cancer Maternal Aunt   . Cancer Paternal Aunt        Breast  . Breast cancer Paternal Aunt     Social History   Socioeconomic History  . Marital status: Married    Spouse name: Not on file  . Number of children: Not on file  . Years of education: Not on file  . Highest education level: Not on file  Occupational History  . Not on file  Social Needs  . Financial resource strain: Not on file  . Food insecurity:    Worry: Not on file    Inability: Not on file  . Transportation needs:    Medical: Not on file    Non-medical: Not on file  Tobacco Use  . Smoking status: Never Smoker  . Smokeless tobacco: Never Used  Substance and Sexual Activity  . Alcohol use: No  . Drug use: No  . Sexual activity: Not on file  Lifestyle  . Physical activity:  Days per week: Not on file    Minutes per session: Not on file  . Stress: Not on file  Relationships  . Social connections:    Talks on phone: Not on file    Gets together: Not on file    Attends religious service: Not on file    Active member of club or organization: Not on file    Attends meetings of clubs or organizations: Not on file    Relationship status: Not on file  . Intimate partner violence:    Fear of current or ex partner: Not on file    Emotionally abused: Not on file    Physically abused: Not on file    Forced sexual activity: Not on file  Other Topics Concern  . 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 General:   Alert,  pleasant and cooperative in NAD Head:  Normocephalic and atraumatic. Neck:  Supple; no  masses or thyromegaly. Lungs:  Clear throughout to auscultation, normal respiratory effort.    Heart:  +S1, +S2, Regular rate and rhythm, No edema. Abdomen:  Soft, nontender and nondistended. Normal bowel sounds, without guarding, and without rebound.   Neurologic:  Alert and  oriented x4;  grossly normal neurologically.  Impression/Plan: Joyce Simpson is here for an colonoscopy to be performed for Screening colonoscopy high risk   Risks, benefits, limitations, and alternatives regarding  colonoscopy have been reviewed with the patient.  Questions have been answered.  All parties agreeable.   Jonathon Bellows, MD  04/11/2018, 8:24 AM

## 2018-04-15 LAB — SURGICAL PATHOLOGY

## 2018-04-21 ENCOUNTER — Telehealth: Payer: Self-pay

## 2018-04-21 ENCOUNTER — Other Ambulatory Visit: Payer: Self-pay

## 2018-04-21 DIAGNOSIS — Z860101 Personal history of adenomatous and serrated colon polyps: Secondary | ICD-10-CM

## 2018-04-21 DIAGNOSIS — Z86010 Personal history of colonic polyps: Secondary | ICD-10-CM

## 2018-04-21 NOTE — Progress Notes (Signed)
Advised patient of results per Dr. Vicente Males.   Sending 2- day prep via MyChart (when activated) and mail.   Scheduled repeat colonoscopy.

## 2018-04-21 NOTE — Telephone Encounter (Signed)
LVM for patient callback for results per Dr. Vicente Males.    - Inform both polyps were adenomas- due to poor prep suggest repeat with 2 day prep in 8-12 or 16 weeks

## 2018-04-22 ENCOUNTER — Telehealth: Payer: Self-pay | Admitting: Gastroenterology

## 2018-04-22 NOTE — Telephone Encounter (Signed)
Sharee Pimple from [facility] left vm no Prior Auth needed for pt procedure

## 2018-05-06 ENCOUNTER — Telehealth: Payer: Self-pay | Admitting: Gastroenterology

## 2018-05-06 NOTE — Telephone Encounter (Signed)
Pt  Is calling stating her rx is not at pharmacy please call it in to [place] and let the pt know when its send

## 2018-05-07 ENCOUNTER — Other Ambulatory Visit: Payer: Self-pay

## 2018-05-07 DIAGNOSIS — Z1211 Encounter for screening for malignant neoplasm of colon: Secondary | ICD-10-CM

## 2018-05-07 MED ORDER — PEG 3350-KCL-NA BICARB-NACL 420 G PO SOLR: 4000.0000 mL | Freq: Once | ORAL | 0 refills | Status: AC

## 2018-05-28 ENCOUNTER — Ambulatory Visit: Payer: 59

## 2018-05-29 ENCOUNTER — Ambulatory Visit: Payer: 59 | Admitting: Internal Medicine

## 2018-05-29 ENCOUNTER — Ambulatory Visit: Payer: 59

## 2018-06-02 ENCOUNTER — Telehealth: Payer: Self-pay

## 2018-06-02 NOTE — Telephone Encounter (Signed)
Patient contacted office to reschedule her colonoscopy due to her fathers death.  She has been moved from 06/06/18 to 06/20/18.  Notified Vickii in Endo at [facility].  Pt states she has her instructions and prep.  Thanks [employer]

## 2018-06-17 ENCOUNTER — Other Ambulatory Visit: Payer: Self-pay

## 2018-06-17 ENCOUNTER — Ambulatory Visit (INDEPENDENT_AMBULATORY_CARE_PROVIDER_SITE_OTHER): Payer: 59

## 2018-06-17 ENCOUNTER — Telehealth: Payer: Self-pay | Admitting: Internal Medicine

## 2018-06-17 DIAGNOSIS — Z23 Encounter for immunization: Secondary | ICD-10-CM

## 2018-06-17 DIAGNOSIS — I10 Essential (primary) hypertension: Secondary | ICD-10-CM

## 2018-06-17 MED ORDER — BENAZEPRIL HCL 40 MG PO TABS: 40.0000 mg | ORAL_TABLET | Freq: Every day | ORAL | 0 refills | Status: DC

## 2018-06-17 MED ORDER — AMLODIPINE BESYLATE 5 MG PO TABS: 5.0000 mg | ORAL_TABLET | Freq: Every day | ORAL | 0 refills | Status: DC

## 2018-06-17 NOTE — Telephone Encounter (Signed)
Pt needs refills on the follow medications; amLODipine (NORVASC) 5 MG tablet and benazepril (LOTENSIN) 40 MG tablet.  She is also asking to get a prescription for batches for motion sickness. She is flying very soon.

## 2018-06-17 NOTE — Telephone Encounter (Signed)
Refill given please advise on the patches.

## 2018-06-17 NOTE — Progress Notes (Signed)
Patient comes in for a hepatitis B vaccine. Administered in left deltoid IM. Patient tolerated well.

## 2018-06-19 ENCOUNTER — Telehealth: Payer: Self-pay | Admitting: Internal Medicine

## 2018-06-19 ENCOUNTER — Encounter: Payer: Self-pay | Admitting: Student

## 2018-06-19 ENCOUNTER — Other Ambulatory Visit: Payer: Self-pay | Admitting: Internal Medicine

## 2018-06-19 ENCOUNTER — Ambulatory Visit
Admission: RE | Admit: 2018-06-19 | Discharge: 2018-06-19 | Disposition: A | Discharge status: Home / Self Care | Payer: 59 | Source: Ambulatory Visit | Attending: Internal Medicine | Admitting: Internal Medicine

## 2018-06-19 DIAGNOSIS — Z1231 Encounter for screening mammogram for malignant neoplasm of breast: Secondary | ICD-10-CM | POA: Insufficient documentation

## 2018-06-19 DIAGNOSIS — T753XXA Motion sickness, initial encounter: Secondary | ICD-10-CM

## 2018-06-19 MED ORDER — SCOPOLAMINE 1 MG/3DAYS TD PT72: 1.0000 | MEDICATED_PATCH | TRANSDERMAL | 0 refills | Status: DC

## 2018-06-19 NOTE — Telephone Encounter (Signed)
She can buy chewable Dramamine at the drug strore in the future    Sent scopolamine patches   TMS

## 2018-06-20 ENCOUNTER — Ambulatory Visit
Admission: RE | Admit: 2018-06-20 | Discharge: 2018-06-20 | Disposition: A | Discharge status: Home / Self Care | Payer: 59 | Source: Ambulatory Visit | Attending: Gastroenterology | Admitting: Gastroenterology

## 2018-06-20 ENCOUNTER — Encounter
Admission: RE | Disposition: A | Discharge status: Home / Self Care | Payer: Self-pay | Source: Ambulatory Visit | Attending: Gastroenterology

## 2018-06-20 ENCOUNTER — Encounter: Payer: Self-pay | Admitting: Anesthesiology

## 2018-06-20 ENCOUNTER — Ambulatory Visit: Payer: 59 | Admitting: Anesthesiology

## 2018-06-20 DIAGNOSIS — Z1211 Encounter for screening for malignant neoplasm of colon: Secondary | ICD-10-CM | POA: Insufficient documentation

## 2018-06-20 DIAGNOSIS — D123 Benign neoplasm of transverse colon: Secondary | ICD-10-CM | POA: Insufficient documentation

## 2018-06-20 DIAGNOSIS — Z7983 Long term (current) use of bisphosphonates: Secondary | ICD-10-CM | POA: Diagnosis not present

## 2018-06-20 DIAGNOSIS — Z79899 Other long term (current) drug therapy: Secondary | ICD-10-CM | POA: Diagnosis not present

## 2018-06-20 DIAGNOSIS — D126 Benign neoplasm of colon, unspecified: Secondary | ICD-10-CM | POA: Diagnosis not present

## 2018-06-20 DIAGNOSIS — E785 Hyperlipidemia, unspecified: Secondary | ICD-10-CM | POA: Diagnosis not present

## 2018-06-20 DIAGNOSIS — Z7951 Long term (current) use of inhaled steroids: Secondary | ICD-10-CM | POA: Insufficient documentation

## 2018-06-20 DIAGNOSIS — Z86010 Personal history of colonic polyps: Secondary | ICD-10-CM | POA: Insufficient documentation

## 2018-06-20 DIAGNOSIS — I10 Essential (primary) hypertension: Secondary | ICD-10-CM | POA: Insufficient documentation

## 2018-06-20 DIAGNOSIS — K635 Polyp of colon: Secondary | ICD-10-CM | POA: Diagnosis not present

## 2018-06-20 HISTORY — PX: COLONOSCOPY WITH PROPOFOL: SHX5780

## 2018-06-20 SURGERY — COLONOSCOPY WITH PROPOFOL
Anesthesia: General

## 2018-06-20 MED ORDER — PROPOFOL 10 MG/ML IV BOLUS
INTRAVENOUS | Status: DC | PRN
  Administered 2018-06-20: 100 mg via INTRAVENOUS
  Administered 2018-06-20: 40 mg via INTRAVENOUS

## 2018-06-20 MED ORDER — SODIUM CHLORIDE 0.9 % IV SOLN
INTRAVENOUS | Status: DC
  Administered 2018-06-20: 1000 mL via INTRAVENOUS

## 2018-06-20 MED ORDER — PROPOFOL 500 MG/50ML IV EMUL
INTRAVENOUS | Status: DC | PRN
  Administered 2018-06-20: 150 ug/kg/min via INTRAVENOUS

## 2018-06-20 NOTE — Anesthesia Preprocedure Evaluation (Signed)
Anesthesia Evaluation  Patient identified by MRN, date of birth, ID band Patient awake    Reviewed: Allergy & Precautions, H&P , NPO status , Patient's Chart, lab work & pertinent test results  Airway Mallampati: III  TM Distance: <3 FB Neck ROM: full    Dental  (+) Chipped, Poor Dentition, Missing, Partial Upper   Pulmonary neg pulmonary ROS, neg shortness of breath,           Cardiovascular Exercise Tolerance: Good hypertension, (-) angina(-) Past MI and (-) DOE      Neuro/Psych  Headaches, negative psych ROS   GI/Hepatic negative GI ROS, Neg liver ROS, neg GERD  ,  Endo/Other  negative endocrine ROS  Renal/GU negative Renal ROS  negative genitourinary   Musculoskeletal   Abdominal   Peds  Hematology negative hematology ROS (+)   Anesthesia Other Findings Past Medical History: No date: Breast discharge     Comment:  LEFT BREAST -- Milky and Clear No date: Diverticulosis No date: Heart murmur No date: Hyperlipidemia No date: Hypertension No date: Migraines  Past Surgical History: No date: ABDOMINAL HYSTERECTOMY     Comment:  dub 2008/2009 h/o abnormal pap  No date: BACK SURGERY     Comment:  cervical spine [place] NS No date: CESAREAN SECTION     Comment:  x2   BMI    Body Mass Index:  33.79 kg/m      Reproductive/Obstetrics negative OB ROS                             Anesthesia Physical  Anesthesia Plan  ASA: III  Anesthesia Plan: General   Post-op Pain Management:    Induction: Intravenous  PONV Risk Score and Plan: Propofol infusion and TIVA  Airway Management Planned: Natural Airway and Nasal Cannula  Additional Equipment:   Intra-op Plan:   Post-operative Plan:   Informed Consent: I have reviewed the patients History and Physical, chart, labs and discussed the procedure including the risks, benefits and alternatives for the proposed anesthesia with the  patient or authorized representative who has indicated his/her understanding and acceptance.   Dental Advisory Given  Plan Discussed with: Anesthesiologist, CRNA and Surgeon  Anesthesia Plan Comments: (Patient consented for risks of anesthesia including but not limited to:  - adverse reactions to medications - risk of intubation if required - damage to teeth, lips or other oral mucosa - sore throat or hoarseness - Damage to heart, brain, lungs or loss of life  Patient voiced understanding.)        Anesthesia Quick Evaluation

## 2018-06-20 NOTE — Anesthesia Post-op Follow-up Note (Signed)
Anesthesia QCDR form completed.        

## 2018-06-20 NOTE — Transfer of Care (Signed)
Immediate Anesthesia Transfer of Care Note  Patient: Joyce Simpson  Procedure(s) Performed: COLONOSCOPY WITH PROPOFOL (N/A )  Patient Location: Endoscopy Unit  Anesthesia Type:General  Level of Consciousness: awake  Airway & Oxygen Therapy: Patient Spontanous Breathing and Patient connected to nasal cannula oxygen  Post-op Assessment: Report given to RN and Post -op Vital signs reviewed and stable  Post vital signs: stable  Last Vitals:  Vitals Value Taken Time  BP    Temp    Pulse    Resp    SpO2      Last Pain:  Vitals:   06/20/18 0812  TempSrc: Tympanic  PainSc: 0-No pain         Complications: No apparent anesthesia complications and Patient re-intubated

## 2018-06-20 NOTE — H&P (Signed)
Jonathon Bellows, MD [address] [place], [place], [place], [address] [address], [place], [place], [place], [address] Phone: [phone]  Fax: [phone]  Primary Care Physician:  McLean-Scocuzza, Nino Glow, MD   Pre-Procedure History & Physical: HPI:  Joyce Simpson is a 57 y.o. female is here for an colonoscopy.   Past Medical History:  Diagnosis Date  . Breast discharge    LEFT BREAST -- Milky and Clear  . Diverticulosis   . Heart murmur   . Hyperlipidemia   . Hypertension   . Migraines     Past Surgical History:  Procedure Laterality Date  . ABDOMINAL HYSTERECTOMY     dub 2008/2009 h/o abnormal pap   . BACK SURGERY     cervical spine [place] NS  . CESAREAN SECTION     x2   . COLONOSCOPY WITH PROPOFOL N/A 04/11/2018   Procedure: COLONOSCOPY WITH PROPOFOL;  Surgeon: Jonathon Bellows, MD;  Location: [facility] ENDOSCOPY;  Service: Gastroenterology;  Laterality: N/A;    Prior to Admission medications   Medication Sig Start Date End Date Taking? Authorizing Provider  alendronate (FOSAMAX) 70 MG tablet Take 1 tablet (70 mg total) by mouth once a week. Take with a full glass of water on an empty stomach. 02/28/18  Yes McLean-Scocuzza, Nino Glow, MD  amLODipine (NORVASC) 5 MG tablet Take 1 tablet (5 mg total) by mouth daily. 06/17/18  Yes McLean-Scocuzza, Nino Glow, MD  benazepril (LOTENSIN) 40 MG tablet Take 1 tablet (40 mg total) by mouth daily. 06/17/18  Yes McLean-Scocuzza, Nino Glow, MD  Cholecalciferol 50000 units capsule Take 1 capsule (50,000 Units total) by mouth once a week. 03/26/18  Yes McLean-Scocuzza, Nino Glow, MD  fluticasone (FLONASE) 50 MCG/ACT nasal spray Place 2 sprays into both nostrils daily. 03/29/17  Yes Rochel Brome A, MD  hydrochlorothiazide (HYDRODIURIL) 12.5 MG tablet Take 1 tablet (12.5 mg total) by mouth daily. 02/28/18  Yes McLean-Scocuzza, Nino Glow, MD  scopolamine (TRANSDERM SCOP, 1.5 MG,) 1 MG/3DAYS Place 1 patch (1.5 mg total) onto the skin every 3 (three)  days. 06/19/18  Yes McLean-Scocuzza, Nino Glow, MD    Allergies as of 04/21/2018  . (No Known Allergies)    Family History  Problem Relation Age of Onset  . Hypertension Mother   . AAA (abdominal aortic aneurysm) Mother   . Aneurysm Mother   . Cancer Brother        Colon  . Hypertension Brother   . Diabetes Daughter        type 1   . Asthma Sister   . Cancer Brother        colon cancer   . AAA (abdominal aortic aneurysm) Brother   . Anuerysm Brother   . Cancer Maternal Aunt        Breast  . Breast cancer Maternal Aunt   . Cancer Paternal Aunt        Breast  . Breast cancer Paternal Aunt     Social History   Socioeconomic History  . Marital status: Married    Spouse name: Not on file  . Number of children: Not on file  . Years of education: Not on file  . Highest education level: Not on file  Occupational History  . Not on file  Social Needs  . Financial resource strain: Not on file  . Food insecurity:    Worry: Not on file    Inability: Not on file  . Transportation needs:    Medical: Not  on file    Non-medical: Not on file  Tobacco Use  . Smoking status: Never Smoker  . Smokeless tobacco: Never Used  Substance and Sexual Activity  . Alcohol use: No  . Drug use: No  . Sexual activity: Not on file  Lifestyle  . Physical activity:    Days per week: Not on file    Minutes per session: Not on file  . Stress: Not on file  Relationships  . Social connections:    Talks on phone: Not on file    Gets together: Not on file    Attends religious service: Not on file    Active member of club or organization: Not on file    Attends meetings of clubs or organizations: Not on file    Relationship status: Not on file  . Intimate partner violence:    Fear of current or ex partner: Not on file    Emotionally abused: Not on file    Physically abused: Not on file    Forced sexual activity: Not on file  Other Topics Concern  . 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 Neck:  Supple; no masses or thyromegaly. Lungs:  Clear throughout to auscultation, normal respiratory effort.    Heart:  +S1, +S2, Regular rate and rhythm, No edema. Abdomen:  Soft, nontender and nondistended. Normal bowel sounds, without guarding, and without rebound.   Neurologic:  Alert and  oriented x4;  grossly normal neurologically.  Impression/Plan: Joyce Simpson is here for an colonoscopy to be performed for surveillance due to prior history of colon polyps   Risks, benefits, limitations, and alternatives regarding  colonoscopy have been reviewed with the patient.  Questions have been answered.  All parties agreeable.   Jonathon Bellows, MD  06/20/2018, 8:42 AM

## 2018-06-20 NOTE — Anesthesia Postprocedure Evaluation (Signed)
Anesthesia Post Note  Patient: Joyce Simpson  Procedure(s) Performed: COLONOSCOPY WITH PROPOFOL (N/A )  Patient location during evaluation: Endoscopy Anesthesia Type: General Level of consciousness: awake and alert Pain management: pain level controlled Vital Signs Assessment: post-procedure vital signs reviewed and stable Respiratory status: spontaneous breathing, nonlabored ventilation, respiratory function stable and patient connected to nasal cannula oxygen Cardiovascular status: blood pressure returned to baseline and stable Postop Assessment: no apparent nausea or vomiting Anesthetic complications: no     Last Vitals:  Vitals:   06/20/18 0933 06/20/18 0943  BP: 131/82 123/82  Pulse: 63 73  Resp: (!) 9 18  Temp:    SpO2: 100% 100%    Last Pain:  Vitals:   06/20/18 0943  TempSrc:   PainSc: 0-No pain                 Precious Haws Piscitello

## 2018-06-20 NOTE — Op Note (Signed)
[facility] Gastroenterology Patient Name: Joyce Simpson Procedure Date: 06/20/2018 8:46 AM MRN: [id number] Account #: [id number] Date of Birth: 05-11-1961 Admit Type: Outpatient Age: 57 Room: [facility] ENDO ROOM 4 Gender: Female Note Status: Finalized Procedure:            Colonoscopy Indications:          High risk colon cancer surveillance: Personal history                        of colonic polyps Providers:            Jonathon Bellows MD, MD Referring MD:         Nino Glow Mclean-Scocuzza MD, MD (Referring MD) Medicines:            Monitored Anesthesia Care Complications:        No immediate complications. Procedure:            Pre-Anesthesia Assessment:                       - Prior to the procedure, a History and Physical was                        performed, and patient medications, allergies and                        sensitivities were reviewed. The patient's tolerance of                        previous anesthesia was reviewed.                       - The risks and benefits of the procedure and the                        sedation options and risks were discussed with the                        patient. All questions were answered and informed                        consent was obtained.                       - ASA Grade Assessment: II - A patient with mild                        systemic disease.                       After obtaining informed consent, the colonoscope was                        passed under direct vision. Throughout the procedure,                        the patient's blood pressure, pulse, and oxygen                        saturations were monitored continuously. The  Colonoscope was introduced through the anus and                        advanced to the the cecum, identified by the                        appendiceal orifice, IC valve and transillumination.                        The colonoscopy was performed with ease. The  patient                        tolerated the procedure well. The quality of the bowel                        preparation was excellent. Findings:      The perianal and digital rectal examinations were normal.      A 6 mm polyp was found in the transverse colon. The polyp was sessile.       The polyp was removed with a cold snare. Resection and retrieval were       complete.      The exam was otherwise without abnormality on direct and retroflexion       views. Impression:           - One 6 mm polyp in the transverse colon, removed with                        a cold snare. Resected and retrieved.                       - The examination was otherwise normal on direct and                        retroflexion views. Recommendation:       - Discharge patient to home (with escort).                       - Resume previous diet.                       - Continue present medications.                       - Await pathology results.                       - Repeat colonoscopy in 3 years for surveillance. Procedure Code(s):    --- Professional ---                       [id number], Colonoscopy, flexible; with removal of tumor(s),                        polyp(s), or other lesion(s) by snare technique Diagnosis Code(s):    --- Professional ---                       Z86.010, Personal history of colonic polyps                       D12.3, Benign neoplasm of transverse colon (hepatic  flexure or splenic flexure) CPT copyright 2017 American Medical Association. All rights reserved. The codes documented in this report are preliminary and upon coder review may  be revised to meet current compliance requirements. Jonathon Bellows, MD Jonathon Bellows MD, MD 06/20/2018 9:11:58 AM This report has been signed electronically. Number of Addenda: 0 Note Initiated On: 06/20/2018 8:46 AM Scope Withdrawal Time: 0 hours 17 minutes 11 seconds  Total Procedure Duration: 0 hours 20 minutes 18 seconds        [facility]

## 2018-06-23 LAB — SURGICAL PATHOLOGY

## 2018-06-26 NOTE — Telephone Encounter (Signed)
patient has been notified.

## 2018-07-04 ENCOUNTER — Encounter: Payer: Self-pay | Admitting: Gastroenterology

## 2018-07-28 ENCOUNTER — Encounter: Payer: Self-pay | Admitting: Internal Medicine

## 2018-07-28 ENCOUNTER — Ambulatory Visit: Payer: 59 | Admitting: Internal Medicine

## 2018-07-28 VITALS — BP 134/76 | HR 61 | Temp 98.4°F | Ht 60.0 in | Wt 178.2 lb

## 2018-07-28 DIAGNOSIS — M810 Age-related osteoporosis without current pathological fracture: Secondary | ICD-10-CM | POA: Diagnosis not present

## 2018-07-28 DIAGNOSIS — R1084 Generalized abdominal pain: Secondary | ICD-10-CM

## 2018-07-28 DIAGNOSIS — R232 Flushing: Secondary | ICD-10-CM

## 2018-07-28 DIAGNOSIS — Z23 Encounter for immunization: Secondary | ICD-10-CM | POA: Diagnosis not present

## 2018-07-28 DIAGNOSIS — I10 Essential (primary) hypertension: Secondary | ICD-10-CM | POA: Diagnosis not present

## 2018-07-28 DIAGNOSIS — Z1329 Encounter for screening for other suspected endocrine disorder: Secondary | ICD-10-CM | POA: Diagnosis not present

## 2018-07-28 DIAGNOSIS — E785 Hyperlipidemia, unspecified: Secondary | ICD-10-CM

## 2018-07-28 DIAGNOSIS — R319 Hematuria, unspecified: Secondary | ICD-10-CM | POA: Diagnosis not present

## 2018-07-28 DIAGNOSIS — Z1159 Encounter for screening for other viral diseases: Secondary | ICD-10-CM

## 2018-07-28 DIAGNOSIS — E559 Vitamin D deficiency, unspecified: Secondary | ICD-10-CM

## 2018-07-28 DIAGNOSIS — Z0184 Encounter for antibody response examination: Secondary | ICD-10-CM

## 2018-07-28 MED ORDER — BENAZEPRIL HCL 40 MG PO TABS: 40.0000 mg | ORAL_TABLET | Freq: Every day | ORAL | 3 refills | Status: DC

## 2018-07-28 MED ORDER — AMLODIPINE BESYLATE 5 MG PO TABS: 5.0000 mg | ORAL_TABLET | Freq: Every day | ORAL | 3 refills | Status: DC

## 2018-07-28 MED ORDER — HYDROCHLOROTHIAZIDE 12.5 MG PO TABS: 12.5000 mg | ORAL_TABLET | Freq: Every day | ORAL | 3 refills | Status: DC

## 2018-07-28 NOTE — Progress Notes (Signed)
Chief Complaint  Patient presents with  . Follow-up    92mo   F/u  1. HTN controlled on norvasc 5 mg qd, hctz 12.5 mg qd 2. C/o hot flashes post hysterectomy and has to use fan at night. No abnormal weight loss of fever  3. C/o abdominal pain with taking fosamax qd to stopped 05/2018. She also c/o diverticulosis history and abdominal pain intermittently with certain foods colonoscopy had 06/20/18 polyp tubular and f/u in 3 years with Dr. Vicente Males   Review of Systems  Constitutional: Negative for weight loss.  HENT: Negative for hearing loss.   Eyes: Negative for blurred vision.  Respiratory: Negative for shortness of breath.   Cardiovascular: Negative for chest pain.  Gastrointestinal: Positive for abdominal pain.  Genitourinary: Negative for hematuria.  Musculoskeletal: Negative for falls.  Skin: Negative for rash.  Neurological: Negative for headaches.  Psychiatric/Behavioral: Negative for depression.   Past Medical History:  Diagnosis Date  . Breast discharge    LEFT BREAST -- Milky and Clear  . Diverticulosis   . Heart murmur   . Hyperlipidemia   . Hypertension   . Migraines    Past Surgical History:  Procedure Laterality Date  . ABDOMINAL HYSTERECTOMY     dub 2008/2009 h/o abnormal pap   . BACK SURGERY     cervical spine [place] NS  . CESAREAN SECTION     x2   . COLONOSCOPY WITH PROPOFOL N/A 04/11/2018   Procedure: COLONOSCOPY WITH PROPOFOL;  Surgeon: Jonathon Bellows, MD;  Location: [facility] ENDOSCOPY;  Service: Gastroenterology;  Laterality: N/A;  . COLONOSCOPY WITH PROPOFOL N/A 06/20/2018   Procedure: COLONOSCOPY WITH PROPOFOL;  Surgeon: Jonathon Bellows, MD;  Location: [facility] ENDOSCOPY;  Service: Gastroenterology;  Laterality: N/A;   Family History  Problem Relation Age of Onset  . Hypertension Mother   . AAA (abdominal aortic aneurysm) Mother   . Aneurysm Mother   . Cancer Brother        Colon  . Hypertension Brother   . Diabetes Daughter        type 1   . Asthma Sister   .  Cancer Brother        colon cancer   . AAA (abdominal aortic aneurysm) Brother   . Anuerysm Brother   . Cancer Maternal Aunt        Breast  . Breast cancer Maternal Aunt   . Cancer Paternal Aunt        Breast  . Breast cancer Paternal Aunt    Social History   Socioeconomic History  . Marital status: Married    Spouse name: Not on file  . Number of children: Not on file  . Years of education: Not on file  . Highest education level: Not on file  Occupational History  . Not on file  Social Needs  . Financial resource strain: Not on file  . Food insecurity:    Worry: Not on file    Inability: Not on file  . Transportation needs:    Medical: Not on file    Non-medical: Not on file  Tobacco Use  . Smoking status: Never Smoker  . Smokeless tobacco: Never Used  Substance and Sexual Activity  . Alcohol use: No  . Drug use: No  . Sexual activity: Not on file  Lifestyle  . Physical activity:    Days per week: Not on file    Minutes per session: Not on file  . Stress: Not on file  Relationships  .  Social connections:    Talks on phone: Not on file    Gets together: Not on file    Attends religious service: Not on file    Active member of club or organization: Not on file    Attends meetings of clubs or organizations: Not on file    Relationship status: Not on file  . Intimate partner violence:    Fear of current or ex partner: Not on file    Emotionally abused: Not on file    Physically abused: Not on file    Forced sexual activity: Not on file  Other Topics Concern  . 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 amLODipine (NORVASC) 5 MG tablet Take 1 tablet (5 mg total) by mouth daily.  . benazepril (LOTENSIN) 40 MG tablet Take 1 tablet (40 mg total) by mouth daily.  . Cholecalciferol 50000 units capsule Take  1 capsule (50,000 Units total) by mouth once a week.  . fluticasone (FLONASE) 50 MCG/ACT nasal spray Place 2 sprays into both nostrils daily.  . hydrochlorothiazide (HYDRODIURIL) 12.5 MG tablet Take 1 tablet (12.5 mg total) by mouth daily.  Marland Kitchen scopolamine (TRANSDERM SCOP, 1.5 MG,) 1 MG/3DAYS Place 1 patch (1.5 mg total) onto the skin every 3 (three) days.   No Known Allergies Recent Results (from the past 2160 hour(s))  Surgical pathology     Status: None   Collection Time: 06/20/18  8:59 AM  Result Value Ref Range   SURGICAL PATHOLOGY      Surgical Pathology CASE: [id number] PATIENT: Joyce Simpson Surgical Pathology Report     SPECIMEN SUBMITTED: A. Colon polyp, transverse;cold snare  CLINICAL HISTORY: None provided  PRE-OPERATIVE DIAGNOSIS: HX of polyps, Z86.010  POST-OPERATIVE DIAGNOSIS: Colon polyp     DIAGNOSIS: A. COLON POLYP, TRANSVERSE; COLD SNARE: - TUBULAR ADENOMA. - NEGATIVE FOR HIGH-GRADE DYSPLASIA AND MALIGNANCY.   GROSS DESCRIPTION: A. Labeled: Cold snare transverse colon polyp Received: In formalin Tissue fragment(s): Multiple Size: Aggregate, 0.3 x 0.2 x 0.1 cm Description: Pink fragment and yellow fecal material Entirely submitted in one cassette.    Final Diagnosis performed by Quay Burow, MD.   Electronically signed 06/23/2018 4:37:00PM The electronic signature indicates that the named Attending Pathologist has evaluated the specimen  Technical component performed at [facility], [address] [place], [place] [address] Lab: [phone] Dir: Rush Farmer, MD , MMM  Professional component performed at [facility], [facility], [place], [place],  [address] Lab: [phone] Dir: Dellia Nims. Rubinas, MD    Objective  Body mass index is 34.81 kg/m. Wt Readings from Last 3 Encounters:  07/28/18 178 lb 4 oz (80.9 kg)  06/20/18 163 lb (73.9 kg)  04/11/18 173 lb (78.5 kg)   Temp Readings from Last 3  Encounters:  07/28/18 98.4 F (36.9 C) (Oral)  06/20/18 (!) 96.9 F (36.1 C) (Tympanic)  04/11/18 (!) 97 F (36.1 C) (Tympanic)   BP Readings from Last 3 Encounters:  07/28/18 134/76  06/20/18 123/82  04/11/18 (!) 148/87   Pulse Readings from Last 3 Encounters:  07/28/18 61  06/20/18 73  04/11/18 61    Physical Exam  Constitutional: She is oriented to person, place, and time. Vital signs are normal. She appears well-developed and well-nourished. She is cooperative.  HENT:  Head: Normocephalic and atraumatic.  Mouth/Throat: Oropharynx is clear and moist and mucous membranes are normal.  Eyes: Pupils are equal, round, and reactive to light. Conjunctivae are normal.  Cardiovascular: Normal rate, regular rhythm and normal heart sounds.  Pulmonary/Chest: Effort normal and breath sounds normal.  Neurological: She is alert and oriented to person, place, and time. Gait normal.  Skin: Skin is warm, dry and intact.  Psychiatric: She has a normal mood and affect. Her speech is normal and behavior is normal. Judgment and thought content normal. Cognition and memory are normal.  Nursing note and vitals reviewed.   Assessment   1. HTN/HLD  2. Abdominal pain ? Related fosamax dose, h/o diverticulosis  3. Hot flashes post hysterectomy  4. HM Plan   1.  Refilled meds norvasc 5 mg qd, benazepril 40 mg qd, hctz 12.5  Given info cholesterol  Check fasting labs 09/21/18  2.  Given diverticulitis diet and fodmap diet  Consider bentyl prn in future  3.  Will do non pharmacologic tx for now  4.  Never had flu shot  Tdap today  Hep c neg  Hep B vaccine 2/3 doses sch 3rd dose 09/30/18   mammo neg 06/19/18   DEXA 06/17/17 +osteoporosis due again 03/04/2019 after 1 year of fosamax today pt mentioned unable to tolerate but will try medication with eating crackers >30 minutes after instead of waiting 2 hours to eat  -disc with pt strontium 500 mg qd vs taking prolia injections she wants to  think about it  05/01/17 pap neg neg HPV s/p hysterectomy and h/o abnormal pap sees Dr. [facility]   [facility] EGD and colonoscopy had <50 and >5 years need to get records.  -With [place] 2 brothers colon cancer refer to Dr. Cherlyn Labella has appt colonoscopy 04/11/18 Dr. Audria Nine MDs  -colonoscopy had 04/11/18 polyp and repeat 06/20/18 polpy +tubular adenoma given size [facility] rec repeat in 3 years       Provider: Dr. Olivia Mackie McLean-Scocuzza-Internal Medicine

## 2018-07-28 NOTE — Patient Instructions (Addendum)
Strontium 500 mg daily bone supplement or Prolia injection let me know vs continue Fosamax  sch fasting labs 09/21/18 f/u with in 10/2018 early  Prolia/Denosumab injection What is this medicine? DENOSUMAB (den oh sue mab) slows bone breakdown. Prolia is used to treat osteoporosis in women after menopause and in men. Delton See is used to treat a high calcium level due to cancer and to prevent bone fractures and other bone problems caused by multiple myeloma or cancer bone metastases. Delton See is also used to treat giant cell tumor of the bone. This medicine may be used for other purposes; ask your health care provider or pharmacist if you have questions. COMMON BRAND NAME(S): Prolia, XGEVA What should I tell my health care provider before I take this medicine? They need to know if you have any of these conditions: -dental disease -having surgery or tooth extraction -infection -kidney disease -low levels of calcium or Vitamin D in the blood -malnutrition -on hemodialysis -skin conditions or sensitivity -thyroid or parathyroid disease -an unusual reaction to denosumab, other medicines, foods, dyes, or preservatives -pregnant or trying to get pregnant -breast-feeding How should I use this medicine? This medicine is for injection under the skin. It is given by a health care professional in a hospital or clinic setting. If you are getting Prolia, a special MedGuide will be given to you by the pharmacist with each prescription and refill. Be sure to read this information carefully each time. For Prolia, talk to your pediatrician regarding the use of this medicine in children. Special care may be needed. For Delton See, talk to your pediatrician regarding the use of this medicine in children. While this drug may be prescribed for children as young as 13 years for selected conditions, precautions do apply. Overdosage: If you think you have taken too much of this medicine contact a poison control center or  emergency room at once. NOTE: This medicine is only for you. Do not share this medicine with others. What if I miss a dose? It is important not to miss your dose. Call your doctor or health care professional if you are unable to keep an appointment. What may interact with this medicine? Do not take this medicine with any of the following medications: -other medicines containing denosumab This medicine may also interact with the following medications: -medicines that lower your chance of fighting infection -steroid medicines like prednisone or cortisone This list may not describe all possible interactions. Give your health care provider a list of all the medicines, herbs, non-prescription drugs, or dietary supplements you use. Also tell them if you smoke, drink alcohol, or use illegal drugs. Some items may interact with your medicine. What should I watch for while using this medicine? Visit your doctor or health care professional for regular checks on your progress. Your doctor or health care professional may order blood tests and other tests to see how you are doing. Call your doctor or health care professional for advice if you get a fever, chills or sore throat, or other symptoms of a cold or flu. Do not treat yourself. This drug may decrease your body's ability to fight infection. Try to avoid being around people who are sick. You should make sure you get enough calcium and vitamin D while you are taking this medicine, unless your doctor tells you not to. Discuss the foods you eat and the vitamins you take with your health care professional. See your dentist regularly. Brush and floss your teeth as directed. Before you have  any dental work done, tell your dentist you are receiving this medicine. Do not become pregnant while taking this medicine or for 5 months after stopping it. Talk with your doctor or health care professional about your birth control options while taking this medicine. Women  should inform their doctor if they wish to become pregnant or think they might be pregnant. There is a potential for serious side effects to an unborn child. Talk to your health care professional or pharmacist for more information. What side effects may I notice from receiving this medicine? Side effects that you should report to your doctor or health care professional as soon as possible: -allergic reactions like skin rash, itching or hives, swelling of the face, lips, or tongue -bone pain -breathing problems -dizziness -jaw pain, especially after dental work -redness, blistering, peeling of the skin -signs and symptoms of infection like fever or chills; cough; sore throat; pain or trouble passing urine -signs of low calcium like fast heartbeat, muscle cramps or muscle pain; pain, tingling, numbness in the hands or feet; seizures -unusual bleeding or bruising -unusually weak or tired Side effects that usually do not require medical attention (report to your doctor or health care professional if they continue or are bothersome): -constipation -diarrhea -headache -joint pain -loss of appetite -muscle pain -runny nose -tiredness -upset stomach This list may not describe all possible side effects. Call your doctor for medical advice about side effects. You may report side effects to FDA at 1-800-FDA-1088. Where should I keep my medicine? This medicine is only given in a clinic, doctor's office, or other health care setting and will not be stored at home. NOTE: This sheet is a summary. It may not cover all possible information. If you have questions about this medicine, talk to your doctor, pharmacist, or health care provider.  2018 Elsevier/Gold Standard (2017-01-08 19:17:21)   Osteoporosis Osteoporosis is the thinning and loss of density in the bones. Osteoporosis makes the bones more brittle, fragile, and likely to break (fracture). Over time, osteoporosis can cause the bones to become  so weak that they fracture after a simple fall. The bones most likely to fracture are the bones in the hip, wrist, and spine. What are the causes? The exact cause is not known. What increases the risk? Anyone can develop osteoporosis. You may be at greater risk if you have a family history of the condition or have poor nutrition. You may also have a higher risk if you are:  Female.  40 years old or older.  A smoker.  Not physically active.  White or Asian.  Slender.  What are the signs or symptoms? A fracture might be the first sign of the disease, especially if it results from a fall or injury that would not usually cause a bone to break. Other signs and symptoms include:  Low back and neck pain.  Stooped posture.  Height loss.  How is this diagnosed? To make a diagnosis, your health care provider may:  Take a medical history.  Perform a physical exam.  Order tests, such as: ? A bone mineral density test. ? A dual-energy X-ray absorptiometry test.  How is this treated? The goal of osteoporosis treatment is to strengthen your bones to reduce your risk of a fracture. Treatment may involve:  Making lifestyle changes, such as: ? Eating a diet rich in calcium. ? Doing weight-bearing and muscle-strengthening exercises. ? Stopping tobacco use. ? Limiting alcohol intake.  Taking medicine to slow the process of bone  loss or to increase bone density.  Monitoring your levels of calcium and vitamin D.  Follow these instructions at home:  Include calcium and vitamin D in your diet. Calcium is important for bone health, and vitamin D helps the body absorb calcium.  Perform weight-bearing and muscle-strengthening exercises as directed by your health care provider.  Do not use any tobacco products, including cigarettes, chewing tobacco, and electronic cigarettes. If you need help quitting, ask your health care provider.  Limit your alcohol intake.  Take medicines only as  directed by your health care provider.  Keep all follow-up visits as directed by your health care provider. This is important.  Take precautions at home to lower your risk of falling, such as: ? Keeping rooms well lit and clutter free. ? Installing safety rails on stairs. ? Using rubber mats in the bathroom and other areas that are often wet or slippery. Get help right away if: You fall or injure yourself. This information is not intended to replace advice given to you by your health care provider. Make sure you discuss any questions you have with your health care provider. Document Released: 09/26/2005 Document Revised: 05/21/2016 Document Reviewed: 05/27/2014 Elsevier Interactive Patient Education  2018 [place] Vaccine (Diphtheria, Tetanus, and Pertussis): What You Need to Know 1. Why get vaccinated? Diphtheria, tetanus, and pertussis are serious diseases caused by bacteria. Diphtheria and pertussis are spread from person to person. Tetanus enters the body through cuts or wounds. DIPHTHERIA causes a thick covering in the back of the throat.  It can lead to breathing problems, paralysis, heart failure, and even death.  TETANUS (Lockjaw) causes painful tightening of the muscles, usually all over the body.  It can lead to "locking" of the jaw so the victim cannot open his mouth or swallow. Tetanus leads to death in up to 2 out of 10 cases.  PERTUSSIS (Whooping Cough) causes coughing spells so bad that it is hard for infants to eat, drink, or breathe. These spells can last for weeks.  It can lead to pneumonia, seizures (jerking and staring spells), brain damage, and death.  Diphtheria, tetanus, and pertussis vaccine (DTaP) can help prevent these diseases. Most children who are vaccinated with DTaP will be protected throughout childhood. Many more children would get these diseases if we stopped vaccinating. DTaP is a safer version of an older vaccine called DTP. DTP is  no longer used in the [place]. 2. Who should get DTaP vaccine and when? Children should get 5 doses of DTaP vaccine, one dose at each of the following ages:  2 months  4 months  6 months  15-18 months  4-6 years  DTaP may be given at the same time as other vaccines. 3. Some children should not get DTaP vaccine or should wait  Children with minor illnesses, such as a cold, may be vaccinated. But children who are moderately or severely ill should usually wait until they recover before getting DTaP vaccine.  Any child who had a life-threatening allergic reaction after a dose of DTaP should not get another dose.  Any child who suffered a brain or nervous system disease within 7 days after a dose of DTaP should not get another dose.  Talk with your doctor if your child: ? had a seizure or collapsed after a dose of DTaP, ? cried non-stop for 3 hours or more after a dose of DTaP, ? had a fever over 105F after a dose of DTaP. Ask  your doctor for more information. Some of these children should not get another dose of pertussis vaccine, but may get a vaccine without pertussis, called DT. 4. Older children and adults DTaP is not licensed for adolescents, adults, or children 41 years of age and older. But older people still need protection. A vaccine called Tdap is similar to DTaP. A single dose of Tdap is recommended for people 11 through 57 years of age. Another vaccine, called Td, protects against tetanus and diphtheria, but not pertussis. It is recommended every 10 years. There are separate Vaccine Information Statements for these vaccines. 5. What are the risks from DTaP vaccine? Getting diphtheria, tetanus, or pertussis disease is much riskier than getting DTaP vaccine. However, a vaccine, like any medicine, is capable of causing serious problems, such as severe allergic reactions. The risk of DTaP vaccine causing serious harm, or death, is extremely small. Mild problems  (common)  Fever (up to about 1 child in 4)  Redness or swelling where the shot was given (up to about 1 child in 4)  Soreness or tenderness where the shot was given (up to about 1 child in 4) These problems occur more often after the 4th and 5th doses of the DTaP series than after earlier doses. Sometimes the 4th or 5th dose of DTaP vaccine is followed by swelling of the entire arm or leg in which the shot was given, lasting 1-7 days (up to about 1 child in 60). Other mild problems include:  Fussiness (up to about 1 child in 3)  Tiredness or poor appetite (up to about 1 child in 10)  Vomiting (up to about 1 child in 31) These problems generally occur 1-3 days after the shot. Moderate problems (uncommon)  Seizure (jerking or staring) (about 1 child out of 14,000)  Non-stop crying, for 3 hours or more (up to about 1 child out of 1,000)  High fever, over 105F (about 1 child out of 16,000) Severe problems (very rare)  Serious allergic reaction (less than 1 out of a million doses)  Several other severe problems have been reported after DTaP vaccine. These include: ? Long-term seizures, coma, or lowered consciousness ? Permanent brain damage. These are so rare it is hard to tell if they are caused by the vaccine. Controlling fever is especially important for children who have had seizures, for any reason. It is also important if another family member has had seizures. You can reduce fever and pain by giving your child an aspirin-free pain reliever when the shot is given, and for the next 24 hours, following the package instructions. 6. What if there is a serious reaction? What should I look for? Look for anything that concerns you, such as signs of a severe allergic reaction, very high fever, or behavior changes. Signs of a severe allergic reaction can include hives, swelling of the face and throat, difficulty breathing, a fast heartbeat, dizziness, and weakness. These would start a few  minutes to a few hours after the vaccination. What should I do?  If you think it is a severe allergic reaction or other emergency that can't wait, call 9-1-1 or get the person to the nearest hospital. Otherwise, call your doctor.  Afterward, the reaction should be reported to the Vaccine Adverse Event Reporting System (VAERS). Your doctor might file this report, or you can do it yourself through the VAERS web site at www.vaers.SamedayNews.es, or by calling 914-501-3368. ? VAERS is only for reporting reactions. They do not give medical  advice. 7. The National Vaccine Injury Compensation Program The [employer] Vaccine Injury Compensation Program (VICP) is a federal program that was created to compensate people who may have been injured by certain vaccines. Persons who believe they may have been injured by a vaccine can learn about the program and about filing a claim by calling 646-761-6990 or visiting the [employer] website at GoldCloset.com.ee. 8. How can I learn more?  Ask your doctor.  Call your local or state health department.  Contact the Centers for Disease Control and Prevention (CDC): ? Call (765)819-3787 (1-800-CDC-INFO) or ? Visit CDC's website at http://hunter.com/ CDC DTaP Vaccine (Diphtheria, Tetanus, and Pertussis) VIS (05/16/06) This information is not intended to replace advice given to you by your health care provider. Make sure you discuss any questions you have with your health care provider. Document Released: 10/14/2006 Document Revised: 09/06/2016 Document Reviewed: 09/06/2016 Elsevier Interactive Patient Education  2017 Elsevier Inc.    Diverticulitis Diverticulitis is infection or inflammation of small pouches (diverticula) in the colon that form due to a condition called diverticulosis. Diverticula can trap stool (feces) and bacteria, causing infection and inflammation. Diverticulitis may cause severe stomach pain and diarrhea. It may lead to tissue damage  in the colon that causes bleeding. The diverticula may also burst (rupture) and cause infected stool to enter other areas of the abdomen. Complications of diverticulitis can include:  Bleeding.  Severe infection.  Severe pain.  Rupture (perforation) of the colon.  Blockage (obstruction) of the colon.  What are the causes? This condition is caused by stool becoming trapped in the diverticula, which allows bacteria to grow in the diverticula. This leads to inflammation and infection. What increases the risk? You are more likely to develop this condition if:  You have diverticulosis. The risk for diverticulosis increases if: ? You are overweight or obese. ? You use tobacco products. ? You do not get enough exercise.  You eat a diet that does not include enough fiber. High-fiber foods include fruits, vegetables, beans, nuts, and whole grains.  What are the signs or symptoms? Symptoms of this condition may include:  Pain and tenderness in the abdomen. The pain is normally located on the left side of the abdomen, but it may occur in other areas.  Fever and chills.  Bloating.  Cramping.  Nausea.  Vomiting.  Changes in bowel routines.  Blood in your stool.  How is this diagnosed? This condition is diagnosed based on:  Your medical history.  A physical exam.  Tests to make sure there is nothing else causing your condition. These tests may include: ? Blood tests. ? Urine tests. ? Imaging tests of the abdomen, including X-rays, ultrasounds, MRIs, or CT scans.  How is this treated? Most cases of this condition are mild and can be treated at home. Treatment may include:  Taking over-the-counter pain medicines.  Following a clear liquid diet.  Taking antibiotic medicines by mouth.  Rest.  More severe cases may need to be treated at a hospital. Treatment may include:  Not eating or drinking.  Taking prescription pain medicine.  Receiving antibiotic medicines  through an IV tube.  Receiving fluids and nutrition through an IV tube.  Surgery.  When your condition is under control, your health care provider may recommend that you have a colonoscopy. This is an exam to look at the entire large intestine. During the exam, a lubricated, bendable tube is inserted into the anus and then passed into the rectum, colon, and other parts of  the large intestine. A colonoscopy can show how severe your diverticula are and whether something else may be causing your symptoms. Follow these instructions at home: Medicines  Take over-the-counter and prescription medicines only as told by your health care provider. These include fiber supplements, probiotics, and stool softeners.  If you were prescribed an antibiotic medicine, take it as told by your health care provider. Do not stop taking the antibiotic even if you start to feel better.  Do not drive or use heavy machinery while taking prescription pain medicine. General instructions  Follow a full liquid diet or another diet as directed by your health care provider. After your symptoms improve, your health care provider may tell you to change your diet. He or she may recommend that you eat a diet that contains at least 25 g (25 grams) of fiber daily. Fiber makes it easier to pass stool. Healthy sources of fiber include: ? Berries. One cup contains 4-8 grams of fiber. ? Beans or lentils. One half cup contains 5-8 grams of fiber. ? Green vegetables. One cup contains 4 grams of fiber.  Exercise for at least 30 minutes, 3 times each week. You should exercise hard enough to raise your heart rate and break a sweat.  Keep all follow-up visits as told by your health care provider. This is important. You may need a colonoscopy. Contact a health care provider if:  Your pain does not improve.  You have a hard time drinking or eating food.  Your bowel movements do not return to normal. Get help right away if:  Your  pain gets worse.  Your symptoms do not get better with treatment.  Your symptoms suddenly get worse.  You have a fever.  You vomit more than one time.  You have stools that are bloody, black, or tarry. Summary  Diverticulitis is infection or inflammation of small pouches (diverticula) in the colon that form due to a condition called diverticulosis. Diverticula can trap stool (feces) and bacteria, causing infection and inflammation.  You are at higher risk for this condition if you have diverticulosis and you eat a diet that does not include enough fiber.  Most cases of this condition are mild and can be treated at home. More severe cases may need to be treated at a hospital.  When your condition is under control, your health care provider may recommend that you have an exam called a colonoscopy. This exam can show how severe your diverticula are and whether something else may be causing your symptoms. This information is not intended to replace advice given to you by your health care provider. Make sure you discuss any questions you have with your health care provider. Document Released: 09/26/2005 Document Revised: 01/19/2017 Document Reviewed: 01/19/2017 Elsevier Interactive Patient Education  2018 [employer].  Menopause Menopause is the normal time of life when menstrual periods stop completely. Menopause is complete when you have missed 12 consecutive menstrual periods. It usually occurs between the ages of 54 years and 35 years. Very rarely does a woman develop menopause before the age of 109 years. At menopause, your ovaries stop producing the female hormones estrogen and progesterone. This can cause undesirable symptoms and also affect your health. Sometimes the symptoms may occur 4-5 years before the menopause begins. There is no relationship between menopause and:  Oral contraceptives.  Number of children you had.  Race.  The age your menstrual periods started  (menarche).  Heavy smokers and very thin women may develop menopause  earlier in life. What are the causes?  The ovaries stop producing the female hormones estrogen and progesterone. Other causes include:  Surgery to remove both ovaries.  The ovaries stop functioning for no known reason.  Tumors of the pituitary gland in the brain.  Medical disease that affects the ovaries and hormone production.  Radiation treatment to the abdomen or pelvis.  Chemotherapy that affects the ovaries.  What are the signs or symptoms?  Hot flashes.  Night sweats.  Decrease in sex drive.  Vaginal dryness and thinning of the vagina causing painful intercourse.  Dryness of the skin and developing wrinkles.  Headaches.  Tiredness.  Irritability.  Memory problems.  Weight gain.  Bladder infections.  Hair growth of the face and chest.  Infertility. More serious symptoms include:  Loss of bone (osteoporosis) causing breaks (fractures).  Depression.  Hardening and narrowing of the arteries (atherosclerosis) causing heart attacks and strokes.  How is this diagnosed?  When the menstrual periods have stopped for 12 straight months.  Physical exam.  Hormone studies of the blood. How is this treated? There are many treatment choices and nearly as many questions about them. The decisions to treat or not to treat menopausal changes is an individual choice made with your health care provider. Your health care provider can discuss the treatments with you. Together, you can decide which treatment will work best for you. Your treatment choices may include:  Hormone therapy (estrogen and progesterone).  Non-hormonal medicines.  Treating the individual symptoms with medicine (for example antidepressants for depression).  Herbal medicines that may help specific symptoms.  Counseling by a psychiatrist or psychologist.  Group therapy.  Lifestyle changes including: ? Eating  healthy. ? Regular exercise. ? Limiting caffeine and alcohol. ? Stress management and meditation.  No treatment.  Follow these instructions at home:  Take the medicine your health care provider gives you as directed.  Get plenty of sleep and rest.  Exercise regularly.  Eat a diet that contains calcium (good for the bones) and soy products (acts like estrogen hormone).  Avoid alcoholic beverages.  Do not smoke.  If you have hot flashes, dress in layers.  Take supplements, calcium, and vitamin D to strengthen bones.  You can use over-the-counter lubricants or moisturizers for vaginal dryness.  Group therapy is sometimes very helpful.  Acupuncture may be helpful in some cases. Contact a health care provider if:  You are not sure you are in menopause.  You are having menopausal symptoms and need advice and treatment.  You are still having menstrual periods after age 53 years.  You have pain with intercourse.  Menopause is complete (no menstrual period for 12 months) and you develop vaginal bleeding.  You need a referral to a specialist (gynecologist, psychiatrist, or psychologist) for treatment. Get help right away if:  You have severe depression.  You have excessive vaginal bleeding.  You fell and think you have a broken bone.  You have pain when you urinate.  You develop leg or chest pain.  You have a fast pounding heart beat (palpitations).  You have severe headaches.  You develop vision problems.  You feel a lump in your breast.  You have abdominal pain or severe indigestion. This information is not intended to replace advice given to you by your health care provider. Make sure you discuss any questions you have with your health care provider. Document Released: 03/08/2004 Document Revised: 05/24/2016 Document Reviewed: 07/16/2013 Elsevier Interactive Patient Education  2017 [employer].

## 2018-09-23 ENCOUNTER — Other Ambulatory Visit (INDEPENDENT_AMBULATORY_CARE_PROVIDER_SITE_OTHER): Payer: 59

## 2018-09-23 DIAGNOSIS — Z1329 Encounter for screening for other suspected endocrine disorder: Secondary | ICD-10-CM | POA: Diagnosis not present

## 2018-09-23 DIAGNOSIS — R319 Hematuria, unspecified: Secondary | ICD-10-CM | POA: Diagnosis not present

## 2018-09-23 DIAGNOSIS — E559 Vitamin D deficiency, unspecified: Secondary | ICD-10-CM

## 2018-09-23 DIAGNOSIS — I10 Essential (primary) hypertension: Secondary | ICD-10-CM | POA: Diagnosis not present

## 2018-09-23 DIAGNOSIS — Z0184 Encounter for antibody response examination: Secondary | ICD-10-CM

## 2018-09-23 DIAGNOSIS — Z1159 Encounter for screening for other viral diseases: Secondary | ICD-10-CM | POA: Diagnosis not present

## 2018-09-23 DIAGNOSIS — E785 Hyperlipidemia, unspecified: Secondary | ICD-10-CM

## 2018-09-23 NOTE — Addendum Note (Signed)
Addended by: Arby Barrette on: 09/23/2018 08:00 AM   Modules accepted: Orders

## 2018-09-24 LAB — MEASLES/MUMPS/RUBELLA IMMUNITY
Mumps IgG: 86.2 AU/mL
Rubella: 10.8 index
Rubeola IgG: >300 AU/mL

## 2018-09-24 LAB — URINALYSIS, ROUTINE W REFLEX MICROSCOPIC
BILIRUBIN UA: NEGATIVE
GLUCOSE, UA: NEGATIVE
KETONES UA: NEGATIVE
Leukocytes, UA: NEGATIVE
Nitrite, UA: NEGATIVE
PROTEIN UA: NEGATIVE
RBC, UA: NEGATIVE
SPEC GRAV UA: 1.024 (ref 1.005–1.030)
UUROB: 0.2 mg/dL (ref 0.2–1.0)
pH, UA: 5 (ref 5.0–7.5)

## 2018-09-24 LAB — COMPREHENSIVE METABOLIC PANEL
AG Ratio: 1.6 (calc) (ref 1.0–2.5)
ALT: 13 U/L (ref 6–29)
AST: 14 U/L (ref 10–35)
Albumin: 4.2 g/dL (ref 3.6–5.1)
Alkaline phosphatase (APISO): 71 U/L (ref 33–130)
BILIRUBIN TOTAL: 0.4 mg/dL (ref 0.2–1.2)
BUN: 16 mg/dL (ref 7–25)
CALCIUM: 9.8 mg/dL (ref 8.6–10.4)
CO2: 23 mmol/L (ref 20–32)
Chloride: 103 mmol/L (ref 98–110)
Creat: 0.9 mg/dL (ref 0.50–1.05)
Globulin: 2.6 g/dL (calc) (ref 1.9–3.7)
Glucose, Bld: 91 mg/dL (ref 65–99)
POTASSIUM: 4.4 mmol/L (ref 3.5–5.3)
Sodium: 139 mmol/L (ref 135–146)
Total Protein: 6.8 g/dL (ref 6.1–8.1)

## 2018-09-24 LAB — CBC WITH DIFFERENTIAL/PLATELET
BASOS PCT: 0.4 %
Basophils Absolute: 20 cells/uL (ref 0–200)
EOS ABS: 138 {cells}/uL (ref 15–500)
Eosinophils Relative: 2.7 %
HCT: 41 % (ref 35.0–45.0)
Hemoglobin: 13.7 g/dL (ref 11.7–15.5)
Lymphs Abs: 1734 cells/uL (ref 850–3900)
MCH: 28.1 pg (ref 27.0–33.0)
MCHC: 33.4 g/dL (ref 32.0–36.0)
MCV: 84.2 fL (ref 80.0–100.0)
MONOS PCT: 11.7 %
MPV: 11 fL (ref 7.5–12.5)
NEUTROS PCT: 51.2 %
Neutro Abs: 2611 cells/uL (ref 1500–7800)
Platelets: 236 10*3/uL (ref 140–400)
RBC: 4.87 10*6/uL (ref 3.80–5.10)
RDW: 12.6 % (ref 11.0–15.0)
TOTAL LYMPHOCYTE: 34 %
WBC mixed population: 597 cells/uL (ref 200–950)
WBC: 5.1 10*3/uL (ref 3.8–10.8)

## 2018-09-24 LAB — LIPID PANEL
CHOL/HDL RATIO: 2.8 (calc) (ref <5.0)
CHOLESTEROL: 182 mg/dL (ref <200)
HDL: 65 mg/dL (ref >50)
LDL CHOLESTEROL (CALC): 104 mg/dL — AB
NON-HDL CHOLESTEROL (CALC): 117 mg/dL (ref <130)
TRIGLYCERIDES: 50 mg/dL (ref <150)

## 2018-09-24 LAB — T4, FREE: Free T4: 1.1 ng/dL (ref 0.8–1.8)

## 2018-09-24 LAB — VITAMIN D 25 HYDROXY (VIT D DEFICIENCY, FRACTURES): Vit D, 25-Hydroxy: 65 ng/mL (ref 30–100)

## 2018-09-24 LAB — TSH: TSH: 1.99 mIU/L (ref 0.40–4.50)

## 2018-09-30 ENCOUNTER — Telehealth: Payer: Self-pay | Admitting: Internal Medicine

## 2018-09-30 ENCOUNTER — Ambulatory Visit (INDEPENDENT_AMBULATORY_CARE_PROVIDER_SITE_OTHER): Payer: 59

## 2018-09-30 ENCOUNTER — Telehealth: Payer: Self-pay

## 2018-09-30 DIAGNOSIS — Z23 Encounter for immunization: Secondary | ICD-10-CM | POA: Diagnosis not present

## 2018-09-30 NOTE — Telephone Encounter (Signed)
Call patient  -she can stop high dose weekly D3 and take otc D3 5000 IU daily   -does she want Xray of her left knee ordered here?   Does she think fosamax is causing joint pain, is she still taking that?   [place]

## 2018-09-30 NOTE — Telephone Encounter (Signed)
Pt was seen for her NV today for her Hep B shot.   Pt wanted me to mention to PCP that she is still dealing with joint pain. Did go over lab results with pt. Pt advised. We did ssuggested pt to stay hydrated. Pt stated that she drinks more than 4 bottles of water daily. Did advise pt that if she doesn't drink enough water this can cause joint paint. Pt was c/o left knee joint pain.   Pt needs a refill on her Vit D Rx as well sent to [facility].

## 2018-09-30 NOTE — Progress Notes (Signed)
Pt was given 3rd Hep B left arm. Did seem to tolerate well.

## 2018-10-01 ENCOUNTER — Other Ambulatory Visit: Payer: Self-pay | Admitting: Internal Medicine

## 2018-10-01 DIAGNOSIS — M25562 Pain in left knee: Secondary | ICD-10-CM

## 2018-10-01 DIAGNOSIS — M810 Age-related osteoporosis without current pathological fracture: Secondary | ICD-10-CM

## 2018-10-01 MED ORDER — ALENDRONATE SODIUM 70 MG PO TABS: 70.0000 mg | ORAL_TABLET | ORAL | 0 refills | Status: DC

## 2018-10-01 NOTE — Telephone Encounter (Signed)
Ordered Xray pt can walk in for it anytime

## 2018-10-01 NOTE — Telephone Encounter (Signed)
Patient was informed of information.  Patient understood and no questions, comments, or concerns at this time. She is ok with xray and she is also taking the fosamax

## 2018-10-01 NOTE — Telephone Encounter (Signed)
Message has been left on VM .

## 2018-10-09 ENCOUNTER — Ambulatory Visit: Payer: 59 | Admitting: Family Medicine

## 2018-10-09 ENCOUNTER — Encounter: Payer: Self-pay | Admitting: Family Medicine

## 2018-10-09 VITALS — BP 120/78 | HR 74 | Temp 98.5°F | Ht 60.0 in | Wt 180.8 lb

## 2018-10-09 DIAGNOSIS — R059 Cough, unspecified: Secondary | ICD-10-CM

## 2018-10-09 DIAGNOSIS — R05 Cough: Secondary | ICD-10-CM | POA: Diagnosis not present

## 2018-10-09 DIAGNOSIS — R0982 Postnasal drip: Secondary | ICD-10-CM | POA: Diagnosis not present

## 2018-10-09 DIAGNOSIS — J019 Acute sinusitis, unspecified: Secondary | ICD-10-CM | POA: Diagnosis not present

## 2018-10-09 MED ORDER — AMOXICILLIN-POT CLAVULANATE 875-125 MG PO TABS: 1.0000 | ORAL_TABLET | Freq: Two times a day (BID) | ORAL | 0 refills | Status: DC

## 2018-10-09 NOTE — Patient Instructions (Signed)
Sinus Rinse What is a sinus rinse? A sinus rinse is a home treatment. It rinses your sinuses with a mixture of salt and water (saline solution). Sinuses are air-filled spaces in your skull behind the bones of your face and forehead. They open into your nasal cavity. To do a sinus rinse, you will need:  Saline solution.  Neti pot or spray bottle. This releases the saline solution into your nose and through your sinuses. You can buy neti pots and spray bottles at: ? Your local pharmacy. ? A health food store. ? Online.  When should I do a sinus rinse? A sinus rinse can help to clear your nasal cavity. It can clear:  Mucus.  Dirt.  Dust.  Pollen.  You may do a sinus rinse when you have:  A cold.  A virus.  Allergies.  A sinus infection.  A stuffy nose.  If you are considering a sinus rinse:  Ask your child's doctor before doing a sinus rinse on your child.  Do not do a sinus rinse if you have had: ? Ear or nasal surgery. ? An ear infection. ? Blocked ears.  How do I do a sinus rinse?  Wash your hands.  Disinfect your device using the directions that came with the device.  Dry your device.  Use the solution that comes with your device or one that is sold separately in stores. Follow the mixing directions on the package.  Fill your device with the amount of saline solution as stated in the device instructions.  Stand over a sink and tilt your head sideways over the sink.  Place the spout of the device in your upper nostril (the one closer to the ceiling).  Gently pour or squeeze the saline solution into the nasal cavity. The liquid should drain to the lower nostril if you are not too congested.  Gently blow your nose. Blowing too hard may cause ear pain.  Repeat in the other nostril.  Clean and rinse your device with clean water.  Air-dry your device. Are there risks of a sinus rinse? Sinus rinse is normally very safe and helpful. However, there are a  few risks, which include:  A burning feeling in the sinuses. This may happen if you do not make the saline solution as instructed. Make sure to follow all directions when making the saline solution.  Infection from unclean water. This is rare, but possible.  Nasal irritation.  This information is not intended to replace advice given to you by your health care provider. Make sure you discuss any questions you have with your health care provider. Document Released: 07/14/2014 Document Revised: 11/13/2016 Document Reviewed: 05/04/2014 Elsevier Interactive Patient Education  2017 [employer]  

## 2018-10-09 NOTE — Progress Notes (Signed)
   Subjective:    Patient ID: Joyce Simpson, female    DOB: 09-Jan-1961, 57 y.o.   MRN: [id number]  HPI  Presents to clinic c/o yellow mucus draining from nose, post nasal drip, sinus pain, pressure felt in teeth & headache for 10 days. She also has cough with phlegm. She uses flonase with some mild relief of congestion.   Denies fever or chills. Denies SOB or wheezing.    Patient Active Problem List   Diagnosis Date Noted  . Osteoporosis 02/28/2018  . Vitamin D deficiency 11/05/2017  . Environmental and seasonal allergies 03/29/2017  . Dermatitis 07/12/2016  . Acute low back pain without sciatica 05/29/2016  . Numbness and tingling in right hand 05/29/2016  . Cardiac murmur 10/31/2015  . Hyperlipidemia 06/14/2015  . B12 deficiency 06/08/2015  . ESSENTIAL HYPERTENSION, BENIGN 01/29/2011  . ABNORMAL ELECTROCARDIOGRAM 01/29/2011   Social History   Tobacco Use  . Smoking status: Never Smoker  . Smokeless tobacco: Never Used  Substance Use Topics  . Alcohol use: No   Review of Systems  Constitutional: Negative for chills, fatigue and fever.  HENT: Negative for congestion, ear pain, sinus pain and sore throat.   Eyes: Negative.   Respiratory: yellow mucus. Negative for cough, shortness of breath and wheezing.   Cardiovascular: Negative for chest pain, palpitations and leg swelling.  Gastrointestinal: Negative for abdominal pain, diarrhea, nausea and vomiting.  Genitourinary: Negative for dysuria, frequency and urgency.  Musculoskeletal: Negative for arthralgias and myalgias.  Skin: Negative for color change, pallor and rash.  Neurological: Negative for syncope, light-headedness and headaches.  Psychiatric/Behavioral: The patient is not nervous/anxious.       Objective:   Physical Exam  Constitutional: No distress.  HENT:  Head: Normocephalic and atraumatic.  Nose: Mucosal edema, rhinorrhea and sinus tenderness present. Right sinus exhibits no maxillary sinus tenderness  and no frontal sinus tenderness. Left sinus exhibits no maxillary sinus tenderness and no frontal sinus tenderness.  +post nasal drip  Eyes: EOM are normal. Right eye exhibits no discharge. Left eye exhibits no discharge. No scleral icterus.  Neck: No tracheal deviation present.  Cardiovascular: Normal rate, regular rhythm and normal heart sounds.  Pulmonary/Chest: Effort normal and breath sounds normal. No respiratory distress. She has no wheezes. She has no rales.  Skin: Skin is warm and dry. No pallor.  Psychiatric: She has a normal mood and affect. Her behavior is normal.  Nursing note and vitals reviewed.  Vitals:   10/09/18 1454  BP: 120/78  Pulse: 74  Temp: 98.5 F (36.9 C)  SpO2: 98%      Assessment & Plan:   Acute sinusitis/nasal congestion -- Augmentin BID for 10 day. Rest, increase fluids. Patient will continue Flonase nasal spray and also given hand out on how to do saline nasal rinses.  Post nasal drip/cough -- Cough related to post nasal drip, lungs are clear. Advised she can use OTC cough med like robitussin syrup or mucinex tablets.   Keep regularly scheduled follow up as planned. Return to clinic sooner if issues persist or worsen.

## 2018-10-10 ENCOUNTER — Telehealth: Payer: Self-pay | Admitting: Internal Medicine

## 2018-10-10 NOTE — Telephone Encounter (Signed)
FYI-I called pt to inform her about where she can get her xray done. Pt stated that her knee is not hurting her the only problem she's having is her toes cramping.

## 2018-10-10 NOTE — Telephone Encounter (Signed)
Increase water intake for cramping  I want her to still get Xray next week of knee  Inform patient  Joyce Simpson

## 2018-10-14 NOTE — Telephone Encounter (Signed)
Patients husband was informed.  Patients husnamd understood and no questions, comments, or concerns at this time.

## 2018-11-20 ENCOUNTER — Encounter: Payer: Self-pay | Admitting: Internal Medicine

## 2018-11-20 ENCOUNTER — Ambulatory Visit: Payer: 59 | Admitting: Internal Medicine

## 2018-11-20 VITALS — BP 130/72 | HR 66 | Temp 97.9°F | Ht 60.0 in | Wt 182.6 lb

## 2018-11-20 DIAGNOSIS — E785 Hyperlipidemia, unspecified: Secondary | ICD-10-CM

## 2018-11-20 DIAGNOSIS — K5900 Constipation, unspecified: Secondary | ICD-10-CM | POA: Diagnosis not present

## 2018-11-20 DIAGNOSIS — Z1231 Encounter for screening mammogram for malignant neoplasm of breast: Secondary | ICD-10-CM | POA: Diagnosis not present

## 2018-11-20 DIAGNOSIS — M810 Age-related osteoporosis without current pathological fracture: Secondary | ICD-10-CM | POA: Diagnosis not present

## 2018-11-20 DIAGNOSIS — I10 Essential (primary) hypertension: Secondary | ICD-10-CM | POA: Diagnosis not present

## 2018-11-20 DIAGNOSIS — E559 Vitamin D deficiency, unspecified: Secondary | ICD-10-CM | POA: Diagnosis not present

## 2018-11-20 NOTE — Progress Notes (Signed)
Pre visit review using our clinic review tool, if applicable. No additional management support is needed unless otherwise documented below in the visit note. 

## 2018-11-20 NOTE — Patient Instructions (Addendum)
D3 2000 IU daily otc  Linzess 145 daily for constipation  If linzess not helping warm prune juice, increasing fiber (fiber gummies or metamucil or benefiber)  Constipation, Adult Constipation is when a person has fewer bowel movements in a week than normal, has difficulty having a bowel movement, or has stools that are dry, hard, or larger than normal. Constipation may be caused by an underlying condition. It may become worse with age if a person takes certain medicines and does not take in enough fluids. Follow these instructions at home: Eating and drinking   Eat foods that have a lot of fiber, such as fresh fruits and vegetables, whole grains, and beans.  Limit foods that are high in fat, low in fiber, or overly processed, such as french fries, hamburgers, cookies, candies, and soda.  Drink enough fluid to keep your urine clear or pale yellow. General instructions  Exercise regularly or as told by your health care provider.  Go to the restroom when you have the urge to go. Do not hold it in.  Take over-the-counter and prescription medicines only as told by your health care provider. These include any fiber supplements.  Practice pelvic floor retraining exercises, such as deep breathing while relaxing the lower abdomen and pelvic floor relaxation during bowel movements.  Watch your condition for any changes.  Keep all follow-up visits as told by your health care provider. This is important. Contact a health care provider if:  You have pain that gets worse.  You have a fever.  You do not have a bowel movement after 4 days.  You vomit.  You are not hungry.  You lose weight.  You are bleeding from the anus.  You have thin, pencil-like stools. Get help right away if:  You have a fever and your symptoms suddenly get worse.  You leak stool or have blood in your stool.  Your abdomen is bloated.  You have severe pain in your abdomen.  You feel dizzy or you  faint. This information is not intended to replace advice given to you by your health care provider. Make sure you discuss any questions you have with your health care provider. Document Released: 09/14/2004 Document Revised: 07/06/2016 Document Reviewed: 06/06/2016 Elsevier Interactive Patient Education  2018 [employer].     Cholesterol Cholesterol is a white, waxy, fat-like substance that is needed by the human body in small amounts. The liver makes all the cholesterol we need. Cholesterol is carried from the liver by the blood through the blood vessels. Deposits of cholesterol (plaques) may build up on blood vessel (artery) walls. Plaques make the arteries narrower and stiffer. Cholesterol plaques increase the risk for heart attack and stroke. You cannot feel your cholesterol level even if it is very high. The only way to know that it is high is to have a blood test. Once you know your cholesterol levels, you should keep a record of the test results. Work with your health care provider to keep your levels in the desired range. What do the results mean?  Total cholesterol is a rough measure of all the cholesterol in your blood.  LDL (low-density lipoprotein) is the "bad" cholesterol. This is the type that causes plaque to build up on the artery walls. You want this level to be low.  HDL (high-density lipoprotein) is the "good" cholesterol because it cleans the arteries and carries the LDL away. You want this level to be high.  Triglycerides are fat that the body can  either burn for energy or store. High levels are closely linked to heart disease. What are the desired levels of cholesterol?  Total cholesterol below 200.  LDL below 100 for people who are at risk, below 70 for people at very high risk.  HDL above 40 is good. A level of 60 or higher is considered to be protective against heart disease.  Triglycerides below 150. How can I lower my cholesterol? Diet Follow your diet  program as told by your health care provider.  Choose fish or white meat chicken and [place], roasted or baked. Limit fatty cuts of red meat, fried foods, and processed meats, such as sausage and lunch meats.  Eat lots of fresh fruits and vegetables.  Choose whole grains, beans, pasta, potatoes, and cereals.  Choose olive oil, corn oil, or canola oil, and use only small amounts.  Avoid butter, mayonnaise, shortening, or palm kernel oils.  Avoid foods with trans fats.  Drink skim or nonfat milk and eat low-fat or nonfat yogurt and cheeses. Avoid whole milk, cream, ice cream, egg yolks, and full-fat cheeses.  Healthier desserts include angel food cake, ginger snaps, animal crackers, hard candy, popsicles, and low-fat or nonfat frozen yogurt. Avoid pastries, cakes, pies, and cookies.  Exercise  Follow your exercise program as told by your health care provider. A regular program: ? Helps to decrease LDL and raise HDL. ? Helps with weight control.  Do things that increase your activity level, such as gardening, walking, and taking the stairs.  Ask your health care provider about ways that you can be more active in your daily life.  Medicine  Take over-the-counter and prescription medicines only as told by your health care provider. ? Medicine may be prescribed by your health care provider to help lower cholesterol and decrease the risk for heart disease. This is usually done if diet and exercise have failed to bring down cholesterol levels. ? If you have several risk factors, you may need medicine even if your levels are normal.  This information is not intended to replace advice given to you by your health care provider. Make sure you discuss any questions you have with your health care provider. Document Released: 09/11/2001 Document Revised: 07/14/2016 Document Reviewed: 06/16/2016 Elsevier Interactive Patient Education  2018 [employer].   Denosumab injection/Prolia every 6  months  What is this medicine? DENOSUMAB (den oh sue mab) slows bone breakdown. Prolia is used to treat osteoporosis in women after menopause and in men. Delton See is used to treat a high calcium level due to cancer and to prevent bone fractures and other bone problems caused by multiple myeloma or cancer bone metastases. Delton See is also used to treat giant cell tumor of the bone. This medicine may be used for other purposes; ask your health care provider or pharmacist if you have questions. COMMON BRAND NAME(S): Prolia, XGEVA What should I tell my health care provider before I take this medicine? They need to know if you have any of these conditions: -dental disease -having surgery or tooth extraction -infection -kidney disease -low levels of calcium or Vitamin D in the blood -malnutrition -on hemodialysis -skin conditions or sensitivity -thyroid or parathyroid disease -an unusual reaction to denosumab, other medicines, foods, dyes, or preservatives -pregnant or trying to get pregnant -breast-feeding How should I use this medicine? This medicine is for injection under the skin. It is given by a health care professional in a hospital or clinic setting. If you are getting Prolia, a special  MedGuide will be given to you by the pharmacist with each prescription and refill. Be sure to read this information carefully each time. For Prolia, talk to your pediatrician regarding the use of this medicine in children. Special care may be needed. For Delton See, talk to your pediatrician regarding the use of this medicine in children. While this drug may be prescribed for children as young as 13 years for selected conditions, precautions do apply. Overdosage: If you think you have taken too much of this medicine contact a poison control center or emergency room at once. NOTE: This medicine is only for you. Do not share this medicine with others. What if I miss a dose? It is important not to miss your dose. Call  your doctor or health care professional if you are unable to keep an appointment. What may interact with this medicine? Do not take this medicine with any of the following medications: -other medicines containing denosumab This medicine may also interact with the following medications: -medicines that lower your chance of fighting infection -steroid medicines like prednisone or cortisone This list may not describe all possible interactions. Give your health care provider a list of all the medicines, herbs, non-prescription drugs, or dietary supplements you use. Also tell them if you smoke, drink alcohol, or use illegal drugs. Some items may interact with your medicine. What should I watch for while using this medicine? Visit your doctor or health care professional for regular checks on your progress. Your doctor or health care professional may order blood tests and other tests to see how you are doing. Call your doctor or health care professional for advice if you get a fever, chills or sore throat, or other symptoms of a cold or flu. Do not treat yourself. This drug may decrease your body's ability to fight infection. Try to avoid being around people who are sick. You should make sure you get enough calcium and vitamin D while you are taking this medicine, unless your doctor tells you not to. Discuss the foods you eat and the vitamins you take with your health care professional. See your dentist regularly. Brush and floss your teeth as directed. Before you have any dental work done, tell your dentist you are receiving this medicine. Do not become pregnant while taking this medicine or for 5 months after stopping it. Talk with your doctor or health care professional about your birth control options while taking this medicine. Women should inform their doctor if they wish to become pregnant or think they might be pregnant. There is a potential for serious side effects to an unborn child. Talk to your  health care professional or pharmacist for more information. What side effects may I notice from receiving this medicine? Side effects that you should report to your doctor or health care professional as soon as possible: -allergic reactions like skin rash, itching or hives, swelling of the face, lips, or tongue -bone pain -breathing problems -dizziness -jaw pain, especially after dental work -redness, blistering, peeling of the skin -signs and symptoms of infection like fever or chills; cough; sore throat; pain or trouble passing urine -signs of low calcium like fast heartbeat, muscle cramps or muscle pain; pain, tingling, numbness in the hands or feet; seizures -unusual bleeding or bruising -unusually weak or tired Side effects that usually do not require medical attention (report to your doctor or health care professional if they continue or are bothersome): -constipation -diarrhea -headache -joint pain -loss of appetite -muscle pain -runny nose -tiredness -  upset stomach This list may not describe all possible side effects. Call your doctor for medical advice about side effects. You may report side effects to FDA at 1-800-FDA-1088. Where should I keep my medicine? This medicine is only given in a clinic, doctor's office, or other health care setting and will not be stored at home. NOTE: This sheet is a summary. It may not cover all possible information. If you have questions about this medicine, talk to your doctor, pharmacist, or health care provider.  2018 Elsevier/Gold Standard (2017-01-08 19:17:21)  Irritable Bowel Syndrome, Adult Irritable bowel syndrome (IBS) is not one specific disease. It is a group of symptoms that affects the organs responsible for digestion (gastrointestinal or GI tract). To regulate how your GI tract works, your body sends signals back and forth between your intestines and your brain. If you have IBS, there may be a problem with these signals. As a  result, your GI tract does not function normally. Your intestines may become more sensitive and overreact to certain things. This is especially true when you eat certain foods or when you are under stress. There are four types of IBS. These may be determined based on the consistency of your stool:  IBS with diarrhea.  IBS with constipation.  Mixed IBS.  Unsubtyped IBS.  It is important to know which type of IBS you have. Some treatments are more likely to be helpful for certain types of IBS. What are the causes? The exact cause of IBS is not known. What increases the risk? You may have a higher risk of IBS if:  You are a woman.  You are younger than 57 years old.  You have a family history of IBS.  You have mental health problems.  You have had bacterial infection of your GI tract.  What are the signs or symptoms? Symptoms of IBS vary from person to person. The main symptom is abdominal pain or discomfort. Additional symptoms usually include one or more of the following:  Diarrhea, constipation, or both.  Abdominal swelling or bloating.  Feeling full or sick after eating a small or regular-size meal.  Frequent gas.  Mucus in the stool.  A feeling of having more stool left after a bowel movement.  Symptoms tend to come and go. They may be associated with stress, psychiatric conditions, or nothing at all. How is this diagnosed? There is no specific test to diagnose IBS. Your health care provider will make a diagnosis based on a physical exam, medical history, and your symptoms. You may have other tests to rule out other conditions that may be causing your symptoms. These may include:  Blood tests.  X-rays.  CT scan.  Endoscopy and colonoscopy. This is a test in which your GI tract is viewed with a long, thin, flexible tube.  How is this treated? There is no cure for IBS, but treatment can help relieve symptoms. IBS treatment often includes:  Changes to your  diet, such as: ? Eating more fiber. ? Avoiding foods that cause symptoms. ? Drinking more water. ? Eating regular, medium-sized portioned meals.  Medicines. These may include: ? Fiber supplements if you have constipation. ? Medicine to control diarrhea (antidiarrheal medicines). ? Medicine to help control muscle spasms in your GI tract (antispasmodic medicines). ? Medicines to help with any mental health issues, such as antidepressants or tranquilizers.  Therapy. ? Talk therapy may help with anxiety, depression, or other mental health issues that can make IBS symptoms worse.  Stress  reduction. ? Managing your stress can help keep symptoms under control.  Follow these instructions at home:  Take medicines only as directed by your health care provider.  Eat a healthy diet. ? Avoid foods and drinks with added sugar. ? Include more whole grains, fruits, and vegetables gradually into your diet. This may be especially helpful if you have IBS with constipation. ? Avoid any foods and drinks that make your symptoms worse. These may include dairy products and caffeinated or carbonated drinks. ? Do not eat large meals. ? Drink enough fluid to keep your urine clear or pale yellow.  Exercise regularly. Ask your health care provider for recommendations of good activities for you.  Keep all follow-up visits as directed by your health care provider. This is important. Contact a health care provider if:  You have constant pain.  You have trouble or pain with swallowing.  You have worsening diarrhea. Get help right away if:  You have severe and worsening abdominal pain.  You have diarrhea and: ? You have a rash, stiff neck, or severe headache. ? You are irritable, sleepy, or difficult to awaken. ? You are weak, dizzy, or extremely thirsty.  You have bright red blood in your stool or you have black tarry stools.  You have unusual abdominal swelling that is painful.  You vomit  continuously.  You vomit blood (hematemesis).  You have both abdominal pain and a fever. This information is not intended to replace advice given to you by your health care provider. Make sure you discuss any questions you have with your health care provider. Document Released: 12/17/2005 Document Revised: 05/18/2016 Document Reviewed: 09/03/2014 Elsevier Interactive Patient Education  2018 [employer].  Diet for Irritable Bowel Syndrome When you have irritable bowel syndrome (IBS), the foods you eat and your eating habits are very important. IBS may cause various symptoms, such as abdominal pain, constipation, or diarrhea. Choosing the right foods can help ease discomfort caused by these symptoms. Work with your health care provider and dietitian to find the best eating plan to help control your symptoms. What general guidelines do I need to follow?  Keep a food diary. This will help you identify foods that cause symptoms. Write down: ? What you eat and when. ? What symptoms you have. ? When symptoms occur in relation to your meals.  Avoid foods that cause symptoms. Talk with your dietitian about other ways to get the same nutrients that are in these foods.  Eat more foods that contain fiber. Take a fiber supplement if directed by your dietitian.  Eat your meals slowly, in a relaxed setting.  Aim to eat 5-6 small meals per day. Do not skip meals.  Drink enough fluids to keep your urine clear or pale yellow.  Ask your health care provider if you should take an over-the-counter probiotic during flare-ups to help restore healthy gut bacteria.  If you have cramping or diarrhea, try making your meals low in fat and high in carbohydrates. Examples of carbohydrates are pasta, rice, whole grain breads and cereals, fruits, and vegetables.  If dairy products cause your symptoms to flare up, try eating less of them. You might be able to handle yogurt better than other dairy products because  it contains bacteria that help with digestion. What foods are not recommended? The following are some foods and drinks that may worsen your symptoms:  Fatty foods, such as [place] fries.  Milk products, such as cheese or ice cream.  Chocolate.  Alcohol.  Products with caffeine, such as coffee.  Carbonated drinks, such as soda.  The items listed above may not be a complete list of foods and beverages to avoid. Contact your dietitian for more information. What foods are good sources of fiber? Your health care provider or dietitian may recommend that you eat more foods that contain fiber. Fiber can help reduce constipation and other IBS symptoms. Add foods with fiber to your diet a little at a time so that your body can get used to them. Too much fiber at once might cause gas and swelling of your abdomen. The following are some foods that are good sources of fiber:  Apples.  Peaches.  Pears.  Berries.  Figs.  Broccoli (raw).  Cabbage.  Carrots.  Raw peas.  Kidney beans.  Lima beans.  Whole grain bread.  Whole grain cereal.  Where to find more information: [employer] for Functional Gastrointestinal Disorders: www.iffgd.[employer] of Diabetes and Digestive and Kidney Diseases: [employer].co.za.aspx This information is not intended to replace advice given to you by your health care provider. Make sure you discuss any questions you have with your health care provider. Document Released: 03/08/2004 Document Revised: 05/24/2016 Document Reviewed: 03/19/2014 Elsevier Interactive Patient Education  2018 [employer].

## 2018-11-20 NOTE — Progress Notes (Signed)
Chief Complaint  Patient presents with  . Follow-up   F/u  1. HTN controlled on norvasc 5 mg qd, benazepril 40 mg qd  2. Osteoporosis fosamax pt is not able to tolerate causing bone pain  3. C/o chronic constipation and abdominal pain when constipated x years.   Review of Systems  Constitutional: Negative for weight loss.  HENT: Negative for hearing loss.   Eyes: Negative for blurred vision.  Respiratory: Negative for shortness of breath.   Cardiovascular: Negative for chest pain.  Gastrointestinal: Negative for abdominal pain.  Musculoskeletal: Negative for falls.  Skin: Negative for rash.  Neurological: Negative for headaches.  Psychiatric/Behavioral: Negative for depression.   Past Medical History:  Diagnosis Date  . Breast discharge    LEFT BREAST -- Milky and Clear  . Diverticulosis   . Heart murmur   . Hyperlipidemia   . Hypertension   . Migraines   . Osteoporosis    Past Surgical History:  Procedure Laterality Date  . ABDOMINAL HYSTERECTOMY     dub 2008/2009 h/o abnormal pap   . BACK SURGERY     cervical spine [place]  . CESAREAN SECTION     x2   . COLONOSCOPY WITH PROPOFOL N/A 04/11/2018   Procedure: COLONOSCOPY WITH PROPOFOL;  Surgeon: Jonathon Bellows, MD;  Location: [facility] ENDOSCOPY;  Service: Gastroenterology;  Laterality: N/A;  . COLONOSCOPY WITH PROPOFOL N/A 06/20/2018   Procedure: COLONOSCOPY WITH PROPOFOL;  Surgeon: Jonathon Bellows, MD;  Location: [facility] ENDOSCOPY;  Service: Gastroenterology;  Laterality: N/A;   Family History  Problem Relation Age of Onset  . Hypertension Mother   . AAA (abdominal aortic aneurysm) Mother   . Aneurysm Mother   . Cancer Brother        Colon  . Hypertension Brother   . Diabetes Daughter        type 1   . Asthma Sister   . Cancer Brother        colon cancer   . AAA (abdominal aortic aneurysm) Brother   . Anuerysm Brother   . Cancer Maternal Aunt        Breast  . Breast cancer Maternal Aunt   . Cancer Paternal Aunt       Breast  . Breast cancer Paternal Aunt    Social History   Socioeconomic History  . Marital status: Married    Spouse name: Not on file  . Number of children: Not on file  . Years of education: Not on file  . Highest education level: Not on file  Occupational History  . Not on file  Social Needs  . Financial resource strain: Not on file  . Food insecurity:    Worry: Not on file    Inability: Not on file  . Transportation needs:    Medical: Not on file    Non-medical: Not on file  Tobacco Use  . Smoking status: Never Smoker  . Smokeless tobacco: Never Used  Substance and Sexual Activity  . Alcohol use: No  . Drug use: No  . Sexual activity: Not on file  Lifestyle  . Physical activity:    Days per week: Not on file    Minutes per session: Not on file  . Stress: Not on file  Relationships  . Social connections:    Talks on phone: Not on file    Gets together: Not on file    Attends religious service: Not on file    Active member of club or organization: Not  on file    Attends meetings of clubs or organizations: Not on file    Relationship status: Not on file  . Intimate partner violence:    Fear of current or ex partner: Not on file    Emotionally abused: Not on file    Physically abused: Not on file    Forced sexual activity: Not on file  Other Topics Concern  . 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 alendronate (FOSAMAX) 70 MG tablet Take 1 tablet (70 mg total) by mouth once a week. Take with a full glass of water on an empty stomach.  Marland Kitchen amLODipine (NORVASC) 5 MG tablet Take 1 tablet (5 mg total) by mouth daily.  Marland Kitchen amoxicillin-clavulanate (AUGMENTIN) 875-125 MG tablet Take 1 tablet by mouth 2 (two) times daily.  . benazepril (LOTENSIN) 40 MG tablet Take 1 tablet (40 mg total) by mouth daily.  .  hydrochlorothiazide (HYDRODIURIL) 12.5 MG tablet Take 1 tablet (12.5 mg total) by mouth daily.   Allergies  Allergen Reactions  . Fosamax [Alendronate Sodium]     GI upset    Recent Results (from the past 2160 hour(s))  Urinalysis, Routine w reflex microscopic     Status: Abnormal   Collection Time: 09/23/18  8:00 AM  Result Value Ref Range   Specific Gravity, UA 1.024 1.005 - 1.030   pH, UA 5.0 5.0 - 7.5   Color, UA Yellow Yellow   Appearance Ur Turbid (A) Clear   Leukocytes, UA Negative Negative   Protein, UA Negative Negative/Trace   Glucose, UA Negative Negative   Ketones, UA Negative Negative   RBC, UA Negative Negative   Bilirubin, UA Negative Negative   Urobilinogen, Ur 0.2 0.2 - 1.0 mg/dL   Nitrite, UA Negative Negative   Microscopic Examination Comment     Comment: Microscopic not indicated and not performed.  Measles/Mumps/Rubella Immunity     Status: None   Collection Time: 09/23/18  8:00 AM  Result Value Ref Range   Rubeola IgG >300.00 AU/mL    Comment: AU/mL            Interpretation -----            -------------- <25.00           Negative 25.00-29.99      Equivocal >29.99           Positive . A positive result indicates that the patient has antibody to measles virus. It does not differentiate  between an active or past infection. The clinical  diagnosis must be interpreted in conjunction with  clinical signs and symptoms of the patient.    Mumps IgG 86.20 AU/mL    Comment:  AU/mL           Interpretation -------         ---------------- <9.00             Negative 9.00-10.99        Equivocal >10.99            Positive A positive result indicates that the patient has  antibody to mumps virus. It does not differentiate between an  active or past infection. The clinical diagnosis must be interpreted in conjunction with clinical signs and symptoms of the patient. Marland Kitchen  Rubella 10.80 index    Comment:     Index            Interpretation     -----             --------------       <0.90            Not consistent with Immunity     0.90-0.99        Equivocal     > or = 1.00      Consistent with Immunity  . The presence of rubella IgG antibody suggests  immunization or past or current infection with rubella virus.   Vitamin D (25 hydroxy)     Status: None   Collection Time: 09/23/18  8:00 AM  Result Value Ref Range   Vit D, 25-Hydroxy 65 30 - 100 ng/mL    Comment: Vitamin D Status         25-OH Vitamin D: . Deficiency:                    <20 ng/mL Insufficiency:             20 - 29 ng/mL Optimal:                 > or = 30 ng/mL . For 25-OH Vitamin D testing on patients on  D2-supplementation and patients for whom quantitation  of D2 and D3 fractions is required, the QuestAssureD(TM) 25-OH VIT D, (D2,D3), LC/MS/MS is recommended: order  code [id number] (patients >36yrs). . For more information on this test, go to: http://education.questdiagnostics.com/faq/FAQ163 (This link is being provided for  informational/educational purposes only.)   Lipid panel     Status: Abnormal   Collection Time: 09/23/18  8:00 AM  Result Value Ref Range   Cholesterol 182 <200 mg/dL   HDL 65 >50 mg/dL   Triglycerides 50 <150 mg/dL   LDL Cholesterol (Calc) 104 (H) mg/dL (calc)    Comment: Reference range: <100 . Desirable range <100 mg/dL for primary prevention;   <70 mg/dL for patients with CHD or diabetic patients  with > or = 2 CHD risk factors. Marland Kitchen LDL-C is now calculated using the Martin-Hopkins  calculation, which is a validated novel method providing  better accuracy than the Friedewald equation in the  estimation of LDL-C.  Cresenciano Genre et al. Annamaria Helling. 7654;650(35): 2061-2068  (http://education.[facility].com/faq/FAQ164)    Total CHOL/HDL Ratio 2.8 <5.0 (calc)   Non-HDL Cholesterol (Calc) 117 <130 mg/dL (calc)    Comment: For patients with diabetes plus 1 major ASCVD risk  factor, treating to a non-HDL-C goal of <100 mg/dL  (LDL-C of <70  mg/dL) is considered a therapeutic  option.   T4, free     Status: None   Collection Time: 09/23/18  8:00 AM  Result Value Ref Range   Free T4 1.1 0.8 - 1.8 ng/dL  TSH     Status: None   Collection Time: 09/23/18  8:00 AM  Result Value Ref Range   TSH 1.99 0.40 - 4.50 mIU/L  Comprehensive metabolic panel     Status: None   Collection Time: 09/23/18  8:00 AM  Result Value Ref Range   Glucose, Bld 91 65 - 99 mg/dL    Comment: .            Fasting reference interval .    BUN 16 7 - 25 mg/dL   Creat 0.90 0.50 - 1.05 mg/dL    Comment: For patients >  11 years of age, the reference limit for Creatinine is approximately 13% higher for people identified as African-American. .    BUN/Creatinine Ratio NOT APPLICABLE 6 - 22 (calc)   Sodium 139 135 - 146 mmol/L   Potassium 4.4 3.5 - 5.3 mmol/L   Chloride 103 98 - 110 mmol/L   CO2 23 20 - 32 mmol/L   Calcium 9.8 8.6 - 10.4 mg/dL   Total Protein 6.8 6.1 - 8.1 g/dL   Albumin 4.2 3.6 - 5.1 g/dL   Globulin 2.6 1.9 - 3.7 g/dL (calc)   AG Ratio 1.6 1.0 - 2.5 (calc)   Total Bilirubin 0.4 0.2 - 1.2 mg/dL   Alkaline phosphatase (APISO) 71 33 - 130 U/L   AST 14 10 - 35 U/L   ALT 13 6 - 29 U/L  CBC with Differential/Platelet     Status: None   Collection Time: 09/23/18  8:00 AM  Result Value Ref Range   WBC 5.1 3.8 - 10.8 Thousand/uL   RBC 4.87 3.80 - 5.10 Million/uL   Hemoglobin 13.7 11.7 - 15.5 g/dL   HCT 41.0 35.0 - 45.0 %   MCV 84.2 80.0 - 100.0 fL   MCH 28.1 27.0 - 33.0 pg   MCHC 33.4 32.0 - 36.0 g/dL   RDW 12.6 11.0 - 15.0 %   Platelets 236 140 - 400 Thousand/uL   MPV 11.0 7.5 - 12.5 fL   Neutro Abs 2,611 1,500 - 7,800 cells/uL   Lymphs Abs 1,734 850 - 3,900 cells/uL   WBC mixed population 597 200 - 950 cells/uL   Eosinophils Absolute 138 15 - 500 cells/uL   Basophils Absolute 20 0 - 200 cells/uL   Neutrophils Relative % 51.2 %   Total Lymphocyte 34.0 %   Monocytes Relative 11.7 %   Eosinophils Relative 2.7 %   Basophils  Relative 0.4 %   Objective  Body mass index is 35.66 kg/m. Wt Readings from Last 3 Encounters:  11/20/18 182 lb 9.6 oz (82.8 kg)  10/09/18 180 lb 12.8 oz (82 kg)  07/28/18 178 lb 4 oz (80.9 kg)   Temp Readings from Last 3 Encounters:  11/20/18 97.9 F (36.6 C) (Oral)  10/09/18 98.5 F (36.9 C) (Oral)  07/28/18 98.4 F (36.9 C) (Oral)   BP Readings from Last 3 Encounters:  11/20/18 130/72  10/09/18 120/78  07/28/18 134/76   Pulse Readings from Last 3 Encounters:  11/20/18 66  10/09/18 74  07/28/18 61    Physical Exam  Constitutional: She is oriented to person, place, and time. Vital signs are normal. She appears well-developed and well-nourished. She is cooperative.  HENT:  Head: Normocephalic and atraumatic.  Mouth/Throat: Oropharynx is clear and moist and mucous membranes are normal.  Eyes: Pupils are equal, round, and reactive to light. Conjunctivae are normal.  Cardiovascular: Normal rate and regular rhythm.  Murmur heard. Pulmonary/Chest: Effort normal and breath sounds normal.  Neurological: She is alert and oriented to person, place, and time. Gait normal.  Skin: Skin is warm, dry and intact.  Psychiatric: She has a normal mood and affect. Her speech is normal and behavior is normal. Judgment and thought content normal. Cognition and memory are normal.  Nursing note and vitals reviewed.   Assessment   1. HTN controlled  2. Mild HLD 3. Osteoporosis  4. Constipation, chronic vs IBS-C 5. HM Plan   1. Cont meds  Reviewed labs  2. rec exercise and healthy diet choices  3. D/c fosamax caused joint  pain will Rx Prolia q6 months  4. Increase water intake  Trial of linzess 145 2 bottles =8 pills 1 pill daily  Given IBS-C info  5.  Never had flu shot  Tdap utd Hep c neg  Hep B vaccines 3/3   mammo neg 06/19/18   DEXA 06/17/17 +osteoporosisdue again 03/04/2019  -d/c fosamax today try to get prolia approved   05/01/17 pap neg neg HPV s/p hysterectomy and  h/o abnormal pap sees Dr. [facility]itt [facility]   [facility] EGD and colonoscopy had <50 and >5 years need to get records.  -With [place] 2 brothers colon cancer refer to Dr. Cherlyn Labella has appt colonoscopy 04/11/18 Dr. Audria Nine MDs -colonoscopy had 04/11/18 polyp and repeat 06/20/18 polpy +tubular adenoma given size GI [facility] rec repeat in 3 years   rec D3 2000 IU qd    Provider: Dr. Olivia Mackie McLean-Scocuzza-Internal Medicine

## 2018-11-26 NOTE — Progress Notes (Signed)
Insurance verification for Prolia filed on [employer] Portal. 

## 2019-01-05 ENCOUNTER — Encounter: Payer: Self-pay | Admitting: Internal Medicine

## 2019-01-22 ENCOUNTER — Ambulatory Visit: Payer: 59 | Admitting: Internal Medicine

## 2019-01-22 ENCOUNTER — Encounter: Payer: Self-pay | Admitting: Internal Medicine

## 2019-01-22 VITALS — BP 120/82 | HR 66 | Temp 98.6°F | Ht 60.0 in | Wt 186.6 lb

## 2019-01-22 DIAGNOSIS — I10 Essential (primary) hypertension: Secondary | ICD-10-CM | POA: Diagnosis not present

## 2019-01-22 DIAGNOSIS — R05 Cough: Secondary | ICD-10-CM | POA: Diagnosis not present

## 2019-01-22 DIAGNOSIS — R059 Cough, unspecified: Secondary | ICD-10-CM

## 2019-01-22 MED ORDER — HYDROCODONE-HOMATROPINE 5-1.5 MG/5ML PO SYRP: 5.0000 mL | ORAL_SOLUTION | Freq: Every day | ORAL | 0 refills | Status: DC | PRN

## 2019-01-22 MED ORDER — AZITHROMYCIN 250 MG PO TABS
ORAL_TABLET | ORAL | 0 refills | Status: DC
Start: 2019-01-22 — End: 2019-02-04

## 2019-01-22 MED ORDER — LOSARTAN POTASSIUM 50 MG PO TABS: 50.0000 mg | ORAL_TABLET | Freq: Every day | ORAL | 3 refills | Status: DC

## 2019-01-22 NOTE — Progress Notes (Signed)
Chief Complaint  Patient presents with  . Cough    started 1 month ago, pt is c/o throat getting congestion, tickle in throat,dry cough, runny nose.No headaches, no body aches or cold chills, no fever.    1 month cough  Cough dry x 1 month worse during the day she reports she was sick 11/2018 and grandkids sick and 1 week later better with otc cough meds but now when talking or hot or excited cough is worse no sob/no wheezing no sx's GERD   HTN controlled on norvasc 5 mg qd lotensin 40 mg qd, hctz 12.5 mg qd   Review of Systems  Constitutional: Negative for weight loss.  HENT: Negative for hearing loss.   Eyes: Negative for blurred vision.  Respiratory: Positive for cough. Negative for sputum production, shortness of breath and wheezing.   Cardiovascular: Negative for chest pain.  Gastrointestinal: Negative for constipation.  Skin: Negative for rash.   Past Medical History:  Diagnosis Date  . Breast discharge    LEFT BREAST -- Milky and Clear  . Diverticulosis   . Heart murmur   . Hyperlipidemia   . Hypertension   . Migraines   . Osteoporosis    Past Surgical History:  Procedure Laterality Date  . ABDOMINAL HYSTERECTOMY     dub 2008/2009 h/o abnormal pap   . BACK SURGERY     cervical spine [place]  . CESAREAN SECTION     x2   . COLONOSCOPY WITH PROPOFOL N/A 04/11/2018   Procedure: COLONOSCOPY WITH PROPOFOL;  Surgeon: Jonathon Bellows, MD;  Location: [facility] ENDOSCOPY;  Service: Gastroenterology;  Laterality: N/A;  . COLONOSCOPY WITH PROPOFOL N/A 06/20/2018   Procedure: COLONOSCOPY WITH PROPOFOL;  Surgeon: Jonathon Bellows, MD;  Location: [facility] ENDOSCOPY;  Service: Gastroenterology;  Laterality: N/A;   Family History  Problem Relation Age of Onset  . Hypertension Mother   . AAA (abdominal aortic aneurysm) Mother   . Aneurysm Mother   . Cancer Brother        Colon  . Hypertension Brother   . Diabetes Daughter        type 1   . Asthma Sister   . Cancer Brother        colon  cancer   . AAA (abdominal aortic aneurysm) Brother   . Anuerysm Brother   . Cancer Maternal Aunt        Breast  . Breast cancer Maternal Aunt   . Cancer Paternal Aunt        Breast  . Breast cancer Paternal Aunt    Social History   Socioeconomic History  . Marital status: Married    Spouse name: Not on file  . Number of children: Not on file  . Years of education: Not on file  . Highest education level: Not on file  Occupational History  . Not on file  Social Needs  . Financial resource strain: Not on file  . Food insecurity:    Worry: Not on file    Inability: Not on file  . Transportation needs:    Medical: Not on file    Non-medical: Not on file  Tobacco Use  . Smoking status: Never Smoker  . Smokeless tobacco: Never Used  Substance and Sexual Activity  . Alcohol use: No  . Drug use: No  . Sexual activity: Not on file  Lifestyle  . Physical activity:    Days per week: Not on file    Minutes per session: Not on file  .  Stress: Not on file  Relationships  . Social connections:    Talks on phone: Not on file    Gets together: Not on file    Attends religious service: Not on file    Active member of club or organization: Not on file    Attends meetings of clubs or organizations: Not on file    Relationship status: Not on file  . Intimate partner violence:    Fear of current or ex partner: Not on file    Emotionally abused: Not on file    Physically abused: Not on file    Forced sexual activity: Not on file  Other Topics Concern  . 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 amLODipine (NORVASC) 5 MG tablet Take 1 tablet (5 mg total) by mouth daily.  . benazepril (LOTENSIN) 40 MG tablet Take 1 tablet (40 mg total) by mouth daily.  . hydrochlorothiazide (HYDRODIURIL) 12.5 MG tablet Take 1  tablet (12.5 mg total) by mouth daily.   Allergies  Allergen Reactions  . Fosamax [Alendronate Sodium]     GI upset    No results found for this or any previous visit (from the past 2160 hour(s)). Objective  Body mass index is 36.44 kg/m. Wt Readings from Last 3 Encounters:  01/22/19 186 lb 9.6 oz (84.6 kg)  11/20/18 182 lb 9.6 oz (82.8 kg)  10/09/18 180 lb 12.8 oz (82 kg)   Temp Readings from Last 3 Encounters:  01/22/19 98.6 F (37 C) (Oral)  11/20/18 97.9 F (36.6 C) (Oral)  10/09/18 98.5 F (36.9 C) (Oral)   BP Readings from Last 3 Encounters:  01/22/19 120/82  11/20/18 130/72  10/09/18 120/78   Pulse Readings from Last 3 Encounters:  01/22/19 66  11/20/18 66  10/09/18 74    Physical Exam Vitals signs and nursing note reviewed.  Constitutional:      Appearance: Normal appearance. She is well-developed, well-groomed and normal weight.  HENT:     Head: Normocephalic and atraumatic.     Nose: Nose normal.     Mouth/Throat:     Mouth: Mucous membranes are moist.     Pharynx: Oropharynx is clear.  Eyes:     Conjunctiva/sclera: Conjunctivae normal.     Pupils: Pupils are equal, round, and reactive to light.  Cardiovascular:     Rate and Rhythm: Normal rate and regular rhythm.     Heart sounds: Normal heart sounds.  Pulmonary:     Effort: Pulmonary effort is normal.     Breath sounds: Normal breath sounds.  Skin:    General: Skin is warm and dry.  Neurological:     General: No focal deficit present.     Mental Status: She is alert and oriented to person, place, and time. Mental status is at baseline.     Gait: Gait normal.  Psychiatric:        Attention and Perception: Attention and perception normal.        Mood and Affect: Mood and affect normal.        Speech: Speech normal.        Behavior: Behavior normal. Behavior is cooperative.        Thought Content: Thought content normal.  Cognition and Memory: Cognition and memory normal.         Judgment: Judgment normal.     Assessment   1. Cough ddx medication ACEI benazepril, no h/o asthma/allergies not smoker could be residual URI from 11/2018  2. HTN 3. HM Plan   1. Change benazepril to losartan  Consider add GERD medication if not better  Hycodan and Zpack for residual sx's if URI related with sick contacts  otc robitussin DM/mucinex dm  Call back in 1-2 weeks if not bette r 2. See changes above d/c benazepril 40 mg qd change to losartan 50 mg qd  3.  Never had flu shot Tdap utd Hep c neg  Hep B vaccines 3/3 upcoming labs check immunity  MMR immune   mammoneg 06/19/18  DEXA 06/17/17 +osteoporosisdue again 03/04/2019  -d/c fosamax today try to get prolia approved   05/01/17 pap neg neg HPV s/p hysterectomy and h/o abnormal pap sees Dr. [facility]itt [facility]   [facility] EGD and colonoscopy had <50 and >5 years need to get records. -With [place] 2 brothers colon cancer refer to Dr. Cherlyn Labella has appt colonoscopy4/12/19 Dr. Audria Nine MDs -colonoscopy had 04/11/18 polyp and repeat 06/20/18 polpy +tubular adenoma given size GI  GI rec repeat in 3 years  rec D3 2000 IU qd   Provider: Dr. Olivia Mackie McLean-Scocuzza-Internal Medicine

## 2019-01-22 NOTE — Patient Instructions (Addendum)
Robitussin DM or Mucinex DM green label  Hycodan  Zpack for cough  Cough drops with hone and lemon  Tea with honey and lemon   Cough, Adult  Coughing is a reflex that clears your throat and your airways. Coughing helps to heal and protect your lungs. It is normal to cough occasionally, but a cough that happens with other symptoms or lasts a long time may be a sign of a condition that needs treatment. A cough may last only 2-3 weeks (acute), or it may last longer than 8 weeks (chronic). What are the causes? Coughing is commonly caused by:  Breathing in substances that irritate your lungs.  A viral or bacterial respiratory infection.  Allergies.  Asthma.  Postnasal drip.  Smoking.  Acid backing up from the stomach into the esophagus (gastroesophageal reflux).  Certain medicines. I.e Benazepril   Chronic lung problems, including COPD (or rarely, lung cancer).  Other medical conditions such as heart failure. Follow these instructions at home: Pay attention to any changes in your symptoms. Take these actions to help with your discomfort:  Take medicines only as told by your health care provider. ? If you were prescribed an antibiotic medicine, take it as told by your health care provider. Do not stop taking the antibiotic even if you start to feel better. ? Talk with your health care provider before you take a cough suppressant medicine.  Drink enough fluid to keep your urine clear or pale yellow.  If the air is dry, use a cold steam vaporizer or humidifier in your bedroom or your home to help loosen secretions.  Avoid anything that causes you to cough at work or at home.  If your cough is worse at night, try sleeping in a semi-upright position.  Avoid cigarette smoke. If you smoke, quit smoking. If you need help quitting, ask your health care provider.  Avoid caffeine.  Avoid alcohol.  Rest as needed. Contact a health care provider if:  You have new symptoms.  You  cough up pus.  Your cough does not get better after 2-3 weeks, or your cough gets worse.  You cannot control your cough with suppressant medicines and you are losing sleep.  You develop pain that is getting worse or pain that is not controlled with pain medicines.  You have a fever.  You have unexplained weight loss.  You have night sweats. Get help right away if:  You cough up blood.  You have difficulty breathing.  Your heartbeat is very fast. This information is not intended to replace advice given to you by your health care provider. Make sure you discuss any questions you have with your health care provider. Document Released: 06/15/2011 Document Revised: 05/24/2016 Document Reviewed: 02/23/2015 Elsevier Interactive Patient Education  2019 [employer].

## 2019-01-27 NOTE — Progress Notes (Signed)
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

## 2019-02-04 ENCOUNTER — Encounter: Payer: Self-pay | Admitting: Internal Medicine

## 2019-02-04 ENCOUNTER — Ambulatory Visit: Payer: 59 | Admitting: Internal Medicine

## 2019-02-04 VITALS — BP 138/82 | HR 89 | Temp 97.6°F | Ht 60.0 in | Wt 185.4 lb

## 2019-02-04 DIAGNOSIS — J309 Allergic rhinitis, unspecified: Secondary | ICD-10-CM | POA: Diagnosis not present

## 2019-02-04 DIAGNOSIS — R6889 Other general symptoms and signs: Secondary | ICD-10-CM

## 2019-02-04 DIAGNOSIS — I10 Essential (primary) hypertension: Secondary | ICD-10-CM

## 2019-02-04 DIAGNOSIS — R0982 Postnasal drip: Secondary | ICD-10-CM | POA: Diagnosis not present

## 2019-02-04 DIAGNOSIS — R0989 Other specified symptoms and signs involving the circulatory and respiratory systems: Secondary | ICD-10-CM

## 2019-02-04 MED ORDER — CETIRIZINE HCL 10 MG PO TABS: 10.0000 mg | ORAL_TABLET | Freq: Every day | ORAL | 3 refills | Status: DC

## 2019-02-04 MED ORDER — IPRATROPIUM BROMIDE 0.06 % NA SOLN: 2.0000 | Freq: Three times a day (TID) | NASAL | 12 refills | Status: DC

## 2019-02-04 NOTE — Patient Instructions (Addendum)
Call back in 1 week  If nose spray and allergy meds dont help will will consider reflux medications and ENT referral   Allergies, Adult An allergy is when your body's defense system (immune system) overreacts to an otherwise harmless substance (allergen) that you breathe in or eat or something that touches your skin. When you come into contact with something that you are allergic to, your immune system produces certain proteins (antibodies). These proteins cause cells to release chemicals (histamines) that trigger the symptoms of an allergic reaction. Allergies often affect the nasal passages (allergic rhinitis), eyes (allergic conjunctivitis), skin (atopic dermatitis), and stomach. Allergies can be mild or severe. Allergies cannot spread from person to person (are not contagious). They can develop at any age and may be outgrown. What increases the risk? You may be at greater risk of allergies if other people in your family have allergies. What are the signs or symptoms? Symptoms depend on what type of allergy you have. They may include:  Runny, stuffy nose.  Sneezing.  Itchy mouth, ears, or throat.  Postnasal drip.  Sore throat.  Itchy, red, watery, or puffy eyes.  Skin rash or hives.  Stomach pain.  Vomiting.  Diarrhea.  Bloating.  Wheezing or coughing. People with a severe allergy to food, medicine, or an insect bite may have a life-threatening allergic reaction (anaphylaxis). Symptoms of anaphylaxis include:  Hives.  Itching.  Flushed face.  Swollen lips, tongue, or mouth.  Tight or swollen throat.  Chest pain or tightness in the chest.  Trouble breathing or shortness of breath.  Rapid heartbeat.  Dizziness or fainting.  Vomiting.  Diarrhea.  Pain in the abdomen. How is this diagnosed? This condition is diagnosed based on:  Your symptoms.  Your family and medical history.  A physical exam. You may need to see a health care provider who  specializes in treating allergies (allergist). You may also have tests, including:  Skin tests to see which allergens are causing your symptoms, such as: ? Skin prick test. In this test, your skin is pricked with a tiny needle and exposed to small amounts of possible allergens to see if your skin reacts. ? Intradermal skin test. In this test, a small amount of allergen is injected under your skin to see if your skin reacts. ? Patch test. In this test, a small amount of allergen is placed on your skin and then your skin is covered with a bandage. Your health care provider will check your skin after a couple of days to see if a rash has developed.  Blood tests.  Challenges tests. In this test, you inhale a small amount of allergen by mouth to see if you have an allergic reaction. You may also be asked to:  Keep a food diary. A food diary is a record of all the foods and drinks you have in a day and any symptoms you experience.  Practice an elimination diet. An elimination diet involves eliminating specific foods from your diet and then adding them back in one by one to find out if a certain food causes an allergic reaction. How is this treated? Treatment for allergies depends on your symptoms. Treatment may include:  Cold compresses to soothe itching and swelling.  Eye drops.  Nasal sprays.  Using a saline spray or container (neti pot) to flush out the nose (nasal irrigation). These methods can help clear away mucus and keep the nasal passages moist.  Using a humidifier.  Oral antihistamines or other  medicines to block allergic reaction and inflammation.  Skin creams to treat rashes or itching.  Diet changes to eliminate food allergy triggers.  Repeated exposure to tiny amounts of allergens to build up a tolerance and prevent future allergic reactions (immunotherapy). These include: ? Allergy shots. ? Oral treatment. This involves taking small doses of an allergen under the tongue  (sublingual immunotherapy).  Emergency epinephrine injection (auto-injector) in case of an allergic emergency. This is a self-injectable, pre-measured medicine that must be given within the first few minutes of a serious allergic reaction. Follow these instructions at home:         Avoid known allergens whenever possible.  If you suffer from airborne allergens, wash out your nose daily. You can do this with a saline spray or a neti pot to flush out your nose (nasal irrigation).  Take over-the-counter and prescription medicines only as told by your health care provider.  Keep all follow-up visits as told by your health care provider. This is important.  If you are at risk of a severe allergic reaction (anaphylaxis), keep your auto-injector with you at all times.  If you have ever had anaphylaxis, wear a medical alert bracelet or necklace that states you have a severe allergy. Contact a health care provider if:  Your symptoms do not improve with treatment. Get help right away if:  You have symptoms of anaphylaxis, such as: ? Swollen mouth, tongue, or throat. ? Pain or tightness in your chest. ? Trouble breathing or shortness of breath. ? Dizziness or fainting. ? Severe abdominal pain, vomiting, or diarrhea. This information is not intended to replace advice given to you by your health care provider. Make sure you discuss any questions you have with your health care provider. Document Released: 03/12/2003 Document Revised: 04/17/2017 Document Reviewed: 07/04/2016 Elsevier Interactive Patient Education  2019 [employer].

## 2019-02-04 NOTE — Progress Notes (Addendum)
Chief Complaint  Patient presents with  . Acute Visit    constant clearing of throat   F/u  1. Throat clearing constantly and possibly postnasal drip and right upper tooth pain with partial dental appt 01/2019. Nose does run clear drainage at times. Throat clearing is loud and that is how she gets relief and also tried carbonated beverage which helps. She is not aware she has allergies, no sx's of GERD never had this before we changed benazepril to losartan and this has not helped. Denies dysphagia     Review of Systems  Constitutional: Negative for weight loss.  HENT:       +pnd +right upper tooth ? Dental pain    Eyes: Negative for blurred vision.  Respiratory: Negative for shortness of breath.   Cardiovascular: Negative for chest pain.  Gastrointestinal: Negative for heartburn.   Past Medical History:  Diagnosis Date  . Breast discharge    LEFT BREAST -- Milky and Clear  . Diverticulosis   . Heart murmur   . Hyperlipidemia   . Hypertension   . Migraines   . Osteoporosis    Past Surgical History:  Procedure Laterality Date  . ABDOMINAL HYSTERECTOMY     dub 2008/2009 h/o abnormal pap   . BACK SURGERY     cervical spine [place]  . CESAREAN SECTION     x2   . COLONOSCOPY WITH PROPOFOL N/A 04/11/2018   Procedure: COLONOSCOPY WITH PROPOFOL;  Surgeon: Jonathon Bellows, MD;  Location: [facility] ENDOSCOPY;  Service: Gastroenterology;  Laterality: N/A;  . COLONOSCOPY WITH PROPOFOL N/A 06/20/2018   Procedure: COLONOSCOPY WITH PROPOFOL;  Surgeon: Jonathon Bellows, MD;  Location: [facility] ENDOSCOPY;  Service: Gastroenterology;  Laterality: N/A;   Family History  Problem Relation Age of Onset  . Hypertension Mother   . AAA (abdominal aortic aneurysm) Mother   . Aneurysm Mother   . Cancer Brother        Colon  . Hypertension Brother   . Diabetes Daughter        type 1   . Asthma Sister   . Cancer Brother        colon cancer   . AAA (abdominal aortic aneurysm) Brother   . Anuerysm Brother    . Cancer Maternal Aunt        Breast  . Breast cancer Maternal Aunt   . Cancer Paternal Aunt        Breast  . Breast cancer Paternal Aunt    Social History   Socioeconomic History  . Marital status: Married    Spouse name: Not on file  . Number of children: Not on file  . Years of education: Not on file  . Highest education level: Not on file  Occupational History  . Not on file  Social Needs  . Financial resource strain: Not on file  . Food insecurity:    Worry: Not on file    Inability: Not on file  . Transportation needs:    Medical: Not on file    Non-medical: Not on file  Tobacco Use  . Smoking status: Never Smoker  . Smokeless tobacco: Never Used  Substance and Sexual Activity  . Alcohol use: No  . Drug use: No  . Sexual activity: Not on file  Lifestyle  . Physical activity:    Days per week: Not on file    Minutes per session: Not on file  . Stress: Not on file  Relationships  . Social connections:  Talks on phone: Not on file    Gets together: Not on file    Attends religious service: Not on file    Active member of club or organization: Not on file    Attends meetings of clubs or organizations: Not on file    Relationship status: Not on file  . Intimate partner violence:    Fear of current or ex partner: Not on file    Emotionally abused: Not on file    Physically abused: Not on file    Forced sexual activity: Not on file  Other Topics Concern  . 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 amLODipine (NORVASC) 5 MG tablet Take 1 tablet (5 mg total) by mouth daily.  . hydrochlorothiazide (HYDRODIURIL) 12.5 MG tablet Take 1 tablet (12.5 mg total) by mouth daily.  Marland Kitchen HYDROcodone-homatropine (HYCODAN) 5-1.5 MG/5ML syrup Take 5 mLs by mouth daily as needed for cough.  . losartan  (COZAAR) 50 MG tablet Take 1 tablet (50 mg total) by mouth daily.   Allergies  Allergen Reactions  . Fosamax [Alendronate Sodium]     GI upset    No results found for this or any previous visit (from the past 2160 hour(s)). Objective  Body mass index is 36.21 kg/m. Wt Readings from Last 3 Encounters:  02/04/19 185 lb 6.4 oz (84.1 kg)  01/22/19 186 lb 9.6 oz (84.6 kg)  11/20/18 182 lb 9.6 oz (82.8 kg)   Temp Readings from Last 3 Encounters:  02/04/19 97.6 F (36.4 C) (Oral)  01/22/19 98.6 F (37 C) (Oral)  11/20/18 97.9 F (36.6 C) (Oral)   BP Readings from Last 3 Encounters:  02/04/19 138/82  01/22/19 120/82  11/20/18 130/72   Pulse Readings from Last 3 Encounters:  02/04/19 89  01/22/19 66  11/20/18 66    Physical Exam Vitals signs and nursing note reviewed.  Constitutional:      Appearance: Normal appearance. She is well-developed and well-groomed.  HENT:     Head: Normocephalic and atraumatic.     Nose: Nose normal.     Mouth/Throat:     Mouth: Mucous membranes are moist.     Pharynx: Oropharynx is clear.  Eyes:     Conjunctiva/sclera: Conjunctivae normal.     Pupils: Pupils are equal, round, and reactive to light.  Cardiovascular:     Rate and Rhythm: Normal rate and regular rhythm.     Heart sounds: Normal heart sounds.  Pulmonary:     Effort: Pulmonary effort is normal.     Breath sounds: Normal breath sounds.  Skin:    General: Skin is warm and dry.  Neurological:     General: No focal deficit present.     Mental Status: She is alert and oriented to person, place, and time. Mental status is at baseline.     Gait: Gait normal.  Psychiatric:        Attention and Perception: Attention and perception normal.        Mood and Affect: Mood and affect normal.        Speech: Speech normal.        Behavior: Behavior normal. Behavior is cooperative.        Thought Content: Thought content normal.  Cognition and Memory: Cognition and memory normal.         Judgment: Judgment normal.     Assessment   1. Throat clearing ddx PND/allergies vs GERD vs other doubt she is having sinus issues 2. HM 3. HTN elevated intially 146/90 repeat 138/82  Plan   1. Trial of zyrtec qhs and atrovent during the day  If not better consider PPI and if not better consider referral ENT further w/u I.e laryngoscopy to r/o LPR 2.  Never had flu shot Tdaputd Hep c neg Hep B vaccines 3/3upcoming labs check immunity  MMR immune   mammoneg 06/19/18  DEXA 06/17/17 +osteoporosisdue again 03/04/2019 -d/c fosamax today try to get prolia approved  05/01/17 pap neg neg HPV s/p hysterectomy and h/o abnormal pap sees Dr. [facility]   [facility]GD and colonoscopy had <50 and >5 years need to get records. -With [place] 2 brothers colon cancer refer to Dr. Cherlyn Labella has appt colonoscopy4/12/19 Dr. Audria Nine MDs -colonoscopy had 04/11/18 polyp and repeat 06/20/18 polpy +tubular adenoma given size GI [place] [facility] rec repeat in 3 years  rec D3 2000 IU qd 3. Monitor on losartan 50, hctz 12.5 mg qd   Provider: Dr. Olivia Mackie McLean-Scocuzza-Internal Medicine

## 2019-02-04 NOTE — Progress Notes (Signed)
Pre visit review using our clinic review tool, if applicable. No additional management support is needed unless otherwise documented below in the visit note. 

## 2019-03-25 ENCOUNTER — Ambulatory Visit: Payer: 59 | Admitting: Internal Medicine

## 2019-07-01 ENCOUNTER — Telehealth: Payer: Self-pay | Admitting: Internal Medicine

## 2019-07-01 NOTE — Telephone Encounter (Signed)
Patient has been approved for Prolia her co-pay will 40% of cost of medication.and injection fee charge.

## 2019-07-21 NOTE — Telephone Encounter (Signed)
Prolia has been approved for patient, called to advise patient and schedule first injection. Left message to return call to office.

## 2019-09-21 DIAGNOSIS — M722 Plantar fascial fibromatosis: Secondary | ICD-10-CM | POA: Diagnosis not present

## 2019-10-06 DIAGNOSIS — M722 Plantar fascial fibromatosis: Secondary | ICD-10-CM | POA: Diagnosis not present

## 2019-10-06 DIAGNOSIS — M79672 Pain in left foot: Secondary | ICD-10-CM | POA: Diagnosis not present

## 2019-10-06 DIAGNOSIS — M79671 Pain in right foot: Secondary | ICD-10-CM | POA: Diagnosis not present

## 2019-10-26 NOTE — Telephone Encounter (Signed)
Left second message to call office to schedule Prolia.

## 2019-12-14 ENCOUNTER — Telehealth: Payer: Self-pay

## 2019-12-14 ENCOUNTER — Other Ambulatory Visit: Payer: Self-pay | Admitting: Internal Medicine

## 2019-12-14 DIAGNOSIS — J309 Allergic rhinitis, unspecified: Secondary | ICD-10-CM

## 2019-12-14 MED ORDER — IPRATROPIUM BROMIDE 0.06 % NA SOLN: 2.0000 | Freq: Three times a day (TID) | NASAL | 12 refills | Status: DC

## 2019-12-14 NOTE — Telephone Encounter (Signed)
Call pt  Is it post nasal?   She can try otc allergy pill zyrtec, xyzal, claritin or allegra   Does she have allergies?   We could also try Atrovent if she wants to dry her nose up.  -does she?   Dale

## 2019-12-14 NOTE — Telephone Encounter (Signed)
Copied from [place] [id number]. Topic: General - Other >> Dec 14, 2019 11:45 AM Rainey Pines A wrote: Patient stated that she would like to speak nurse in regards to her nasal drainage . Please advise

## 2019-12-14 NOTE — Telephone Encounter (Signed)
Patient stated she does have PND. No allergies. She is willing to try atrovent.

## 2019-12-14 NOTE — Telephone Encounter (Signed)
She has on list resent to pharmacy if this doesn't work call back will consider singulair if this doesn't work ENT

## 2019-12-19 DIAGNOSIS — Z20828 Contact with and (suspected) exposure to other viral communicable diseases: Secondary | ICD-10-CM | POA: Diagnosis not present

## 2019-12-19 DIAGNOSIS — U071 COVID-19: Secondary | ICD-10-CM | POA: Diagnosis not present

## 2020-01-20 ENCOUNTER — Other Ambulatory Visit: Payer: Self-pay | Admitting: Internal Medicine

## 2020-01-20 ENCOUNTER — Telehealth: Payer: Self-pay | Admitting: Internal Medicine

## 2020-01-20 DIAGNOSIS — I10 Essential (primary) hypertension: Secondary | ICD-10-CM

## 2020-01-20 MED ORDER — HYDROCHLOROTHIAZIDE 12.5 MG PO TABS: 12.5000 mg | ORAL_TABLET | Freq: Every day | ORAL | 3 refills | Status: DC

## 2020-01-20 MED ORDER — LOSARTAN POTASSIUM 50 MG PO TABS: 50.0000 mg | ORAL_TABLET | Freq: Every day | ORAL | 3 refills | Status: DC

## 2020-01-20 MED ORDER — AMLODIPINE BESYLATE 5 MG PO TABS: 5.0000 mg | ORAL_TABLET | Freq: Every day | ORAL | 3 refills | Status: DC

## 2020-01-20 NOTE — Telephone Encounter (Signed)
Medication has been refilled.

## 2020-01-20 NOTE — Telephone Encounter (Signed)
Patient is requesting a refill on herlosartan (COZAAR) 50 MG tablet, amLODipine (NORVASC) 5 MG tablet, and hydrochlorothiazide (HYDRODIURIL) 12.5 MG tablet. Send to [facility].

## 2020-02-03 DIAGNOSIS — H524 Presbyopia: Secondary | ICD-10-CM | POA: Diagnosis not present

## 2020-02-10 ENCOUNTER — Ambulatory Visit
Admission: RE | Admit: 2020-02-10 | Discharge: 2020-02-10 | Disposition: A | Discharge status: Home / Self Care | Payer: 59 | Source: Ambulatory Visit | Attending: Internal Medicine | Admitting: Internal Medicine

## 2020-02-10 DIAGNOSIS — Z1231 Encounter for screening mammogram for malignant neoplasm of breast: Secondary | ICD-10-CM | POA: Diagnosis not present

## 2020-03-05 DIAGNOSIS — Z23 Encounter for immunization: Secondary | ICD-10-CM | POA: Diagnosis not present

## 2020-03-26 DIAGNOSIS — Z23 Encounter for immunization: Secondary | ICD-10-CM | POA: Diagnosis not present

## 2020-05-25 DIAGNOSIS — R2231 Localized swelling, mass and lump, right upper limb: Secondary | ICD-10-CM | POA: Diagnosis not present

## 2020-07-09 DIAGNOSIS — M5442 Lumbago with sciatica, left side: Secondary | ICD-10-CM | POA: Diagnosis not present

## 2020-07-09 DIAGNOSIS — M545 Low back pain: Secondary | ICD-10-CM | POA: Diagnosis not present

## 2020-07-14 ENCOUNTER — Encounter: Payer: Self-pay | Admitting: Internal Medicine

## 2020-07-14 ENCOUNTER — Ambulatory Visit: Payer: 59 | Admitting: Internal Medicine

## 2020-07-14 ENCOUNTER — Other Ambulatory Visit: Payer: Self-pay

## 2020-07-14 VITALS — BP 140/84 | HR 71 | Temp 97.4°F | Ht 60.0 in | Wt 192.6 lb

## 2020-07-14 DIAGNOSIS — Z0184 Encounter for antibody response examination: Secondary | ICD-10-CM | POA: Diagnosis not present

## 2020-07-14 DIAGNOSIS — E669 Obesity, unspecified: Secondary | ICD-10-CM | POA: Diagnosis not present

## 2020-07-14 DIAGNOSIS — E559 Vitamin D deficiency, unspecified: Secondary | ICD-10-CM | POA: Diagnosis not present

## 2020-07-14 DIAGNOSIS — Z1159 Encounter for screening for other viral diseases: Secondary | ICD-10-CM

## 2020-07-14 DIAGNOSIS — R7303 Prediabetes: Secondary | ICD-10-CM

## 2020-07-14 DIAGNOSIS — R29898 Other symptoms and signs involving the musculoskeletal system: Secondary | ICD-10-CM | POA: Diagnosis not present

## 2020-07-14 DIAGNOSIS — M5432 Sciatica, left side: Secondary | ICD-10-CM | POA: Diagnosis not present

## 2020-07-14 DIAGNOSIS — M5416 Radiculopathy, lumbar region: Secondary | ICD-10-CM

## 2020-07-14 DIAGNOSIS — M5442 Lumbago with sciatica, left side: Secondary | ICD-10-CM

## 2020-07-14 DIAGNOSIS — R200 Anesthesia of skin: Secondary | ICD-10-CM | POA: Diagnosis not present

## 2020-07-14 DIAGNOSIS — R202 Paresthesia of skin: Secondary | ICD-10-CM

## 2020-07-14 MED ORDER — PREDNISONE 20 MG PO TABS: 40.0000 mg | ORAL_TABLET | Freq: Every day | ORAL | 0 refills | Status: DC

## 2020-07-14 MED ORDER — CYCLOBENZAPRINE HCL 5 MG PO TABS: 5.0000 mg | ORAL_TABLET | Freq: Two times a day (BID) | ORAL | 0 refills | Status: DC | PRN

## 2020-07-14 MED ORDER — METHYLPREDNISOLONE ACETATE 40 MG/ML IJ SUSP
80.0000 mg | Freq: Once | INTRAMUSCULAR | Status: AC
  Administered 2020-07-14: 80 mg via INTRAMUSCULAR

## 2020-07-14 NOTE — Progress Notes (Signed)
Patient presenting with lower back pain with left sided leg pain. States this started last Friday and continued in to Saturday morning. She was seen by [facility] and they performed a x-ray. She was diagnosed with sciatica pain and given Hydrocodone, metaxalone, and naproxen. States this is not helping and pain is getting worse.  Pain rated 8/10.   Patient flagged: Current status:  PATIENT IS OVERDUE FOR BMI FOLLOW UP PLAN BMI is estimated to be 37.6 based on the last recorded weight and height

## 2020-07-14 NOTE — Patient Instructions (Signed)
Call me tomorrow and let me know if you are better    Sciatica Rehab Ask your health care provider which exercises are safe for you. Do exercises exactly as told by your health care provider and adjust them as directed. It is normal to feel mild stretching, pulling, tightness, or discomfort as you do these exercises. Stop right away if you feel sudden pain or your pain gets worse. Do not begin these exercises until told by your health care provider. Stretching and range-of-motion exercises These exercises warm up your muscles and joints and improve the movement and flexibility of your hips and back. These exercises also help to relieve pain, numbness, and tingling. Sciatic nerve glide 1. Sit in a chair with your head facing down toward your chest. Place your hands behind your back. Let your shoulders slump forward. 2. Slowly straighten one of your legs while you tilt your head back as if you are looking toward the ceiling. Only straighten your leg as far as you can without making your symptoms worse. 3. Hold this position for __________ seconds. 4. Slowly return to the starting position. 5. Repeat with your other leg. Repeat __________ times. Complete this exercise __________ times a day. Knee to chest with hip adduction and internal rotation  1. Lie on your back on a firm surface with both legs straight. 2. Bend one of your knees and move it up toward your chest until you feel a gentle stretch in your lower back and buttock. Then, move your knee toward the shoulder that is on the opposite side from your leg. This is hip adduction and internal rotation. ? Hold your leg in this position by holding on to the front of your knee. 3. Hold this position for __________ seconds. 4. Slowly return to the starting position. 5. Repeat with your other leg. Repeat __________ times. Complete this exercise __________ times a day. Prone extension on elbows  1. Lie on your abdomen on a firm surface. A bed may  be too soft for this exercise. 2. Prop yourself up on your elbows. 3. Use your arms to help lift your chest up until you feel a gentle stretch in your abdomen and your lower back. ? This will place some of your body weight on your elbows. If this is uncomfortable, try stacking pillows under your chest. ? Your hips should stay down, against the surface that you are lying on. Keep your hip and back muscles relaxed. 4. Hold this position for __________ seconds. 5. Slowly relax your upper body and return to the starting position. Repeat __________ times. Complete this exercise __________ times a day. Strengthening exercises These exercises build strength and endurance in your back. Endurance is the ability to use your muscles for a long time, even after they get tired. Pelvic tilt This exercise strengthens the muscles that lie deep in the abdomen. 1. Lie on your back on a firm surface. Bend your knees and keep your feet flat on the floor. 2. Tense your abdominal muscles. Tip your pelvis up toward the ceiling and flatten your lower back into the floor. ? To help with this exercise, you may place a small towel under your lower back and try to push your back into the towel. 3. Hold this position for __________ seconds. 4. Let your muscles relax completely before you repeat this exercise. Repeat __________ times. Complete this exercise __________ times a day. Alternating arm and leg raises  1. Get on your hands and knees on a  firm surface. If you are on a hard floor, you may want to use padding, such as an exercise mat, to cushion your knees. 2. Line up your arms and legs. Your hands should be directly below your shoulders, and your knees should be directly below your hips. 3. Lift your left leg behind you. At the same time, raise your right arm and straighten it in front of you. ? Do not lift your leg higher than your hip. ? Do not lift your arm higher than your shoulder. ? Keep your abdominal and  back muscles tight. ? Keep your hips facing the ground. ? Do not arch your back. ? Keep your balance carefully, and do not hold your breath. 4. Hold this position for __________ seconds. 5. Slowly return to the starting position. 6. Repeat with your right leg and your left arm. Repeat __________ times. Complete this exercise __________ times a day. Posture and body mechanics Good posture and healthy body mechanics can help to relieve stress in your body's tissues and joints. Body mechanics refers to the movements and positions of your body while you do your daily activities. Posture is part of body mechanics. Good posture means:  Your spine is in its natural S-curve position (neutral).  Your shoulders are pulled back slightly.  Your head is not tipped forward. Follow these guidelines to improve your posture and body mechanics in your everyday activities. Standing   When standing, keep your spine neutral and your feet about hip width apart. Keep a slight bend in your knees. Your ears, shoulders, and hips should line up.  When you do a task in which you stand in one place for a long time, place one foot up on a stable object that is 2-4 inches (5-10 cm) high, such as a footstool. This helps keep your spine neutral. Sitting   When sitting, keep your spine neutral and keep your feet flat on the floor. Use a footrest, if necessary, and keep your thighs parallel to the floor. Avoid rounding your shoulders, and avoid tilting your head forward.  When working at a desk or a computer, keep your desk at a height where your hands are slightly lower than your elbows. Slide your chair under your desk so you are close enough to maintain good posture.  When working at a computer, place your monitor at a height where you are looking straight ahead and you do not have to tilt your head forward or downward to look at the screen. Resting  When lying down and resting, avoid positions that are most  painful for you.  If you have pain with activities such as sitting, bending, stooping, or squatting, lie in a position in which your body does not bend very much. For example, avoid curling up on your side with your arms and knees near your chest (fetal position).  If you have pain with activities such as standing for a long time or reaching with your arms, lie with your spine in a neutral position and bend your knees slightly. Try the following positions: ? Lying on your side with a pillow between your knees. ? Lying on your back with a pillow under your knees. Lifting   When lifting objects, keep your feet at least shoulder width apart and tighten your abdominal muscles.  Bend your knees and hips and keep your spine neutral. It is important to lift using the strength of your legs, not your back. Do not lock your knees straight out.  Always ask for help to lift heavy or awkward objects. This information is not intended to replace advice given to you by your health care provider. Make sure you discuss any questions you have with your health care provider. Document Revised: 04/10/2019 Document Reviewed: 01/08/2019 Elsevier Patient Education  [place].  Sciatica  Sciatica is pain, numbness, weakness, or tingling along the path of the sciatic nerve. The sciatic nerve starts in the lower back and runs down the back of each leg. The nerve controls the muscles in the lower leg and in the back of the knee. It also provides feeling (sensation) to the back of the thigh, the lower leg, and the sole of the foot. Sciatica is a symptom of another medical condition that pinches or puts pressure on the sciatic nerve. Sciatica most often only affects one side of the body. Sciatica usually goes away on its own or with treatment. In some cases, sciatica may come back (recur). What are the causes? This condition is caused by pressure on the sciatic nerve or pinching of the nerve. This may be the  result of:  A disk in between the bones of the spine bulging out too far (herniated disk).  Age-related changes in the spinal disks.  A pain disorder that affects a muscle in the buttock.  Extra bone growth near the sciatic nerve.  A break (fracture) of the pelvis.  Pregnancy.  Tumor. This is rare. What increases the risk? The following factors may make you more likely to develop this condition:  Playing sports that place pressure or stress on the spine.  Having poor strength and flexibility.  A history of back injury or surgery.  Sitting for long periods of time.  Doing activities that involve repetitive bending or lifting.  Obesity. What are the signs or symptoms? Symptoms can vary from mild to very severe, and they may include:  Any of these problems in the lower back, leg, hip, or buttock: ? Mild tingling, numbness, or dull aches. ? Burning sensations. ? Sharp pains.  Numbness in the back of the calf or the sole of the foot.  Leg weakness.  Severe back pain that makes movement difficult. Symptoms may get worse when you cough, sneeze, or laugh, or when you sit or stand for long periods of time. How is this diagnosed? This condition may be diagnosed based on:  Your symptoms and medical history.  A physical exam.  Blood tests.  Imaging tests, such as: ? X-rays. ? MRI. ? CT scan. How is this treated? In many cases, this condition improves on its own without treatment. However, treatment may include:  Reducing or modifying physical activity.  Exercising and stretching.  Icing and applying heat to the affected area.  Medicines that help to: ? Relieve pain and swelling. ? Relax your muscles.  Injections of medicines that help to relieve pain, irritation, and inflammation around the sciatic nerve (steroids).  Surgery. Follow these instructions at home: Medicines  Take over-the-counter and prescription medicines only as told by your health care  provider.  Ask your health care provider if the medicine prescribed to you: ? Requires you to avoid driving or using heavy machinery. ? Can cause constipation. You may need to take these actions to prevent or treat constipation:  Drink enough fluid to keep your urine pale yellow.  Take over-the-counter or prescription medicines.  Eat foods that are high in fiber, such as beans, whole grains, and fresh fruits and vegetables.  Limit foods  that are high in fat and processed sugars, such as fried or sweet foods. Managing pain      If directed, put ice on the affected area. ? Put ice in a plastic bag. ? Place a towel between your skin and the bag. ? Leave the ice on for 20 minutes, 2-3 times a day.  If directed, apply heat to the affected area. Use the heat source that your health care provider recommends, such as a moist heat pack or a heating pad. ? Place a towel between your skin and the heat source. ? Leave the heat on for 20-30 minutes. ? Remove the heat if your skin turns bright red. This is especially important if you are unable to feel pain, heat, or cold. You may have a greater risk of getting burned. Activity   Return to your normal activities as told by your health care provider. Ask your health care provider what activities are safe for you.  Avoid activities that make your symptoms worse.  Take brief periods of rest throughout the day. ? When you rest for longer periods, mix in some mild activity or stretching between periods of rest. This will help to prevent stiffness and pain. ? Avoid sitting for long periods of time without moving. Get up and move around at least one time each hour.  Exercise and stretch regularly, as told by your health care provider.  Do not lift anything that is heavier than 10 lb (4.5 kg) while you have symptoms of sciatica. When you do not have symptoms, you should still avoid heavy lifting, especially repetitive heavy lifting.  When you  lift objects, always use proper lifting technique, which includes: ? Bending your knees. ? Keeping the load close to your body. ? Avoiding twisting. General instructions  Maintain a healthy weight. Excess weight puts extra stress on your back.  Wear supportive, comfortable shoes. Avoid wearing high heels.  Avoid sleeping on a mattress that is too soft or too hard. A mattress that is firm enough to support your back when you sleep may help to reduce your pain.  Keep all follow-up visits as told by your health care provider. This is important. Contact a health care provider if:  You have pain that: ? Wakes you up when you are sleeping. ? Gets worse when you lie down. ? Is worse than you have experienced in the past. ? Lasts longer than 4 weeks.  You have an unexplained weight loss. Get help right away if:  You are not able to control when you urinate or have bowel movements (incontinence).  You have: ? Weakness in your lower back, pelvis, buttocks, or legs that gets worse. ? Redness or swelling of your back. ? A burning sensation when you urinate. Summary  Sciatica is pain, numbness, weakness, or tingling along the path of the sciatic nerve.  This condition is caused by pressure on the sciatic nerve or pinching of the nerve.  Sciatica can cause pain, numbness, or tingling in the lower back, legs, hips, and buttocks.  Treatment often includes rest, exercise, medicines, and applying ice or heat. This information is not intended to replace advice given to you by your health care provider. Make sure you discuss any questions you have with your health care provider. Document Revised: 01/05/2019 Document Reviewed: 01/05/2019 Elsevier Patient Education  [place].

## 2020-07-14 NOTE — Progress Notes (Addendum)
Chief Complaint  Patient presents with  . Back Pain   Acute visit  1. C/o lower back pain left sided 8/10 radiating down left leg with left leg numbness/tingling, weakness or feels like going to give out given Skelaxin 800 tid, hydrocodon e5-325 qd prn and naproxen 500 mg prn which is not helping. She feels like left leg if cramping at times. 07/09/20 [place] urgent care visit they also gave her toradol 60 x 1 left side w/o relief. Numbness/tightness is worse in left thigh. Denies abdominal pain  2. She has not been here in a while due to husband had an MI and covid pna 12/25/2019 and she also have covid 19 w/o sx's  Review of Systems  Constitutional: Negative for weight loss.  HENT: Negative for hearing loss.   Eyes: Negative for blurred vision.  Respiratory: Negative for shortness of breath.   Cardiovascular: Negative for chest pain.  Gastrointestinal: Negative for abdominal pain.  Genitourinary: Negative for dysuria.  Musculoskeletal: Positive for back pain.  Skin: Negative for rash.  Neurological: Positive for sensory change and weakness.  Psychiatric/Behavioral: Negative for depression.   Past Medical History:  Diagnosis Date  . COVID-19    12/19/19  . Diverticulosis   . Heart murmur   . Hyperlipidemia   . Hypertension   . Migraines   . Osteoporosis    Past Surgical History:  Procedure Laterality Date  . ABDOMINAL HYSTERECTOMY     dub 2008/2009 h/o abnormal pap   . BACK SURGERY     cervical spine [place]  . CESAREAN SECTION     x2   . COLONOSCOPY WITH PROPOFOL N/A 04/11/2018   Procedure: COLONOSCOPY WITH PROPOFOL;  Surgeon: Jonathon Bellows, MD;  Location: [facility] ENDOSCOPY;  Service: Gastroenterology;  Laterality: N/A;  . COLONOSCOPY WITH PROPOFOL N/A 06/20/2018   Procedure: COLONOSCOPY WITH PROPOFOL;  Surgeon: Jonathon Bellows, MD;  Location: [facility] ENDOSCOPY;  Service: Gastroenterology;  Laterality: N/A;   Family History  Problem Relation Age of Onset  . Hypertension Mother   .  AAA (abdominal aortic aneurysm) Mother   . Aneurysm Mother   . Cancer Brother        Colon  . Hypertension Brother   . Diabetes Daughter        type 1   . Asthma Sister   . Cancer Brother        colon cancer   . AAA (abdominal aortic aneurysm) Brother   . Anuerysm Brother   . Cancer Maternal Aunt        Breast  . Breast cancer Maternal Aunt   . Cancer Paternal Aunt        Breast  . Breast cancer Paternal Aunt   . Breast cancer Paternal Aunt    Social History   Socioeconomic History  . Marital status: Married    Spouse name: Not on file  . Number of children: Not on file  . Years of education: Not on file  . Highest education level: Not on file  Occupational History  . Not on file  Tobacco Use  . Smoking status: Never Smoker  . Smokeless tobacco: Never Used  Substance and Sexual Activity  . Alcohol use: No  . Drug use: No  . Sexual activity: Not on file  Other Topics Concern  . 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 Difficulty of Paying Living Expenses:   Food Insecurity:   . Worried About Charity fundraiser in the Last Year:   . Arboriculturist in the Last Year:   Transportation Needs:   . Film/video editor (Medical):   Marland Kitchen Lack of Transportation (Non-Medical):   Physical Activity:   . Days of Exercise per Week:   . Minutes of Exercise per Session:   Stress:   . Feeling of Stress :   Social Connections:   . Frequency of Communication with Friends and Family:   . Frequency of Social Gatherings with Friends and Family:   . Attends Religious Services:   . Active Member of Clubs or Organizations:   . Attends Archivist Meetings:   Marland Kitchen Marital Status:   Intimate Partner Violence:   . Fear of Current or Ex-Partner:   . Emotionally Abused:   Marland Kitchen  Physically Abused:   . Sexually Abused:    Current Meds  Medication Sig  . amLODipine (NORVASC) 5 MG tablet Take 1 tablet (5 mg total) by mouth daily.  . hydrochlorothiazide (HYDRODIURIL) 12.5 MG tablet Take 1 tablet (12.5 mg total) by mouth daily.  Marland Kitchen HYDROcodone-acetaminophen (NORCO/VICODIN) 5-325 MG tablet Take one tablet at night for pain; may take up to every 6 hours as needed for pain if not working or driving  . losartan (COZAAR) 50 MG tablet Take 1 tablet (50 mg total) by mouth daily.  . naproxen (NAPROSYN) 500 MG tablet Take by mouth.  . [DISCONTINUED] metaxalone (SKELAXIN) 800 MG tablet Take by mouth.   Allergies  Allergen Reactions  . Fosamax [Alendronate Sodium]     GI upset    Recent Results (from the past 2160 hour(s))  Urinalysis, Routine w reflex microscopic     Status: None   Collection Time: 07/14/20  4:41 PM  Result Value Ref Range   Color, Urine YELLOW YELLOW   APPearance CLEAR CLEAR   Specific Gravity, Urine 1.007 1.001 - 1.03   pH 5.5 5.0 - 8.0   Glucose, UA NEGATIVE NEGATIVE   Bilirubin Urine NEGATIVE NEGATIVE   Ketones, ur NEGATIVE NEGATIVE   Hgb urine dipstick NEGATIVE NEGATIVE   Protein, ur NEGATIVE NEGATIVE   Nitrite NEGATIVE NEGATIVE   Leukocytes,Ua NEGATIVE NEGATIVE   Objective  Body mass index is 37.61 kg/m. Wt Readings from Last 3 Encounters:  07/14/20 192 lb 9.6 oz (87.4 kg)  02/04/19 185 lb 6.4 oz (84.1 kg)  01/22/19 186 lb 9.6 oz (84.6 kg)   Temp Readings from Last 3 Encounters:  07/14/20 (!) 97.4 F (36.3 C) (Oral)  02/04/19 97.6 F (36.4 C) (Oral)  01/22/19 98.6 F (37 C) (Oral)   BP Readings from Last 3 Encounters:  07/14/20 140/84  02/04/19 138/82  01/22/19 120/82   Pulse Readings from Last 3 Encounters:  07/14/20 71  02/04/19 89  01/22/19 66    Physical Exam Vitals and nursing note reviewed.  Constitutional:      Appearance: Normal appearance. She is well-developed and well-groomed. She is obese.  HENT:     Head:  Normocephalic and atraumatic.  Eyes:     Conjunctiva/sclera: Conjunctivae normal.     Pupils: Pupils are equal, round, and reactive to light.  Cardiovascular:     Rate and Rhythm: Normal rate and regular rhythm.     Heart sounds: Normal heart sounds.  No murmur heard.   Pulmonary:     Effort: Pulmonary effort is normal.     Breath sounds: Normal breath sounds.  Abdominal:     General: Abdomen is flat. Bowel sounds are normal.     Tenderness: There is no right CVA tenderness or left CVA tenderness.  Musculoskeletal:     Lumbar back: Tenderness present. Positive left straight leg raise test.       Back:  Skin:    General: Skin is warm and dry.  Neurological:     General: No focal deficit present.     Mental Status: She is alert and oriented to person, place, and time. Mental status is at baseline.     Gait: Gait normal.  Psychiatric:        Attention and Perception: Attention and perception normal.        Mood and Affect: Mood and affect normal.        Speech: Speech normal.        Behavior: Behavior normal. Behavior is cooperative.        Thought Content: Thought content normal.        Cognition and Memory: Cognition and memory normal.        Judgment: Judgment normal.     Assessment  Plan  Acute left-sided low back pain with left-sided sciatica with numbness/tingling left leg and left leg weakness  MRI 07/19/20  FINDINGS: Segmentation: For the purposes of this dictation, five lumbar vertebrae are assumed and the caudal most well-formed intervertebral disc is designated L5-S1.  Alignment: Straightening of the expected lumbar lordosis. Trace L2-L3 and L3-L4 retrolisthesis.  Vertebrae: Vertebral body height is maintained. Nonspecific heterogeneous marrow signal without focal suspicious osseous lesion. No significant marrow edema.  Conus medullaris and cauda equina: Conus extends to the L1 level. No signal abnormality within the visualized distal spinal  cord.  Paraspinal and other soft tissues: No abnormality is identified within included portions of the abdomen/retroperitoneum. Paraspinal soft tissues within normal limits.  Disc levels:  Moderate L2-L3 disc degeneration. No more than mild disc degeneration at the remaining levels.  T12-L1: No significant disc herniation or stenosis.  L1-L2: Disc bulge. No significant spinal canal or foraminal stenosis.  L2-L3: Mild grade 1 retrolisthesis. Disc bulge. Mild facet arthrosis. No significant spinal canal or foraminal narrowing.  L3-L4: Mild grade 1 retrolisthesis. Disc bulge. Superimposed shallow left foraminal disc protrusion. Mild facet arthrosis/ligamentum flavum hypertrophy. Minimal left subarticular narrowing without nerve root impingement. Central canal patent. Mild left neural foraminal narrowing.  L4-L5: Mild grade 1 retrolisthesis. Disc uncovering with minimal disc bulge. No significant spinal canal stenosis or neural foraminal narrowing.  L5-S1: Small left foraminal disc protrusion (series 5, image 12) (series 6, image 12). Mild facet arthrosis. No significant spinal canal stenosis. Severe left neural foraminal narrowing with encroachment upon the exiting left L5 nerve root (series 5, image 12).  IMPRESSION: Lumbar spondylosis as outlined and most notably as follows.  At L5-S1, a small left foraminal disc protrusion and facet arthrosis contribute to severe left neural foraminal narrowing with encroachment upon the exiting left L5 nerve root. Correlate for left L5 radiculopathy.  No more than mild spinal canal or neural foraminal narrowing at the remaining levels.  Of note, there is moderate disc degeneration at L2-L3.   - Plan: Urinalysis, Routine w reflex microscopic, predniSONE (DELTASONE) 40 MG tablet qd, methylPREDNISolone acetate (DEPO-MEDROL) injection 80 mg x1, cyclobenzaprine (FLEXERIL) 5 MG tablet bid prn MR Lumbar Spine Wo Contrast  stat  D/c skelaxin 800 tid prn not helping  D/c naproxen not helping  Xray done 07/09/20 [facility] need to get records  Pt called back 07/15/20 still not better with severe pain did not respond to depomedrol 80 will ordered MRI low back  UA done and negative for blood less likely kidney stones   Obesity (BMI 30-39.9)  rec health diet and exercise   HM Tdaputd Pfizer 2/2 Hep c neg Hep B vaccines 3/3upcoming labs check immunity  MMR immune  mammoneg 02/10/20 negordered  DEXA 06/17/17 +osteoporosisdue again 03/04/2019 -d/c fosamax today try to get prolia approvedagain disc at f/u could not tolerate fosamax  05/01/17 pap neg neg HPV s/p hysterectomy and h/o abnormal pap sees Dr. [facility]  -pap at f/u   [facility] EGD and colonoscopy had <50 and >5 years need to get records. -With [place] 2 brothers colon cancer refer to Dr. Cherlyn Labella has appt colonoscopy4/12/19 Dr. Audria Nine MDs -colonoscopy had 04/11/18 polyp and repeat 06/20/18 polpy +tubular adenoma given size [facility] rec repeat in 3 years  rec D3 2000 IU qd Provider: Dr. Olivia Mackie McLean-Scocuzza-[facility]

## 2020-07-15 ENCOUNTER — Telehealth: Payer: Self-pay | Admitting: Internal Medicine

## 2020-07-15 ENCOUNTER — Telehealth: Payer: Self-pay | Admitting: *Deleted

## 2020-07-15 LAB — URINALYSIS, ROUTINE W REFLEX MICROSCOPIC
Bilirubin Urine: NEGATIVE
Glucose, UA: NEGATIVE
Hgb urine dipstick: NEGATIVE
Ketones, ur: NEGATIVE
Leukocytes,Ua: NEGATIVE
Nitrite: NEGATIVE
Protein, ur: NEGATIVE
Specific Gravity, Urine: 1.007 (ref 1.001–1.03)
pH: 5.5 (ref 5.0–8.0)

## 2020-07-15 NOTE — Telephone Encounter (Signed)
Please place future orders for lab appt.  

## 2020-07-15 NOTE — Telephone Encounter (Signed)
Patient calling back in. Was seen yesterday and received an injection Depo Medrol 80. States that she was in a lot of pain all last night and this morning. The shot did not help.   Patient is now wanting the imaging done that was discussed yesterday.

## 2020-07-17 ENCOUNTER — Other Ambulatory Visit: Payer: Self-pay | Admitting: Internal Medicine

## 2020-07-17 DIAGNOSIS — Z1329 Encounter for screening for other suspected endocrine disorder: Secondary | ICD-10-CM

## 2020-07-17 DIAGNOSIS — I10 Essential (primary) hypertension: Secondary | ICD-10-CM

## 2020-07-17 NOTE — Telephone Encounter (Signed)
Ordered stat MRI low back  Thanks TMS

## 2020-07-18 ENCOUNTER — Encounter: Payer: Self-pay | Admitting: Internal Medicine

## 2020-07-18 DIAGNOSIS — E669 Obesity, unspecified: Secondary | ICD-10-CM | POA: Insufficient documentation

## 2020-07-18 DIAGNOSIS — R7303 Prediabetes: Secondary | ICD-10-CM | POA: Insufficient documentation

## 2020-07-18 DIAGNOSIS — M5432 Sciatica, left side: Secondary | ICD-10-CM | POA: Insufficient documentation

## 2020-07-18 NOTE — Telephone Encounter (Signed)
Patient informed and verbalized understanding.  She will await a call. 

## 2020-07-19 ENCOUNTER — Other Ambulatory Visit: Payer: 59

## 2020-07-19 ENCOUNTER — Ambulatory Visit
Admission: RE | Admit: 2020-07-19 | Discharge: 2020-07-19 | Disposition: A | Discharge status: Home / Self Care | Payer: 59 | Source: Ambulatory Visit | Attending: Internal Medicine | Admitting: Internal Medicine

## 2020-07-19 ENCOUNTER — Other Ambulatory Visit: Payer: Self-pay

## 2020-07-19 DIAGNOSIS — M5442 Lumbago with sciatica, left side: Secondary | ICD-10-CM | POA: Diagnosis not present

## 2020-07-19 DIAGNOSIS — M5432 Sciatica, left side: Secondary | ICD-10-CM | POA: Diagnosis not present

## 2020-07-19 DIAGNOSIS — R29898 Other symptoms and signs involving the musculoskeletal system: Secondary | ICD-10-CM

## 2020-07-19 DIAGNOSIS — R202 Paresthesia of skin: Secondary | ICD-10-CM | POA: Insufficient documentation

## 2020-07-19 DIAGNOSIS — R200 Anesthesia of skin: Secondary | ICD-10-CM | POA: Diagnosis not present

## 2020-07-19 DIAGNOSIS — M545 Low back pain: Secondary | ICD-10-CM | POA: Diagnosis not present

## 2020-07-20 ENCOUNTER — Telehealth: Payer: Self-pay | Admitting: Internal Medicine

## 2020-07-20 NOTE — Telephone Encounter (Signed)
Pt wants to know about imaging results. Please advise

## 2020-07-21 NOTE — Telephone Encounter (Signed)
Pt called and states that she couldn't comprehend what you had told her about her MRI results bc she was driving. Please call back

## 2020-07-21 NOTE — Telephone Encounter (Signed)
Patient informed of MRI results. 

## 2020-07-21 NOTE — Telephone Encounter (Signed)
Joyce Glow McLean-Scocuzza, MD  07/19/2020 12:51 PM EDT     Moderate arthritis and some disc bulging and protrusions  Severe left side is pressing on a nerve Does she want to see neurosurgery or have a back injection to help with sx's ?    Went over the above with the patient. Patient verbalized understanding

## 2020-07-24 DIAGNOSIS — M5416 Radiculopathy, lumbar region: Secondary | ICD-10-CM | POA: Insufficient documentation

## 2020-07-24 NOTE — Addendum Note (Signed)
Addended by: Orland Mustard on: 07/24/2020 08:55 PM   Modules accepted: Orders

## 2020-08-02 ENCOUNTER — Ambulatory Visit (INDEPENDENT_AMBULATORY_CARE_PROVIDER_SITE_OTHER): Payer: 59 | Admitting: Internal Medicine

## 2020-08-02 ENCOUNTER — Other Ambulatory Visit: Payer: Self-pay

## 2020-08-02 ENCOUNTER — Encounter: Payer: Self-pay | Admitting: Internal Medicine

## 2020-08-02 ENCOUNTER — Other Ambulatory Visit (HOSPITAL_COMMUNITY)
Admission: RE | Admit: 2020-08-02 | Discharge: 2020-08-02 | Disposition: A | Discharge status: Home / Self Care | Payer: 59 | Source: Ambulatory Visit | Attending: Internal Medicine | Admitting: Internal Medicine

## 2020-08-02 ENCOUNTER — Other Ambulatory Visit: Payer: Self-pay | Admitting: Internal Medicine

## 2020-08-02 VITALS — BP 124/84 | HR 80 | Temp 98.1°F | Ht 61.61 in | Wt 187.8 lb

## 2020-08-02 DIAGNOSIS — Z124 Encounter for screening for malignant neoplasm of cervix: Secondary | ICD-10-CM | POA: Diagnosis not present

## 2020-08-02 DIAGNOSIS — Z0000 Encounter for general adult medical examination without abnormal findings: Secondary | ICD-10-CM

## 2020-08-02 DIAGNOSIS — R937 Abnormal findings on diagnostic imaging of other parts of musculoskeletal system: Secondary | ICD-10-CM | POA: Insufficient documentation

## 2020-08-02 DIAGNOSIS — Z1329 Encounter for screening for other suspected endocrine disorder: Secondary | ICD-10-CM | POA: Diagnosis not present

## 2020-08-02 DIAGNOSIS — I10 Essential (primary) hypertension: Secondary | ICD-10-CM

## 2020-08-02 DIAGNOSIS — E559 Vitamin D deficiency, unspecified: Secondary | ICD-10-CM | POA: Diagnosis not present

## 2020-08-02 DIAGNOSIS — Z1159 Encounter for screening for other viral diseases: Secondary | ICD-10-CM

## 2020-08-02 DIAGNOSIS — Z0184 Encounter for antibody response examination: Secondary | ICD-10-CM

## 2020-08-02 DIAGNOSIS — R7303 Prediabetes: Secondary | ICD-10-CM

## 2020-08-02 DIAGNOSIS — E669 Obesity, unspecified: Secondary | ICD-10-CM

## 2020-08-02 DIAGNOSIS — Z1231 Encounter for screening mammogram for malignant neoplasm of breast: Secondary | ICD-10-CM | POA: Diagnosis not present

## 2020-08-02 DIAGNOSIS — M26609 Unspecified temporomandibular joint disorder, unspecified side: Secondary | ICD-10-CM

## 2020-08-02 LAB — LIPID PANEL
Cholesterol: 196 mg/dL (ref 0–200)
HDL: 60.5 mg/dL (ref >39.00)
LDL Cholesterol: 123 mg/dL — ABNORMAL HIGH (ref 0–99)
NonHDL: 135.33
Total CHOL/HDL Ratio: 3
Triglycerides: 61 mg/dL (ref 0.0–149.0)
VLDL: 12.2 mg/dL (ref 0.0–40.0)

## 2020-08-02 LAB — VITAMIN D 25 HYDROXY (VIT D DEFICIENCY, FRACTURES): VITD: 21.5 ng/mL — ABNORMAL LOW (ref 30.00–100.00)

## 2020-08-02 LAB — CBC WITH DIFFERENTIAL/PLATELET
Basophils Absolute: 0 10*3/uL (ref 0.0–0.1)
Basophils Relative: 0.5 % (ref 0.0–3.0)
Eosinophils Absolute: 0.1 10*3/uL (ref 0.0–0.7)
Eosinophils Relative: 1.2 % (ref 0.0–5.0)
HCT: 40.7 % (ref 36.0–46.0)
Hemoglobin: 14 g/dL (ref 12.0–15.0)
Lymphocytes Relative: 31.6 % (ref 12.0–46.0)
Lymphs Abs: 1.5 10*3/uL (ref 0.7–4.0)
MCHC: 34.4 g/dL (ref 30.0–36.0)
MCV: 83.7 fl (ref 78.0–100.0)
Monocytes Absolute: 0.5 10*3/uL (ref 0.1–1.0)
Monocytes Relative: 10 % (ref 3.0–12.0)
Neutro Abs: 2.7 10*3/uL (ref 1.4–7.7)
Neutrophils Relative %: 56.7 % (ref 43.0–77.0)
Platelets: 239 10*3/uL (ref 150.0–400.0)
RBC: 4.86 Mil/uL (ref 3.87–5.11)
RDW: 13.8 % (ref 11.5–15.5)
WBC: 4.7 10*3/uL (ref 4.0–10.5)

## 2020-08-02 LAB — HEMOGLOBIN A1C: Hgb A1c MFr Bld: 5.6 % (ref 4.6–6.5)

## 2020-08-02 LAB — COMPREHENSIVE METABOLIC PANEL
ALT: 13 U/L (ref 0–35)
AST: 14 U/L (ref 0–37)
Albumin: 4.1 g/dL (ref 3.5–5.2)
Alkaline Phosphatase: 76 U/L (ref 39–117)
BUN: 11 mg/dL (ref 6–23)
CO2: 27 mEq/L (ref 19–32)
Calcium: 9.3 mg/dL (ref 8.4–10.5)
Chloride: 105 mEq/L (ref 96–112)
Creatinine, Ser: 0.86 mg/dL (ref 0.40–1.20)
GFR: 81.56 mL/min (ref >60.00)
Glucose, Bld: 97 mg/dL (ref 70–99)
Potassium: 3.7 mEq/L (ref 3.5–5.1)
Sodium: 138 mEq/L (ref 135–145)
Total Bilirubin: 0.7 mg/dL (ref 0.2–1.2)
Total Protein: 6.9 g/dL (ref 6.0–8.3)

## 2020-08-02 LAB — TSH: TSH: 1.1 u[IU]/mL (ref 0.35–4.50)

## 2020-08-02 MED ORDER — CHOLECALCIFEROL 1.25 MG (50000 UT) PO CAPS: 50000.0000 [IU] | ORAL_CAPSULE | ORAL | 1 refills | Status: DC

## 2020-08-02 MED ORDER — LOSARTAN POTASSIUM-HCTZ 50-12.5 MG PO TABS: 1.0000 | ORAL_TABLET | Freq: Every day | ORAL | 3 refills | Status: DC

## 2020-08-02 NOTE — Progress Notes (Signed)
Chief Complaint  Patient presents with  . Annual Exam   Annual  1. HTN sl elevated on losartan 50 hctz 12.5 mg qd  2. C/o Tmg sx's and jaw locking up at times  3. Abnormal MRI lumbar appt NS 08/04/20 still has pain down left leg and cramping flexeril 5 mg makes her sleepy not sure if working cramps lasting 20-25 minutes    Review of Systems  Constitutional: Negative for weight loss.  HENT: Negative for hearing loss.   Eyes: Negative for blurred vision.  Respiratory: Negative for shortness of breath.   Cardiovascular: Negative for chest pain.  Gastrointestinal: Negative for abdominal pain.  Musculoskeletal: Positive for joint pain.       +leg cramps   Skin: Negative for rash.  Neurological: Negative for headaches.  Psychiatric/Behavioral: Negative for depression.   Past Medical History:  Diagnosis Date  . COVID-19    12/19/19  . Diverticulosis   . Heart murmur   . Hyperlipidemia   . Hypertension   . Migraines   . Osteoporosis    Past Surgical History:  Procedure Laterality Date  . ABDOMINAL HYSTERECTOMY     dub 2008/2009 h/o abnormal pap   . BACK SURGERY     cervical spine [place] NS  . CESAREAN SECTION     x2   . COLONOSCOPY WITH PROPOFOL N/A 04/11/2018   Procedure: COLONOSCOPY WITH PROPOFOL;  Surgeon: Jonathon Bellows, MD;  Location: [facility] ENDOSCOPY;  Service: Gastroenterology;  Laterality: N/A;  . COLONOSCOPY WITH PROPOFOL N/A 06/20/2018   Procedure: COLONOSCOPY WITH PROPOFOL;  Surgeon: Jonathon Bellows, MD;  Location: [facility] ENDOSCOPY;  Service: Gastroenterology;  Laterality: N/A;   Family History  Problem Relation Age of Onset  . Hypertension Mother   . AAA (abdominal aortic aneurysm) Mother   . Aneurysm Mother   . Cancer Brother        Colon  . Hypertension Brother   . Diabetes Daughter        type 1   . Asthma Sister   . Cancer Brother        colon cancer   . AAA (abdominal aortic aneurysm) Brother   . Anuerysm Brother   . Cancer Maternal Aunt        Breast  .  Breast cancer Maternal Aunt   . Cancer Paternal Aunt        Breast  . Breast cancer Paternal Aunt   . Breast cancer Paternal Aunt   . Cancer Other        breast, lung and colon in 3 different cousings   Social History   Socioeconomic History  . Marital status: Married    Spouse name: Not on file  . Number of children: Not on file  . Years of education: Not on file  . Highest education level: Not on file  Occupational History  . Not on file  Tobacco Use  . Smoking status: Never Smoker  . Smokeless tobacco: Never Used  Substance and Sexual Activity  . Alcohol use: No  . Drug use: No  . Sexual activity: Not on file  Other Topics Concern  . 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 Difficulty of Paying Living Expenses:   Food Insecurity:   .  Worried About Charity fundraiser in the Last Year:   . Arboriculturist in the Last Year:   Transportation Needs:   . Film/video editor (Medical):   Marland Kitchen Lack of Transportation (Non-Medical):   Physical Activity:   . Days of Exercise per Week:   . Minutes of Exercise per Session:   Stress:   . Feeling of Stress :   Social Connections:   . Frequency of Communication with Friends and Family:   . Frequency of Social Gatherings with Friends and Family:   . Attends Religious Services:   . Active Member of Clubs or Organizations:   . Attends Archivist Meetings:   Marland Kitchen Marital Status:   Intimate Partner Violence:   . Fear of Current or Ex-Partner:   . Emotionally Abused:   Marland Kitchen Physically Abused:   . Sexually Abused:    Current Meds  Medication Sig  . amLODipine (NORVASC) 5 MG tablet Take 1 tablet (5 mg total) by mouth daily.  . [DISCONTINUED] hydrochlorothiazide (HYDRODIURIL) 12.5 MG tablet Take 1 tablet (12.5 mg total) by  mouth daily.  . [DISCONTINUED] losartan (COZAAR) 50 MG tablet Take 1 tablet (50 mg total) by mouth daily.   Allergies  Allergen Reactions  . Fosamax [Alendronate Sodium]     GI upset    Recent Results (from the past 2160 hour(s))  Urinalysis, Routine w reflex microscopic     Status: None   Collection Time: 07/14/20  4:41 PM  Result Value Ref Range   Color, Urine YELLOW YELLOW   APPearance CLEAR CLEAR   Specific Gravity, Urine 1.007 1.001 - 1.03   pH 5.5 5.0 - 8.0   Glucose, UA NEGATIVE NEGATIVE   Bilirubin Urine NEGATIVE NEGATIVE   Ketones, ur NEGATIVE NEGATIVE   Hgb urine dipstick NEGATIVE NEGATIVE   Protein, ur NEGATIVE NEGATIVE   Nitrite NEGATIVE NEGATIVE   Leukocytes,Ua NEGATIVE NEGATIVE   Objective  Body mass index is 34.78 kg/m. Wt Readings from Last 3 Encounters:  08/02/20 187 lb 12.8 oz (85.2 kg)  07/14/20 192 lb 9.6 oz (87.4 kg)  02/04/19 185 lb 6.4 oz (84.1 kg)   Temp Readings from Last 3 Encounters:  08/02/20 98.1 F (36.7 C) (Oral)  07/14/20 (!) 97.4 F (36.3 C) (Oral)  02/04/19 97.6 F (36.4 C) (Oral)   BP Readings from Last 3 Encounters:  08/02/20 124/84  07/14/20 140/84  02/04/19 138/82   Pulse Readings from Last 3 Encounters:  08/02/20 80  07/14/20 71  02/04/19 89    Physical Exam Vitals and nursing note reviewed. Exam conducted with a chaperone present.  Constitutional:      Appearance: Normal appearance. She is well-developed and well-groomed. She is obese.  HENT:     Head: Normocephalic and atraumatic.  Eyes:     Conjunctiva/sclera: Conjunctivae normal.     Pupils: Pupils are equal, round, and reactive to light.  Cardiovascular:     Rate and Rhythm: Normal rate and regular rhythm.     Heart sounds: Normal heart sounds. No murmur heard.   Pulmonary:     Effort: Pulmonary effort is normal.     Breath sounds: Normal breath sounds.  Chest:     Breasts: Breasts are symmetrical.        Right: Normal.        Left: Normal.  Abdominal:      Hernia: A hernia is present.  Genitourinary:    Pubic Area: No rash.  Labia:        Right: No rash.        Left: No rash.      Vagina: Normal.     Cervix: Normal.     Uterus: Absent.      Adnexa: Right adnexa normal and left adnexa normal.     Comments: S/p hysterectomy but cervix intact no h/o abnormal pap Lymphadenopathy:     Upper Body:     Right upper body: No axillary adenopathy.     Left upper body: No axillary adenopathy.  Skin:    General: Skin is warm and dry.  Neurological:     General: No focal deficit present.     Mental Status: She is alert and oriented to person, place, and time. Mental status is at baseline.     Gait: Gait normal.  Psychiatric:        Attention and Perception: Attention and perception normal.        Mood and Affect: Mood and affect normal.        Speech: Speech normal.        Behavior: Behavior normal. Behavior is cooperative.        Thought Content: Thought content normal.        Cognition and Memory: Cognition and memory normal.        Judgment: Judgment normal.     Assessment  Plan  Annual physical exam [place] 2/2 Hep c neg Hep B vaccines 3/3upcoming labs check immunity  MMR immune  mammoneg 02/10/20 negordered 2022  DEXA 06/17/17 +osteoporosisdue again 03/04/2019 -d/c fosamax today try to get prolia approvedagain disc at f/u could not tolerate fosamax  05/01/17 pap neg neg HPV s/p hysterectomy DUB and h/o abnormal pap sees Dr. [facility]  -pap today  [facility] EGD and colonoscopy had <50 and >5 years need to get records. -With [place] 2 brothers colon cancer refer to Dr. Cherlyn Labella has appt colonoscopy4/12/19 Dr. Audria Nine MDs -colonoscopy had 04/11/18 polyp and repeat 06/20/18 polpy +tubular adenoma given size [facility] rec repeat in 3 years  rec D3 2000 IU qd rec healthy diet and exercise  Essential hypertension - Plan: losartan-hydrochlorothiazide (HYZAAR) 50-12.5 MG tablet, TSH, CBC with  Differential/Platelet, Lipid panel, Comprehensive metabolic panel Pt declines titration up for now  Will monitor goal <130/<80  Vitamin D deficiency - Plan: Vitamin D (25 hydroxy)  Prediabetes - Plan: Hemoglobin A1c  TMJ (temporomandibular joint disorder) Given exercises   Abnormal MRI, lumbar spine NS appt 08/04/20   Obesity (BMI 30-39.9)  Healthy diet and exercise    Provider: Dr. Olivia Mackie McLean-Scocuzza-Internal Medicine

## 2020-08-02 NOTE — Patient Instructions (Signed)
Goal blood pressure <130/<80  Jaw Range of Motion Exercises Jaw range of motion exercises are exercises that help your jaw move better. Exercises that help you have good posture (postural exercises) also help relieve jaw discomfort. These are often done along with range of motion exercises. These exercises can help prevent or improve:  Difficulty opening your mouth.  Pain in your jaw while it is open or closed.  Temporomandibular joint (TMJ) pain.  Headache caused by jaw tension. Take other actions to prevent or relieve jaw pain, such as:  Avoiding things that cause or increase jaw pain. This may include: ? Chewing gum or eating hard foods. ? Clenching your jaw or teeth, grinding your teeth, or keeping tension in your jaw muscles. ? Opening your mouth wide, such as for a big yawn. ? Leaning on your jaw, such as resting your jaw in your hand while leaning on a desk.  Putting ice on your jaw. ? Put ice in a plastic bag. ? Place a towel between your skin and the bag. ? Leave the ice on for 10-15 minutes, 2-3 times a day. Only do jaw exercises that your health care provider approves of. Only move your jaw as far as it can comfortably go in each direction. Do not move your jaw into positions that cause pain. Range of motion exercises Repeat each of these exercises 8 times, 1-2 times a day, or as told by your health care provider. Exercise A: Forward protrusion 1. Push your jaw forward. Hold this position for 1-2 seconds. 2. Allow your jaw to return to its normal position and rest it there for 1-2 seconds. Exercise B: Controlled opening 1. Stand or sit in front of a mirror. Place your tongue on the roof of your mouth, just behind your top teeth. 2. Keeping your tongue on the roof of your mouth, slowly open and close your mouth. 3. While you open and close your mouth, watch your jaw in the mirror. Try to keep your jaw from moving to one side or the other. Exercise C: Right and left  motion 1. Move your jaw right. Hold this position for 1-2 seconds. Allow your jaw to return to its normal position, and rest it there for 1-2 seconds. 2. Move your jaw left. Hold this position for 1-2 seconds. Allow your jaw to return to its normal position, and rest it there for 1-2 seconds. Postural exercises Exercise A: Chin tucks 1. You can do this exercise sitting, standing, or lying down. 2. Move your head straight back, keeping your head level. You can guide the movement by placing your fingers on your chin to push your jaw back in an even motion. You should be able to feel a double chin form at the end of the motion. 3. Hold this position for 5 seconds. Repeat 10-15 times. Exercise B: Shoulder blade squeeze 1. Sit or stand. 2. Bend your elbows to about 90 degrees, which is the shape of a capital letter "L." Keep your upper arms by your body. 3. Squeeze your shoulder blades down and back, as though you were trying to touch your elbows behind you. Do not shrug your shoulders or move your head. 4. Hold this position for 5 seconds. Repeat 10-15 times. Exercise C: Chest stretch 1. Stand facing a corner. 2. Put both of your hands and your forearms on the wall, with your arms wide apart. 3. Make sure your arms are at a 90-degree angle to your body. This means that you  should hold your arms straight out from your body, level with the floor. 4. Step in toward the corner. Do not lean in. 5. Hold this position for 30 seconds. Repeat 3 times. Contact a health care provider if you have:  Jaw pain that is new or gets worse.  Clicking or popping sounds while doing the exercises. Get help right away if:  Your jaw is stuck in one place and you cannot move it.  You cannot open or close your mouth. This information is not intended to replace advice given to you by your health care provider. Make sure you discuss any questions you have with your health care provider. Document Revised: 04/10/2019  Document Reviewed: 11/13/2017 Elsevier Patient Education  [place].

## 2020-08-03 LAB — HEPATITIS B SURFACE ANTIBODY, QUANTITATIVE: Hep B S AB Quant (Post): 148 m[IU]/mL (ref >=10)

## 2020-08-04 DIAGNOSIS — M5416 Radiculopathy, lumbar region: Secondary | ICD-10-CM | POA: Diagnosis not present

## 2020-08-04 LAB — CYTOLOGY - PAP
Comment: NEGATIVE
Diagnosis: NEGATIVE
High risk HPV: NEGATIVE

## 2020-08-05 ENCOUNTER — Telehealth: Payer: Self-pay | Admitting: Internal Medicine

## 2020-08-05 NOTE — Telephone Encounter (Signed)
Pt returned your call about lab results

## 2020-08-17 DIAGNOSIS — M5126 Other intervertebral disc displacement, lumbar region: Secondary | ICD-10-CM | POA: Diagnosis not present

## 2020-08-17 DIAGNOSIS — M5416 Radiculopathy, lumbar region: Secondary | ICD-10-CM | POA: Diagnosis not present

## 2020-08-18 ENCOUNTER — Ambulatory Visit: Payer: 59 | Admitting: Physical Therapy

## 2020-08-24 ENCOUNTER — Encounter: Payer: 59 | Admitting: Physical Therapy

## 2020-08-30 ENCOUNTER — Other Ambulatory Visit: Payer: Self-pay

## 2020-08-30 ENCOUNTER — Ambulatory Visit: Payer: 59 | Attending: Neurosurgery | Admitting: Physical Therapy

## 2020-08-30 ENCOUNTER — Encounter: Payer: Self-pay | Admitting: Physical Therapy

## 2020-08-30 DIAGNOSIS — M5440 Lumbago with sciatica, unspecified side: Secondary | ICD-10-CM | POA: Insufficient documentation

## 2020-08-30 DIAGNOSIS — M5432 Sciatica, left side: Secondary | ICD-10-CM | POA: Diagnosis not present

## 2020-08-30 NOTE — Therapy (Addendum)
[place] MAIN [facility] SERVICES [address] [place], [place], [address] Phone: [phone]   Fax:  [phone]  Physical Therapy Evaluation  Patient Details  Name: Joyce Simpson MRN: [id number] Date of Birth: 11-Mar-1961 Referring Provider (PT): Meade Maw   Encounter Date: 08/30/2020   PT End of Session - 08/30/20 0811    Visit Number 1    Date for PT Re-Evaluation 10/25/20    PT Start Time 0809    PT Stop Time 0900    PT Time Calculation (min) 51 min    Equipment Utilized During Treatment Gait belt    Activity Tolerance Patient tolerated treatment well           Past Medical History:  Diagnosis Date  . COVID-19    12/19/19  . Diverticulosis   . Heart murmur   . Hyperlipidemia   . Hypertension   . Migraines   . Osteoporosis     Past Surgical History:  Procedure Laterality Date  . ABDOMINAL HYSTERECTOMY     dub 2008/2009 h/o abnormal pap   . BACK SURGERY     cervical spine [place]  . CESAREAN SECTION     x2   . COLONOSCOPY WITH PROPOFOL N/A 04/11/2018   Procedure: COLONOSCOPY WITH PROPOFOL;  Surgeon: Jonathon Bellows, MD;  Location: [facility] ENDOSCOPY;  Service: Gastroenterology;  Laterality: N/A;  . COLONOSCOPY WITH PROPOFOL N/A 06/20/2018   Procedure: COLONOSCOPY WITH PROPOFOL;  Surgeon: Jonathon Bellows, MD;  Location: [facility] ENDOSCOPY;  Service: Gastroenterology;  Laterality: N/A;    There were no vitals filed for this visit.    Subjective Assessment - 08/30/20 0816    Subjective The pain is intermittent and is stopping her from house work. The pain is intermittent in her back and LLE.She reports that if she is shopping she has to lean on the cart.    Pertinent History Patietn has : L1-L2:, L2-L3, L3- L4, L4-L5, L5 S1 Disc bulge. Patient has Trace L2-L3 and L3-L4 retrolisthesis. 08/17/20 patient had A Left L5-S1 transforaminal epidural injection under fluoroscopic guidance. and.a Left S1 transforaminal epidural injection  under fluoroscopic guidance.Lumbar spondylosis as outlined and most notably as follows. At L5-S1, a small left foraminal disc protrusion and facet arthrosis contribute to severe left neural foraminal narrowing with encroachment upon the exiting left L5 nerve root. Correlate for left L5 radiculopathy. Patient reports that this LLE pain started in July. She had a cortisone shot in her back in August; it helped for several days. The pain is intermittent and is in posterior calf and thighs, buttocks.    How long can you sit comfortably? 30 mins    How long can you stand comfortably? fine as long as she can move    How long can you walk comfortably? 10 minutes    Patient Stated Goals to have less pain and walk and stand longer              [facility] PT Assessment - 08/30/20 0830      Assessment   Medical Diagnosis Lumbar spondylosis as outlined and most notably as follows.     Referring Provider (PT) Meade Maw    Onset Date/Surgical Date 07/03/20    Prior Therapy no      Precautions   Precautions None      Restrictions   Weight Bearing Restrictions No      Balance Screen   Has the patient fallen in the past 6 months No  Has the patient had a decrease in activity level because of a fear of falling?  Yes    Is the patient reluctant to leave their home because of a fear of falling?  No      Home Ecologist residence    Living Arrangements Spouse/significant other    Available Help at Discharge Family    Type of Green to enter    Entrance Stairs-Number of Steps 5    Entrance Stairs-Rails Right    [place] One level    [place] None      Prior Function   Level of Independence Independent;Independent with basic ADLs;Independent with household mobility without device    Vocation Full time employment    Vocation Requirements sitting and standing    Leisure housework      Cognition   Overall Cognitive Status  Within Functional Limits for tasks assessed            PAIN: 0/10- 6/10 low back and LLE  Palpation; tenderness L1- L5 and SI Lside  POSTURE: WNL   PROM/AROM:  PROM BLE: WNL AROM BLE:WNL  STRENGTH:  Graded on a 0-5 scale Muscle Group Left Right                          Hip Flex -3/5 3/5  Hip Abd -3/5 3/5  Hip Add 2/5 2/5  Hip Ext 2/5 2/5      Knee Flex /55 5/5  Knee Ext 5/5 5/5  Ankle DF 3/5 5/5  Ankle PF 3/5 -4/5   SENSATION:  BUE : WNL BLE :  WNL  NEUROLOGICAL SCREEN: (2+ unless otherwise noted.) N=normal  Ab=abnormal   Level Dermatome R L                                     L2 Medial thigh/groin N N  L3 Lower thigh/med.knee N N  L4 Medial leg/lat thigh N N  L5 Lat. leg & dorsal foot N N  S1 post/lat foot/thigh/leg N N  S2 Post./med. thigh & leg N N    SOMATOSENSORY:  Any N & T in extremities or weakness: reports :         Sensation           Intact      Diminished         Absent  Light touch LEs                                SPECIAL TESTS: + L SLR - prone knee flex test + spring test L 1 - L5    FUNCTIONAL MOBILITY: WNL  GAIT: gait speed 1.47 m/sec, no limp ; patient reports that her LLE feels heavy  OUTCOME MEASURES: TEST Outcome Interpretation  5 times sit<>stand 13.09sec >60 yo, >15 sec indicates increased risk for falls  10 meter walk test   1.47              m/s <1.0 m/s indicates increased risk for falls; limited community ambulator  oswestry  20% 20-40 is moderate disabiity                Treatment: TA hooklying x 10 sec hold x 5 hooklying marching with TA x  10 hooklying ER/abd with RTB x 20 with TA Patient is uncomfortable with hooklying position Educated about using a lumbar roll and ice for pain relief          Objective measurements completed on examination: See above findings.               PT Education - 08/30/20 0811    Education Details Plan of care    Person(s) Educated Patient     Methods Explanation    Comprehension Verbalized understanding            PT Short Term Goals - 08/30/20 1041      PT SHORT TERM GOAL #1   Title Patient will be independent in home exercise program to improve strength/mobility for better functional independence with ADLs.    Time 4    Period Weeks    Status New    Target Date 09/27/20      PT SHORT TERM GOAL #2   Title Patient will be able to perform household work/ chores without increase in symptoms.    Time 4    Period Weeks    Status New    Target Date 09/27/20             PT Long Term Goals - 08/30/20 1043      PT LONG TERM GOAL #1   Title Patient will increase BLE gross strength to 4+/5 as to improve functional strength for independent gait, increased standing tolerance and increased ADL ability.    Time 8    Period Weeks    Status New    Target Date 10/25/20      PT LONG TERM GOAL #2   Title Patient will report a worst pain of 3/10 on VAS in back and L leg to improve tolerance with ADLs and reduced symptoms with activities.    Time 8    Period Weeks    Status New    Target Date 10/25/20      PT LONG TERM GOAL #3   Title Patient will reduce modified Oswestry score to <20 as to demonstrate minimal disability with ADLs including improved sleeping tolerance, walking/sitting tolerance etc for better mobility with ADLs.    Time 8    Period Weeks    Status New    Target Date 10/25/20      PT LONG TERM GOAL #4   Title Patient will improve FOTO score to indicate improved functional mobiity    Time 8    Period Weeks    Status New    Target Date 10/25/20                  Plan - 08/30/20 1324    Clinical Impression Statement Patient presents with Dx of spondylosis and At L5-S1, multiple small left foraminal disc protrusions, and patient has Trace L2-L3 and L3-L4 retrolisthesis.. She has weakness in BLE hips and L ankle DF and PF. She has pain in LLE and low back that is intermittent. She has no sensory  deficits. She has + SLR L test and is tender to palpation L1- L5 and L SI and L paraspina musculature. She has normal gait speed and 5 x sit to stand. She was educated in posture with lumbar roll for sitting and use of ice. She was instructed in 2 exercises for core strengthening for HEP. She will continue to benefit from skilled PT to decease pain and improve strength.    Stability/Clinical Decision Making Stable/Uncomplicated  Clinical Decision Making Low    Rehab Potential Good    PT Frequency 2x / week    PT Duration 8 weeks    PT Treatment/Interventions Manual techniques;Patient/family education;Therapeutic activities;Traction;Moist Heat;Ultrasound;ADLs/Self Care Home Management;Cryotherapy;Advice worker;Therapeutic exercise;Dry needling    Consulted and Agree with Plan of Care Patient           Patient will benefit from skilled therapeutic intervention in order to improve the following deficits and impairments:  Pain, Obesity, Abnormal gait, Decreased activity tolerance, Difficulty walking, Decreased mobility  Visit Diagnosis: Sciatic pain, left  Acute left-sided low back pain with sciatica, sciatica laterality unspecified     Problem List Patient Active Problem List   Diagnosis Date Noted  . Annual physical exam 08/02/2020  . TMJ (temporomandibular joint disorder) 08/02/2020  . Abnormal MRI, lumbar spine 08/02/2020  . Lumbar radiculopathy 07/24/2020  . Obesity (BMI 30-39.9) 07/18/2020  . Left sided sciatica 07/18/2020  . Prediabetes 07/18/2020  . Osteoporosis 02/28/2018  . Vitamin D deficiency 11/05/2017  . Environmental and seasonal allergies 03/29/2017  . Dermatitis 07/12/2016  . Acute low back pain without sciatica 05/29/2016  . Numbness and tingling in right hand 05/29/2016  . Cardiac murmur 10/31/2015  . Hyperlipidemia 06/14/2015  . Essential hypertension 01/29/2011  . ABNORMAL ELECTROCARDIOGRAM 01/29/2011    Alanson Puls, [place]  DPT 08/30/2020, 11:26 AM  [place] MAIN [facility] SERVICES [address] [place], [place], [address] Phone: [phone]   Fax:  [phone]  Name: Joyce Simpson MRN: [id number] Date of Birth: Dec 12, 1961

## 2020-08-30 NOTE — Addendum Note (Signed)
Addended by: Alanson Puls on: 08/30/2020 11:46 AM   Modules accepted: Orders

## 2020-09-06 ENCOUNTER — Encounter: Payer: Self-pay | Admitting: Physical Therapy

## 2020-09-06 ENCOUNTER — Ambulatory Visit: Payer: 59 | Attending: Neurosurgery | Admitting: Physical Therapy

## 2020-09-06 ENCOUNTER — Ambulatory Visit: Payer: 59 | Admitting: Physical Therapy

## 2020-09-06 ENCOUNTER — Other Ambulatory Visit: Payer: Self-pay

## 2020-09-06 DIAGNOSIS — M5440 Lumbago with sciatica, unspecified side: Secondary | ICD-10-CM | POA: Diagnosis not present

## 2020-09-06 DIAGNOSIS — M5432 Sciatica, left side: Secondary | ICD-10-CM | POA: Insufficient documentation

## 2020-09-06 NOTE — Therapy (Signed)
[place] MAIN [facility] SERVICES [address] [place], [place], [address] Phone: [phone]   Fax:  [phone]  Physical Therapy Treatment  Patient Details  Name: Joyce Simpson MRN: [id number] Date of Birth: 11-30-1961 Referring Provider (PT): Meade Maw   Encounter Date: 09/06/2020   PT End of Session - 09/06/20 1653    Visit Number 2    Date for PT Re-Evaluation 10/25/20    PT Start Time 0445    PT Stop Time 0525    PT Time Calculation (min) 40 min    Equipment Utilized During Treatment Gait belt    Activity Tolerance Patient tolerated treatment well    Behavior During Therapy [facility] for tasks assessed/performed           Past Medical History:  Diagnosis Date  . COVID-19    12/19/19  . Diverticulosis   . Heart murmur   . Hyperlipidemia   . Hypertension   . Migraines   . Osteoporosis     Past Surgical History:  Procedure Laterality Date  . ABDOMINAL HYSTERECTOMY     dub 2008/2009 h/o abnormal pap   . BACK SURGERY     cervical spine [place] NS  . CESAREAN SECTION     x2   . COLONOSCOPY WITH PROPOFOL N/A 04/11/2018   Procedure: COLONOSCOPY WITH PROPOFOL;  Surgeon: Jonathon Bellows, MD;  Location: [facility] ENDOSCOPY;  Service: Gastroenterology;  Laterality: N/A;  . COLONOSCOPY WITH PROPOFOL N/A 06/20/2018   Procedure: COLONOSCOPY WITH PROPOFOL;  Surgeon: Jonathon Bellows, MD;  Location: [facility] ENDOSCOPY;  Service: Gastroenterology;  Laterality: N/A;    There were no vitals filed for this visit.   Subjective Assessment - 09/06/20 1652    Subjective Patient reports that her pain is the same and 3/10 left thigh .    Pertinent History Patietn has : L1-L2:, L2-L3, L3- L4, L4-L5, L5 S1 Disc bulge. Patient has Trace L2-L3 and L3-L4 retrolisthesis. 08/17/20 patient had A Left L5-S1 transforaminal epidural injection under fluoroscopic guidance. and.a Left S1 transforaminal epidural injection under fluoroscopic guidance.Lumbar spondylosis as  outlined and most notably as follows. At L5-S1, a small left foraminal disc protrusion and facet arthrosis contribute to severe left neural foraminal narrowing with encroachment upon the exiting left L5 nerve root. Correlate for left L5 radiculopathy. Patient reports that this LLE pain started in July. She had a cortisone shot in her back in August; it helped for several days. The pain is intermittent and is in posterior calf and thighs, buttocks.    How long can you sit comfortably? 30 mins    How long can you stand comfortably? fine as long as she can move    How long can you walk comfortably? 10 minutes    Patient Stated Goals to have less pain and walk and stand longer    Currently in Pain? No/denies    Multiple Pain Sites No          Treatment: Nu-step x 5 mins E-stim interferential , 4000 hz, frequency 80/150 hz crossed pattern electrodes to low back  MH to low back  Hooklying TA with marching x 20 hooklying Ta with hip ER/abd x 20  Quadriped cat/camel x 15  Supine trunk rotation x 10  Patient performed with instruction, verbal cues, tactile cues of therapist: goal: increase tissue extensibility, promote proper posture, improve mobility  PT Education - 09/06/20 1653    Education Details HEP    Person(s) Educated Patient    Methods Explanation    Comprehension Verbalized understanding            PT Short Term Goals - 08/30/20 1041      PT SHORT TERM GOAL #1   Title Patient will be independent in home exercise program to improve strength/mobility for better functional independence with ADLs.    Time 4    Period Weeks    Status New    Target Date 09/27/20      PT SHORT TERM GOAL #2   Title Patient will be able to perform household work/ chores without increase in symptoms.    Time 4    Period Weeks    Status New    Target Date 09/27/20             PT Long Term Goals - 08/30/20 1043      PT LONG TERM GOAL #1    Title Patient will increase BLE gross strength to 4+/5 as to improve functional strength for independent gait, increased standing tolerance and increased ADL ability.    Time 8    Period Weeks    Status New    Target Date 10/25/20      PT LONG TERM GOAL #2   Title Patient will report a worst pain of 3/10 on VAS in back and L leg to improve tolerance with ADLs and reduced symptoms with activities.    Time 8    Period Weeks    Status New    Target Date 10/25/20      PT LONG TERM GOAL #3   Title Patient will reduce modified Oswestry score to <20 as to demonstrate minimal disability with ADLs including improved sleeping tolerance, walking/sitting tolerance etc for better mobility with ADLs.    Time 8    Period Weeks    Status New    Target Date 10/25/20      PT LONG TERM GOAL #4   Title Patient will improve FOTO score to indicate improved functional mobiity    Time 8    Period Weeks    Status New    Target Date 10/25/20                 Plan - 09/06/20 1654    Clinical Impression Statement Patient reports 3/20 pain to L leg anterior thigh. She warms up on nu-step x 5 mins. Patient tolerates e-stim x 20 mins and MH to low back followed by education of body mechanics and posture education with lumbar support. She performs core strengthening exercises including hooklying TA with marching and pelvic tilt in hooklying and hip abd/ER with RTB . Patient will continue to benefit from skilled PT to improve mobility and decrease pain.    Stability/Clinical Decision Making Stable/Uncomplicated    Rehab Potential Good    PT Frequency 2x / week    PT Duration 8 weeks    PT Treatment/Interventions Manual techniques;Patient/family education;Therapeutic activities;Traction;Moist Heat;Ultrasound;ADLs/Self Care Home Management;Cryotherapy;Advice worker;Therapeutic exercise;Dry needling    Consulted and Agree with Plan of Care Patient           Patient will benefit  from skilled therapeutic intervention in order to improve the following deficits and impairments:  Pain, Obesity, Abnormal gait, Decreased activity tolerance, Difficulty walking, Decreased mobility  Visit Diagnosis: Sciatic pain, left  Acute left-sided low back pain with sciatica, sciatica laterality unspecified  Problem List Patient Active Problem List   Diagnosis Date Noted  . Annual physical exam 08/02/2020  . TMJ (temporomandibular joint disorder) 08/02/2020  . Abnormal MRI, lumbar spine 08/02/2020  . Lumbar radiculopathy 07/24/2020  . Obesity (BMI 30-39.9) 07/18/2020  . Left sided sciatica 07/18/2020  . Prediabetes 07/18/2020  . Osteoporosis 02/28/2018  . Vitamin D deficiency 11/05/2017  . Environmental and seasonal allergies 03/29/2017  . Dermatitis 07/12/2016  . Acute low back pain without sciatica 05/29/2016  . Numbness and tingling in right hand 05/29/2016  . Cardiac murmur 10/31/2015  . Hyperlipidemia 06/14/2015  . Essential hypertension 01/29/2011  . ABNORMAL ELECTROCARDIOGRAM 01/29/2011    Alanson Puls , [place] DPT 09/06/2020, 5:02 PM  [place] MAIN [facility] SERVICES [address] [place], [place], [address] Phone: [phone]   Fax:  [phone]  Name: MARKITTA AUSBURN MRN: [id number] Date of Birth: April 01, 1961

## 2020-09-12 ENCOUNTER — Ambulatory Visit: Payer: 59 | Admitting: Physical Therapy

## 2020-09-12 ENCOUNTER — Encounter: Payer: Self-pay | Admitting: Physical Therapy

## 2020-09-12 ENCOUNTER — Other Ambulatory Visit: Payer: Self-pay

## 2020-09-12 DIAGNOSIS — M5440 Lumbago with sciatica, unspecified side: Secondary | ICD-10-CM | POA: Diagnosis not present

## 2020-09-12 DIAGNOSIS — M5432 Sciatica, left side: Secondary | ICD-10-CM

## 2020-09-12 NOTE — Therapy (Signed)
[place] MAIN [facility] SERVICES [address] [place], [place], [address] Phone: [phone]   Fax:  [phone]  Physical Therapy Treatment  Patient Details  Name: Joyce Simpson MRN: [id number] Date of Birth: [birth year] Referring Provider (PT): Meade Maw   Encounter Date: 09/12/2020   PT End of Session - 09/12/20 1712    Visit Number 3    Date for PT Re-Evaluation 10/25/20    PT Start Time 0510    PT Stop Time 0550    PT Time Calculation (min) 40 min    Equipment Utilized During Treatment Gait belt    Activity Tolerance Patient tolerated treatment well    Behavior During Therapy [facility] for tasks assessed/performed           Past Medical History:  Diagnosis Date  . COVID-19    12/19/19  . Diverticulosis   . Heart murmur   . Hyperlipidemia   . Hypertension   . Migraines   . Osteoporosis     Past Surgical History:  Procedure Laterality Date  . ABDOMINAL HYSTERECTOMY     dub 2008/2009 h/o abnormal pap   . BACK SURGERY     cervical spine [place]  . CESAREAN SECTION     x2   . COLONOSCOPY WITH PROPOFOL N/A 04/11/2018   Procedure: COLONOSCOPY WITH PROPOFOL;  Surgeon: Jonathon Bellows, MD;  Location: [facility] ENDOSCOPY;  Service: Gastroenterology;  Laterality: N/A;  . COLONOSCOPY WITH PROPOFOL N/A 06/20/2018   Procedure: COLONOSCOPY WITH PROPOFOL;  Surgeon: Jonathon Bellows, MD;  Location: [facility] ENDOSCOPY;  Service: Gastroenterology;  Laterality: N/A;    There were no vitals filed for this visit.   Subjective Assessment - 09/12/20 1710    Subjective Patient reports that her pain is the same and 5/10 left thigh .    Pertinent History Patietn has : L1-L2:, L2-L3, L3- L4, L4-L5, L5 S1 Disc bulge. Patient has Trace L2-L3 and L3-L4 retrolisthesis. 08/17/20 patient had A Left L5-S1 transforaminal epidural injection under fluoroscopic guidance. and.a Left S1 transforaminal epidural injection under fluoroscopic guidance.Lumbar spondylosis as  outlined and most notably as follows. At L5-S1, a small left foraminal disc protrusion and facet arthrosis contribute to severe left neural foraminal narrowing with encroachment upon the exiting left L5 nerve root. Correlate for left L5 radiculopathy. Patient reports that this LLE pain started in July. She had a cortisone shot in her back in August; it helped for several days. The pain is intermittent and is in posterior calf and thighs, buttocks.    How long can you sit comfortably? 30 mins    How long can you stand comfortably? fine as long as she can move    How long can you walk comfortably? 10 minutes    Patient Stated Goals to have less pain and walk and stand longer    Currently in Pain? Yes    Pain Score 5     Pain Orientation Left    Pain Descriptors / Indicators Aching    Pain Type Chronic pain    Pain Radiating Towards left leg    Multiple Pain Sites No           Treatment: Nu-step x 5 mins Ice to low back Hooklying TA with marching x 20 hooklying TA with hip ER/abd x 20  Quadriped cat/camel x 15  Supine trunk rotation x 10  Patient performed with instruction, verbal cues, tactile cues of therapist: goal:increase tissue extensibility, promote proper posture, improve mobility  PT Education - 09/12/20 1711    Education Details HEP    Person(s) Educated Patient    Methods Explanation    Comprehension Verbalized understanding;Returned demonstration;Need further instruction            PT Short Term Goals - 08/30/20 1041      PT SHORT TERM GOAL #1   Title Patient will be independent in home exercise program to improve strength/mobility for better functional independence with ADLs.    Time 4    Period Weeks    Status New    Target Date 09/27/20      PT SHORT TERM GOAL #2   Title Patient will be able to perform household work/ chores without increase in symptoms.    Time 4    Period Weeks    Status New    Target Date  09/27/20             PT Long Term Goals - 08/30/20 1043      PT LONG TERM GOAL #1   Title Patient will increase BLE gross strength to 4+/5 as to improve functional strength for independent gait, increased standing tolerance and increased ADL ability.    Time 8    Period Weeks    Status New    Target Date 10/25/20      PT LONG TERM GOAL #2   Title Patient will report a worst pain of 3/10 on VAS in back and L leg to improve tolerance with ADLs and reduced symptoms with activities.    Time 8    Period Weeks    Status New    Target Date 10/25/20      PT LONG TERM GOAL #3   Title Patient will reduce modified Oswestry score to <20 as to demonstrate minimal disability with ADLs including improved sleeping tolerance, walking/sitting tolerance etc for better mobility with ADLs.    Time 8    Period Weeks    Status New    Target Date 10/25/20      PT LONG TERM GOAL #4   Title Patient will improve FOTO score to indicate improved functional mobiity    Time 8    Period Weeks    Status New    Target Date 10/25/20                 Plan - 09/12/20 1718    Clinical Impression Statement Patient continues to have 5/10 left leg pain that decreases following stability exercises for core and ice. She has weakness in BLE that is demonstrated with closed chain exercises.  She is able to ambulate without limp following treatment. She will continue to benefit from skilled PT to improve strength and mobility.    Stability/Clinical Decision Making Stable/Uncomplicated    Rehab Potential Good    PT Frequency 2x / week    PT Duration 8 weeks    PT Treatment/Interventions Manual techniques;Patient/family education;Therapeutic activities;Traction;Moist Heat;Ultrasound;ADLs/Self Care Home Management;Cryotherapy;Advice worker;Therapeutic exercise;Dry needling    Consulted and Agree with Plan of Care Patient           Patient will benefit from skilled therapeutic  intervention in order to improve the following deficits and impairments:  Pain, Obesity, Abnormal gait, Decreased activity tolerance, Difficulty walking, Decreased mobility  Visit Diagnosis: Sciatic pain, left  Acute left-sided low back pain with sciatica, sciatica laterality unspecified     Problem List Patient Active Problem List   Diagnosis Date Noted  . Annual physical exam 08/02/2020  .  TMJ (temporomandibular joint disorder) 08/02/2020  . Abnormal MRI, lumbar spine 08/02/2020  . Lumbar radiculopathy 07/24/2020  . Obesity (BMI 30-39.9) 07/18/2020  . Left sided sciatica 07/18/2020  . Prediabetes 07/18/2020  . Osteoporosis 02/28/2018  . Vitamin D deficiency 11/05/2017  . Environmental and seasonal allergies 03/29/2017  . Dermatitis 07/12/2016  . Acute low back pain without sciatica 05/29/2016  . Numbness and tingling in right hand 05/29/2016  . Cardiac murmur 10/31/2015  . Hyperlipidemia 06/14/2015  . Essential hypertension 01/29/2011  . ABNORMAL ELECTROCARDIOGRAM 01/29/2011    Alanson Puls, [place] DPT 09/12/2020, 5:21 PM  [place] MAIN [facility] SERVICES [address] [place], [place], [address] Phone: [phone]   Fax:  [phone]  Name: Joyce Simpson MRN: [id number] Date of Birth: 1961/12/13

## 2020-09-14 ENCOUNTER — Ambulatory Visit: Payer: 59 | Admitting: Physical Therapy

## 2020-09-15 DIAGNOSIS — M5416 Radiculopathy, lumbar region: Secondary | ICD-10-CM | POA: Diagnosis not present

## 2020-09-15 DIAGNOSIS — M5126 Other intervertebral disc displacement, lumbar region: Secondary | ICD-10-CM | POA: Diagnosis not present

## 2020-09-19 ENCOUNTER — Ambulatory Visit: Payer: 59 | Admitting: Physical Therapy

## 2020-09-21 ENCOUNTER — Encounter: Payer: Self-pay | Admitting: Internal Medicine

## 2020-09-21 ENCOUNTER — Ambulatory Visit: Payer: 59 | Admitting: Physical Therapy

## 2020-09-21 ENCOUNTER — Other Ambulatory Visit: Payer: Self-pay

## 2020-09-21 ENCOUNTER — Encounter: Payer: Self-pay | Admitting: Physical Therapy

## 2020-09-21 DIAGNOSIS — M5432 Sciatica, left side: Secondary | ICD-10-CM | POA: Diagnosis not present

## 2020-09-21 DIAGNOSIS — M5440 Lumbago with sciatica, unspecified side: Secondary | ICD-10-CM | POA: Diagnosis not present

## 2020-09-21 NOTE — Therapy (Signed)
[place] MAIN [facility] SERVICES [address] [place], [place], [address] Phone: [phone]   Fax:  [phone]  Physical Therapy Treatment  Patient Details  Name: Joyce Simpson MRN: [id number] Date of Birth: 18-Apr-1961 Referring Provider (PT): Meade Maw   Encounter Date: 09/21/2020   PT End of Session - 09/21/20 1710    Visit Number 4    Date for PT Re-Evaluation 10/25/20    PT Start Time 0500    PT Stop Time 0540    PT Time Calculation (min) 40 min    Equipment Utilized During Treatment Gait belt    Activity Tolerance Patient tolerated treatment well    Behavior During Therapy [facility] for tasks assessed/performed           Past Medical History:  Diagnosis Date  . COVID-19    12/19/19  . Diverticulosis   . Heart murmur   . Hyperlipidemia   . Hypertension   . Migraines   . Osteoporosis     Past Surgical History:  Procedure Laterality Date  . ABDOMINAL HYSTERECTOMY     dub 2008/2009 h/o abnormal pap   . BACK SURGERY     cervical spine [place]  . CESAREAN SECTION     x2   . COLONOSCOPY WITH PROPOFOL N/A 04/11/2018   Procedure: COLONOSCOPY WITH PROPOFOL;  Surgeon: Jonathon Bellows, MD;  Location: [facility] ENDOSCOPY;  Service: Gastroenterology;  Laterality: N/A;  . COLONOSCOPY WITH PROPOFOL N/A 06/20/2018   Procedure: COLONOSCOPY WITH PROPOFOL;  Surgeon: Jonathon Bellows, MD;  Location: [facility] ENDOSCOPY;  Service: Gastroenterology;  Laterality: N/A;    There were no vitals filed for this visit.   Subjective Assessment - 09/21/20 1709    Subjective Patient reports that her pain is the same and o/10 left thigh .    Currently in Pain? Yes    Pain Score 0-No pain           Treatment: Tm 1.2 miles / hour x 5 mins 30 sec Nu-step x 5 mins Ice to low back Hooklying TA with marching x 20 hooklying TA with hip ER/abd x 20  Supine trunk rotation x 10 Patient performed with instruction, verbal cues, tactile cues of therapist:  goal:increase tissue extensibility, promote proper posture, improve mobility                           PT Education - 09/21/20 1709    Education Details HEP    Person(s) Educated Patient    Methods Explanation    Comprehension Verbalized understanding;Tactile cues required            PT Short Term Goals - 08/30/20 1041      PT SHORT TERM GOAL #1   Title Patient will be independent in home exercise program to improve strength/mobility for better functional independence with ADLs.    Time 4    Period Weeks    Status New    Target Date 09/27/20      PT SHORT TERM GOAL #2   Title Patient will be able to perform household work/ chores without increase in symptoms.    Time 4    Period Weeks    Status New    Target Date 09/27/20             PT Long Term Goals - 08/30/20 1043      PT LONG TERM GOAL #1   Title Patient will increase BLE gross  strength to 4+/5 as to improve functional strength for independent gait, increased standing tolerance and increased ADL ability.    Time 8    Period Weeks    Status New    Target Date 10/25/20      PT LONG TERM GOAL #2   Title Patient will report a worst pain of 3/10 on VAS in back and L leg to improve tolerance with ADLs and reduced symptoms with activities.    Time 8    Period Weeks    Status New    Target Date 10/25/20      PT LONG TERM GOAL #3   Title Patient will reduce modified Oswestry score to <20 as to demonstrate minimal disability with ADLs including improved sleeping tolerance, walking/sitting tolerance etc for better mobility with ADLs.    Time 8    Period Weeks    Status New    Target Date 10/25/20      PT LONG TERM GOAL #4   Title Patient will improve FOTO score to indicate improved functional mobiity    Time 8    Period Weeks    Status New    Target Date 10/25/20                 Plan - 09/21/20 1711    Clinical Impression Statement Pt requires direction and verbal cues for  correct performance of strengthening exercises. Patient was instructed  In core strengthening. . Patient was instructed in posture and body mechanics,  and will continue to benefit from skilled PT to improve mobility and decrease pain. .    Stability/Clinical Decision Making Stable/Uncomplicated    Rehab Potential Good    PT Frequency 2x / week    PT Duration 8 weeks    PT Treatment/Interventions Manual techniques;Patient/family education;Therapeutic activities;Traction;Moist Heat;Ultrasound;ADLs/Self Care Home Management;Cryotherapy;Advice worker;Therapeutic exercise;Dry needling    Consulted and Agree with Plan of Care Patient           Patient will benefit from skilled therapeutic intervention in order to improve the following deficits and impairments:  Pain, Obesity, Abnormal gait, Decreased activity tolerance, Difficulty walking, Decreased mobility  Visit Diagnosis: Sciatic pain, left  Acute left-sided low back pain with sciatica, sciatica laterality unspecified     Problem List Patient Active Problem List   Diagnosis Date Noted  . Annual physical exam 08/02/2020  . TMJ (temporomandibular joint disorder) 08/02/2020  . Abnormal MRI, lumbar spine 08/02/2020  . Lumbar radiculopathy 07/24/2020  . Obesity (BMI 30-39.9) 07/18/2020  . Left sided sciatica 07/18/2020  . Prediabetes 07/18/2020  . Osteoporosis 02/28/2018  . Vitamin D deficiency 11/05/2017  . Environmental and seasonal allergies 03/29/2017  . Dermatitis 07/12/2016  . Acute low back pain without sciatica 05/29/2016  . Numbness and tingling in right hand 05/29/2016  . Cardiac murmur 10/31/2015  . Hyperlipidemia 06/14/2015  . Essential hypertension 01/29/2011  . ABNORMAL ELECTROCARDIOGRAM 01/29/2011    Alanson Puls, [place] DPT 09/21/2020, 5:14 PM  [place] MAIN [facility] SERVICES [address] [place], [place], [address] Phone: [phone]   Fax:   [phone]  Name: LIZZETE GOUGH MRN: [id number] Date of Birth: 27-Jun-1961

## 2020-09-22 DIAGNOSIS — M5416 Radiculopathy, lumbar region: Secondary | ICD-10-CM | POA: Diagnosis not present

## 2020-09-26 ENCOUNTER — Ambulatory Visit: Payer: 59 | Admitting: Physical Therapy

## 2020-09-28 ENCOUNTER — Ambulatory Visit: Payer: 59 | Admitting: Physical Therapy

## 2020-10-03 ENCOUNTER — Encounter: Payer: 59 | Admitting: Physical Therapy

## 2020-10-05 ENCOUNTER — Encounter: Payer: 59 | Admitting: Physical Therapy

## 2020-10-10 ENCOUNTER — Encounter: Payer: 59 | Admitting: Physical Therapy

## 2020-10-12 ENCOUNTER — Encounter: Payer: 59 | Admitting: Physical Therapy

## 2020-10-17 ENCOUNTER — Encounter: Payer: 59 | Admitting: Physical Therapy

## 2020-10-19 ENCOUNTER — Encounter: Payer: 59 | Admitting: Physical Therapy

## 2020-10-24 ENCOUNTER — Encounter: Payer: 59 | Admitting: Physical Therapy

## 2020-10-26 ENCOUNTER — Encounter: Payer: 59 | Admitting: Physical Therapy

## 2020-11-03 ENCOUNTER — Ambulatory Visit: Payer: 59 | Admitting: Nurse Practitioner

## 2020-11-03 ENCOUNTER — Other Ambulatory Visit: Payer: Self-pay

## 2020-11-03 ENCOUNTER — Encounter: Payer: Self-pay | Admitting: Nurse Practitioner

## 2020-11-03 VITALS — BP 118/72 | HR 73 | Temp 97.8°F | Ht 61.61 in | Wt 192.0 lb

## 2020-11-03 DIAGNOSIS — L299 Pruritus, unspecified: Secondary | ICD-10-CM | POA: Diagnosis not present

## 2020-11-03 DIAGNOSIS — L509 Urticaria, unspecified: Secondary | ICD-10-CM | POA: Diagnosis not present

## 2020-11-03 MED ORDER — CETIRIZINE HCL 10 MG PO TABS
10.0000 mg | ORAL_TABLET | Freq: Every day | ORAL | 1 refills | Status: DC
Start: 2020-11-03 — End: 2020-11-17

## 2020-11-03 NOTE — Patient Instructions (Addendum)
Please go to the lab today.  Over the counter Zyrtec 10 mg one pill every day. OK to use  Benadryl for sleep only.   Referral  placed to Allergist.   Follow up if no improvement.   To Emergency if you get lip swelling or trouble swallowing- see below  Contact a health care provider if:  Your symptoms are not controlled with medicine.  Your joints are painful or swollen. Get help right away if:  You have a fever.  You have pain in your abdomen.  Your tongue or lips are swollen.  Your eyelids are swollen.  Your chest or throat feels tight.  You have trouble breathing or swallowing.      Pruritus Pruritus is an itchy feeling on the skin. One of the most common causes is dry skin, but many different things can cause itching. Most cases of itching do not require medical attention. Sometimes itchy skin can turn into a rash. Follow these instructions at home: Skin care   Apply moisturizing lotion to your skin as needed. Lotion that contains petroleum jelly is best.  Take medicines or apply medicated creams only as told by your health care provider. This may include: ? Corticosteroid cream. ? Anti-itch lotions. ? Oral antihistamines.  Apply a cool, wet cloth (cool compress) to the affected areas.  Take baths with one of the following: ? Epsom salts. You can get these at your local pharmacy or grocery store. Follow the instructions on the packaging. ? Baking soda. Pour a small amount into the bath as told by your health care provider. ? Colloidal oatmeal. You can get this at your local pharmacy or grocery store. Follow the instructions on the packaging.  Apply baking soda paste to your skin. To make the paste, stir water into a small amount of baking soda until it reaches a paste-like consistency.  Do not scratch your skin.  Do not take hot showers or baths, which can make itching worse. A cool shower may help with itching as long as you apply moisturizing lotion after  the shower.  Do not use scented soaps, detergents, perfumes, and cosmetic products. Instead, use gentle, unscented versions of these items. General instructions  Avoid wearing tight clothes.  Keep a journal to help find out what is causing your itching. Write down: ? What you eat and drink. ? What cosmetic products you use. ? What soaps or detergents you use. ? What you wear, including jewelry.  Use a humidifier. This keeps the air moist, which helps to prevent dry skin.  Be aware of any changes in your itchiness. Contact a health care provider if:  The itching does not go away after several days.  You are unusually thirsty or urinating more than normal.  Your skin tingles or feels numb.  Your skin or the white parts of your eyes turn yellow (jaundice).  You feel weak.  You have any of the following: ? Night sweats. ? Tiredness (fatigue). ? Weight loss. ? Abdominal pain. Summary  Pruritus is an itchy feeling on the skin. One of the most common causes is dry skin, but many different conditions and factors can cause itching.  Apply moisturizing lotion to your skin as needed. Lotion that contains petroleum jelly is best.  Take medicines or apply medicated creams only as told by your health care provider.  Do not take hot showers or baths. Do not use scented soaps, detergents, perfumes, or cosmetic products. This information is not intended to replace  advice given to you by your health care provider. Make sure you discuss any questions you have with your health care provider. Document Revised: 12/31/2017 Document Reviewed: 12/31/2017 Elsevier Patient Education  [place] (urticaria) are itchy, red, swollen areas on the skin. Hives can appear on any part of the body. Hives often fade within 24 hours (acute hives). Sometimes, new hives appear after old ones fade and the cycle can continue for several days or weeks (chronic hives). Hives do not spread  from person to person (are not contagious). Hives come from the body's reaction to something a person is allergic to (allergen), something that causes irritation, or various other triggers. When a person is exposed to a trigger, his or her body releases a chemical (histamine) that causes redness, itching, and swelling. Hives can appear right after exposure to a trigger or hours later. What are the causes? This condition may be caused by:  Allergies to foods or ingredients.  Insect bites or stings.  Exposure to pollen or pets.  Contact with latex or chemicals.  Spending time in sunlight, heat, or cold (exposure).  Exercise.  Stress.  Certain medicines. You can also get hives from other medical conditions and treatments, such as:  Viruses, including the common cold.  Bacterial infections, such as urinary tract infections and strep throat.  Certain medicines.  Allergy shots.  Blood transfusions. Sometimes, the cause of this condition is not known (idiopathic hives). What increases the risk? You are more likely to develop this condition if you:  Are a woman.  Have food allergies, especially to citrus fruits, milk, eggs, peanuts, tree nuts, or shellfish.  Are allergic to: ? Medicines. ? Latex. ? Insects. ? Animals. ? Pollen. What are the signs or symptoms? Common symptoms of this condition include raised, itchy, red or white bumps or patches on your skin. These areas may:  Become large and swollen (welts).  Change in shape and location, quickly and repeatedly.  Be separate hives or connect over a large area of skin.  Sting or become painful.  Turn white when pressed in the center (blanch). In severe cases, yourhands, feet, and face may also become swollen. This may occur if hives develop deeper in your skin. How is this diagnosed? This condition may be diagnosed by your symptoms, medical history, and physical exam.  Your skin, urine, or blood may be tested to  find out what is causing your hives and to rule out other health issues.  Your health care provider may also remove a small sample of skin from the affected area and examine it under a microscope (biopsy). How is this treated? Treatment for this condition depends on the cause and severity of your symptoms. Your health care provider may recommend using cool, wet cloths (cool compresses) or taking cool showers to relieve itching. Treatment may include:  Medicines that help: ? Relieve itching (antihistamines). ? Reduce swelling (corticosteroids). ? Treat infection (antibiotics).  An injectable medicine (omalizumab). Your health care provider may prescribe this if you have chronic idiopathic hives and you continue to have symptoms even after treatment with antihistamines. Severe cases may require an emergency injection of adrenaline (epinephrine) to prevent a life-threatening allergic reaction (anaphylaxis). Follow these instructions at home: Medicines  Take and apply over-the-counter and prescription medicines only as told by your health care provider.  If you were prescribed an antibiotic medicine, take it as told by your health care provider. Do not stop using the antibiotic even if  you start to feel better. Skin care  Apply cool compresses to the affected areas.  Do not scratch or rub your skin. General instructions  Do not take hot showers or baths. This can make itching worse.  Do not wear tight-fitting clothing.  Use sunscreen and wear protective clothing when you are outside.  Avoid any substances that cause your hives. Keep a journal to help track what causes your hives. Write down: ? What medicines you take. ? What you eat and drink. ? What products you use on your skin.  Keep all follow-up visits as told by your health care provider. This is important. Contact a health care provider if:  Your symptoms are not controlled with medicine.  Your joints are painful or  swollen. Get help right away if:  You have a fever.  You have pain in your abdomen.  Your tongue or lips are swollen.  Your eyelids are swollen.  Your chest or throat feels tight.  You have trouble breathing or swallowing. These symptoms may represent a serious problem that is an emergency. Do not wait to see if the symptoms will go away. Get medical help right away. Call your local emergency services (911 in the [place] Do not drive yourself to the hospital. Summary  Hives (urticaria) are itchy, red, swollen areas on your skin. Hives come from the body's reaction to something a person is allergic to (allergen), something that causes irritation, or various other triggers.  Treatment for this condition depends on the cause and severity of your symptoms.  Avoid any substances that cause your hives. Keep a journal to help track what causes your hives.  Take and apply over-the-counter and prescription medicines only as told by your health care provider.  Keep all follow-up visits as told by your health care provider. This is important. This information is not intended to replace advice given to you by your health care provider. Make sure you discuss any questions you have with your health care provider. Document Revised: 07/02/2018 Document Reviewed: 07/02/2018 Elsevier Patient Education  [place].

## 2020-11-03 NOTE — Progress Notes (Addendum)
 Established Patient Office Visit  Subjective:  Patient ID: Joyce Simpson, female    DOB: 10/28/1961  Age: 59 y.o. MRN: [id number]  CC:  Chief Complaint  Patient presents with  . Acute Visit    hives    HPI Joyce A Crawleyis a 59 yo who presents for one week history of itchy skin- all over- and when she scratches welts occur and last 5-10 min before resolving. No spontaneous hives.  No lip swelling, wheezing, chest tightness.  No history of allergies in general. No new meds or foods, skin products,detergents.  No one else in the family has had pruritus.  Not have a history of allergic rhinitis.  He has been taking Benadryl 1 TBSP at 9-10 pm and that helps her sleep.   Past Medical History:  Diagnosis Date  . COVID-19    12/19/19  . Diverticulosis   . Heart murmur   . Hyperlipidemia   . Hypertension   . Migraines   . Osteoporosis     Past Surgical History:  Procedure Laterality Date  . ABDOMINAL HYSTERECTOMY     dub 2008/2009 h/o abnormal pap   . BACK SURGERY     cervical spine [place]  . CESAREAN SECTION     x2   . COLONOSCOPY WITH PROPOFOL N/A 04/11/2018   Procedure: COLONOSCOPY WITH PROPOFOL;  Surgeon: Anna, Kiran, MD;  Location: [facility] ENDOSCOPY;  Service: Gastroenterology;  Laterality: N/A;  . COLONOSCOPY WITH PROPOFOL N/A 06/20/2018   Procedure: COLONOSCOPY WITH PROPOFOL;  Surgeon: Anna, Kiran, MD;  Location: [facility] ENDOSCOPY;  Service: Gastroenterology;  Laterality: N/A;    Family History  Problem Relation Age of Onset  . Hypertension Mother   . AAA (abdominal aortic aneurysm) Mother   . Aneurysm Mother   . Cancer Brother        Colon  . Hypertension Brother   . Diabetes Daughter        type 1   . Asthma Sister   . Cancer Brother        colon cancer   . AAA (abdominal aortic aneurysm) Brother   . Anuerysm Brother   . Cancer Maternal Aunt        Breast  . Breast cancer Maternal Aunt   . Cancer Paternal Aunt        Breast  . Breast cancer Paternal  Aunt   . Breast cancer Paternal Aunt   . Cancer Other        breast, lung and colon in 3 different cousings    Social History   Socioeconomic History  . Marital status: Married    Spouse name: Not on file  . Number of children: Not on file  . Years of education: Not on file  . Highest education level: Not on file  Occupational History  . Not on file  Tobacco Use  . Smoking status: Never Smoker  . Smokeless tobacco: Never Used  Substance and Sexual Activity  . Alcohol use: No  . Drug use: No  . Sexual activity: Not on file  Other Topics Concern  . 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 Difficulty of Paying Living Expenses: Not on file  Food Insecurity:   .   Worried About Charity fundraiser in the Last Year: Not on file  . Ran Out of Food in the Last Year: Not on file  Transportation Needs:   . Lack of Transportation (Medical): Not on file  . Lack of Transportation (Non-Medical): Not on file  Physical Activity:   . Days of Exercise per Week: Not on file  . Minutes of Exercise per Session: Not on file  Stress:   . Feeling of Stress : Not on file  Social Connections:   . Frequency of Communication with Friends and Family: Not on file  . Frequency of Social Gatherings with Friends and Family: Not on file  . Attends Religious Services: Not on file  . Active Member of Clubs or Organizations: Not on file  . Attends Archivist Meetings: Not on file  . Marital Status: Not on file  Intimate Partner Violence:   . Fear of Current or Ex-Partner: Not on file  . Emotionally Abused: Not on file  . Physically Abused: Not on file  . Sexually Abused: Not on file    Outpatient Medications Prior to Visit  Medication Sig Dispense Refill  . amLODipine (NORVASC) 5 MG  tablet Take 1 tablet (5 mg total) by mouth daily. 90 tablet 3  . losartan-hydrochlorothiazide (HYZAAR) 50-12.5 MG tablet Take 1 tablet by mouth daily. In am 90 tablet 3  . Cholecalciferol 1.25 MG (50000 UT) capsule Take 1 capsule (50,000 Units total) by mouth once a week. D3 d/c after 6 months (Patient not taking: Reported on 11/03/2020) 13 capsule 1   No facility-administered medications prior to visit.    Allergies  Allergen Reactions  . Fosamax [Alendronate Sodium]     GI upset     Review of Systems Pertinent positives noted in history of present illness and otherwise negative.   Objective:    Physical Exam Vitals reviewed.  Constitutional:      Appearance: She is normal weight.  HENT:     Head: Normocephalic and atraumatic.     Mouth/Throat:     Mouth: Mucous membranes are moist.     Pharynx: Oropharynx is clear.  Eyes:     Pupils: Pupils are equal, round, and reactive to light.  Cardiovascular:     Rate and Rhythm: Normal rate and regular rhythm.     Pulses: Normal pulses.     Heart sounds: Normal heart sounds.  Pulmonary:     Effort: Pulmonary effort is normal.     Breath sounds: Normal breath sounds.  Musculoskeletal:     Cervical back: Normal range of motion.  Skin:    General: Skin is warm and dry.     Findings: No rash.     Comments: No rash, erythema, lesions, dry or irritated skin. No scratch marks. No dermatographia.   Neurological:     General: No focal deficit present.     Mental Status: She is alert and oriented to person, place, and time.  Psychiatric:        Mood and Affect: Mood normal.        Behavior: Behavior normal.     BP 118/72 (BP Location: Left Arm, Patient Position: Sitting, Cuff Size: Normal)   Pulse 73   Temp 97.8 F (36.6 C) (Oral)   Ht 5' 1.61" (1.565 m)   Wt 192 lb (87.1 kg)   SpO2 99%   BMI 35.56 kg/m  Wt Readings from Last 3 Encounters:  11/03/20 192 lb (87.1 kg)  08/02/20 187 lb  12.8 oz (85.2 kg)  07/14/20 192 lb 9.6 oz  (87.4 kg)   Pulse Readings from Last 3 Encounters:  11/03/20 73  08/02/20 80  07/14/20 71    BP Readings from Last 3 Encounters:  11/03/20 118/72  08/02/20 124/84  07/14/20 140/84    Lab Results  Component Value Date   CHOL 196 08/02/2020   HDL 60.50 08/02/2020   LDLCALC 123 (H) 08/02/2020   TRIG 61.0 08/02/2020   CHOLHDL 3 08/02/2020      Health Maintenance Due  Topic Date Due  . INFLUENZA VACCINE  Never done    There are no preventive care reminders to display for this patient.  Lab Results  Component Value Date   TSH 1.10 08/02/2020   Lab Results  Component Value Date   WBC 4.7 08/02/2020   HGB 14.0 08/02/2020   HCT 40.7 08/02/2020   MCV 83.7 08/02/2020   PLT 239.0 08/02/2020   Lab Results  Component Value Date   NA 138 08/02/2020   K 3.7 08/02/2020   CO2 27 08/02/2020   GLUCOSE 97 08/02/2020   BUN 11 08/02/2020   CREATININE 0.86 08/02/2020   BILITOT 0.7 08/02/2020   ALKPHOS 76 08/02/2020   AST 14 08/02/2020   ALT 13 08/02/2020   PROT 6.9 08/02/2020   ALBUMIN 4.1 08/02/2020   CALCIUM 9.3 08/02/2020   ANIONGAP 8 01/20/2017   GFR 81.56 08/02/2020   Lab Results  Component Value Date   CHOL 196 08/02/2020   Lab Results  Component Value Date   HDL 60.50 08/02/2020   Lab Results  Component Value Date   LDLCALC 123 (H) 08/02/2020   Lab Results  Component Value Date   TRIG 61.0 08/02/2020   Lab Results  Component Value Date   CHOLHDL 3 08/02/2020   Lab Results  Component Value Date   HGBA1C 5.6 08/02/2020      Assessment & Plan:   Problem List Items Addressed This Visit      Musculoskeletal and Integument   Pruritus - Primary   Relevant Orders   Ambulatory referral to Allergy   CBC with Differential/Platelet   Comp Met (CMET)   Hives   Relevant Orders   Ambulatory referral to Allergy   CBC with Differential/Platelet   Comp Met (CMET)      Meds ordered this encounter  Medications  . cetirizine (ZYRTEC) 10 MG tablet     Sig: Take 1 tablet (10 mg total) by mouth daily.    Dispense:  30 tablet    Refill:  1    Order Specific Question:   Supervising Provider    Answer:   SCOTT, CHARLENE [[id number]]    She has pruritus.  When she starts scratching, she develops small welts.  There is no rash or hives noted on the body.  Will check labs to make sure she does not have elevated alkaline phosphatase.  No angioedema.  She can identified no allergic items.  Will request Allergy consultation.  Patient advised: Please go to the lab today.  Over the counter Zyrtec 10 mg one pill every day. OK to use  Benadryl for sleep only.   Referral  placed to Allergist.   Follow up if no improvement.   To Emergency if you get lip swelling or trouble swallowing- see below  Contact a health care provider if:  Your symptoms are not controlled with medicine.  Your joints are painful or swollen. Get help right away if:  You have   a fever.  You have pain in your abdomen.  Your tongue or lips are swollen.  Your eyelids are swollen.  Your chest or throat feels tight. You have trouble breathing or swallowing.  Follow-up: No follow-ups on file.  This visit occurred during the SARS-CoV-2 public health emergency.  Safety protocols were in place, including screening questions prior to the visit, additional usage of staff PPE, and extensive cleaning of exam room while observing appropriate contact time as indicated for disinfecting solutions.    Kimberly Mills, NP 

## 2020-11-04 LAB — COMPREHENSIVE METABOLIC PANEL
ALT: 15 U/L (ref 0–35)
AST: 14 U/L (ref 0–37)
Albumin: 4.3 g/dL (ref 3.5–5.2)
Alkaline Phosphatase: 91 U/L (ref 39–117)
BUN: 13 mg/dL (ref 6–23)
CO2: 29 mEq/L (ref 19–32)
Calcium: 9.4 mg/dL (ref 8.4–10.5)
Chloride: 102 mEq/L (ref 96–112)
Creatinine, Ser: 0.79 mg/dL (ref 0.40–1.20)
GFR: 81.63 mL/min (ref >60.00)
Glucose, Bld: 80 mg/dL (ref 70–99)
Potassium: 3.8 mEq/L (ref 3.5–5.1)
Sodium: 139 mEq/L (ref 135–145)
Total Bilirubin: 0.4 mg/dL (ref 0.2–1.2)
Total Protein: 6.9 g/dL (ref 6.0–8.3)

## 2020-11-04 LAB — CBC WITH DIFFERENTIAL/PLATELET
Basophils Absolute: 0 10*3/uL (ref 0.0–0.1)
Basophils Relative: 0.6 % (ref 0.0–3.0)
Eosinophils Absolute: 0.1 10*3/uL (ref 0.0–0.7)
Eosinophils Relative: 2.3 % (ref 0.0–5.0)
HCT: 42.6 % (ref 36.0–46.0)
Hemoglobin: 14.2 g/dL (ref 12.0–15.0)
Lymphocytes Relative: 35.8 % (ref 12.0–46.0)
Lymphs Abs: 2.1 10*3/uL (ref 0.7–4.0)
MCHC: 33.4 g/dL (ref 30.0–36.0)
MCV: 83.7 fl (ref 78.0–100.0)
Monocytes Absolute: 0.6 10*3/uL (ref 0.1–1.0)
Monocytes Relative: 11.1 % (ref 3.0–12.0)
Neutro Abs: 2.9 10*3/uL (ref 1.4–7.7)
Neutrophils Relative %: 50.2 % (ref 43.0–77.0)
Platelets: 261 10*3/uL (ref 150.0–400.0)
RBC: 5.09 Mil/uL (ref 3.87–5.11)
RDW: 13.6 % (ref 11.5–15.5)
WBC: 5.7 10*3/uL (ref 4.0–10.5)

## 2020-11-17 ENCOUNTER — Encounter: Payer: Self-pay | Admitting: Internal Medicine

## 2020-11-17 ENCOUNTER — Other Ambulatory Visit: Payer: Self-pay

## 2020-11-17 ENCOUNTER — Telehealth (INDEPENDENT_AMBULATORY_CARE_PROVIDER_SITE_OTHER): Payer: 59 | Admitting: Internal Medicine

## 2020-11-17 ENCOUNTER — Other Ambulatory Visit: Payer: Self-pay | Admitting: Internal Medicine

## 2020-11-17 VITALS — Ht 61.61 in | Wt 190.0 lb

## 2020-11-17 DIAGNOSIS — E559 Vitamin D deficiency, unspecified: Secondary | ICD-10-CM

## 2020-11-17 DIAGNOSIS — J0110 Acute frontal sinusitis, unspecified: Secondary | ICD-10-CM

## 2020-11-17 MED ORDER — CETIRIZINE HCL 10 MG PO TABS: 10.0000 mg | ORAL_TABLET | Freq: Every day | ORAL | 3 refills | Status: DC | PRN

## 2020-11-17 MED ORDER — FLUTICASONE PROPIONATE 50 MCG/ACT NA SUSP: 2.0000 | Freq: Every day | NASAL | 6 refills | Status: DC

## 2020-11-17 MED ORDER — SALINE SPRAY 0.65 % NA SOLN: 2.0000 | NASAL | 11 refills | Status: DC | PRN

## 2020-11-17 MED ORDER — CHOLECALCIFEROL 1.25 MG (50000 UT) PO CAPS: 50000.0000 [IU] | ORAL_CAPSULE | ORAL | 1 refills | Status: DC

## 2020-11-17 MED ORDER — AZITHROMYCIN 250 MG PO TABS: ORAL_TABLET | ORAL | 0 refills | Status: DC

## 2020-11-17 NOTE — Progress Notes (Signed)
Onset of symptoms was last Friday. Cough and sinus issues no one around her sick. Dry cough and nasal congestion/pressure. Mucous is yellowish clear.   Has not been tested for COVID.

## 2020-11-17 NOTE — Progress Notes (Signed)
Virtual Visit via Video Note  I connected with Joyce Simpson  on 11/17/20 at 10:30 AM EST by a video enabled telemedicine application and verified that I am speaking with the correct person using two identifiers.  Location patient: home, [place] Location provider:work or home office Persons participating in the virtual visit: patient, provider  I discussed the limitations of evaluation and management by telemedicine and the availability of in person appointments. The patient expressed understanding and agreed to proceed.   HPI: 1. Sinus pressure and pain with congestion around eyes and nose since Friday no sob, fever, body aches, cough just nl cough with phelgm in the am. No sick contacts and had 3/3 covid vaccines her grandson is in school but feeling well Tried otc meds [place]   ROS: See pertinent positives and negatives per HPI.  Past Medical History:  Diagnosis Date  . COVID-19    12/19/19  . Diverticulosis   . Heart murmur   . Hyperlipidemia   . Hypertension   . Migraines   . Osteoporosis     Past Surgical History:  Procedure Laterality Date  . ABDOMINAL HYSTERECTOMY     dub 2008/2009 h/o abnormal pap   . BACK SURGERY     cervical spine [place]  . CESAREAN SECTION     x2   . COLONOSCOPY WITH PROPOFOL N/A 04/11/2018   Procedure: COLONOSCOPY WITH PROPOFOL;  Surgeon: Jonathon Bellows, MD;  Location: [facility] ENDOSCOPY;  Service: Gastroenterology;  Laterality: N/A;  . COLONOSCOPY WITH PROPOFOL N/A 06/20/2018   Procedure: COLONOSCOPY WITH PROPOFOL;  Surgeon: Jonathon Bellows, MD;  Location: [facility] ENDOSCOPY;  Service: Gastroenterology;  Laterality: N/A;     Current Outpatient Medications:  .  amLODipine (NORVASC) 5 MG tablet, Take 1 tablet (5 mg total) by mouth daily., Disp: 90 tablet, Rfl: 3 .  losartan-hydrochlorothiazide (HYZAAR) 50-12.5 MG tablet, Take 1 tablet by mouth daily. In am, Disp: 90 tablet, Rfl: 3 .  azithromycin (ZITHROMAX) 250 MG tablet, 2 pills day 1 and 1 pill day  2-5 with food, Disp: 6 tablet, Rfl: 0 .  cetirizine (ZYRTEC) 10 MG tablet, Take 1 tablet (10 mg total) by mouth daily as needed for allergies., Disp: 90 tablet, Rfl: 3 .  Cholecalciferol 1.25 MG (50000 UT) capsule, Take 1 capsule (50,000 Units total) by mouth once a week. D3 d/c after 6 months (Patient not taking: Reported on 11/17/2020), Disp: 13 capsule, Rfl: 1 .  fluticasone (FLONASE) 50 MCG/ACT nasal spray, Place 2 sprays into both nostrils daily., Disp: 16 g, Rfl: 6 .  sodium chloride (OCEAN) 0.65 % SOLN nasal spray, Place 2 sprays into both nostrils as needed for congestion., Disp: 30 mL, Rfl: 11  EXAM:  VITALS per patient if applicable:  GENERAL: alert, oriented, appears well and in no acute distress  HEENT: atraumatic, conjunttiva clear, no obvious abnormalities on inspection of external nose and ears  NECK: normal movements of the head and neck  LUNGS: on inspection no signs of respiratory distress, breathing rate appears normal, no obvious gross SOB, gasping or wheezing  CV: no obvious cyanosis  MS: moves all visible extremities without noticeable abnormality  PSYCH/NEURO: pleasant and cooperative, no obvious depression or anxiety, speech and thought processing grossly intact  ASSESSMENT AND PLAN:  Discussed the following assessment and plan:  Acute frontal sinusitis, recurrence not specified - Plan: fluticasone (FLONASE) 50 MCG/ACT nasal spray, sodium chloride (OCEAN) 0.65 % SOLN nasal spray, azithromycin (ZITHROMAX) 250 MG tablet, cetirizine (ZYRTEC) 10 MG tablet Call back if  not feeling well Friday   Vitamin D deficiency - Plan: Cholecalciferol 1.25 MG (50000 UT) capsule weekly x 6 months  HM Tdaputd Pfizer 2/2 Hep c neg Hep B vaccines 3/3immune  MMR immune covid vaccines 3/3 mammoneg2/10/21 negordered 2022  DEXA 06/17/17 +osteoporosisdue again 03/04/2019 -d/c fosamax today try to get prolia approvedagain disc at f/u could not tolerate  fosamax  05/01/17 pap neg neg HPV s/p hysterectomy DUB and h/o abnormal pap sees Dr. [facility]itt [facility] -pap today  [facility] EGD and colonoscopy had <50 and >5 years need to get records. -With [place] 2 brothers colon cancer refer to Dr. Cherlyn Labella has appt colonoscopy4/12/19 Dr. Audria Nine MDs -colonoscopy had 04/11/18 polyp and repeat 06/20/18 polpy +tubular adenoma given size [facility] [place] [facility] rec repeat in 3 years  rec D3 2000 IU qd rec healthy diet and exercise    -we discussed possible serious and likely etiologies, options for evaluation and workup, limitations of telemedicine visit vs in person visit, treatment, treatment risks and precautions.   I discussed the assessment and treatment plan with the patient. The patient was provided an opportunity to ask questions and all were answered. The patient agreed with the plan and demonstrated an understanding of the instructions.    Time spent 20 min Delorise Jackson, MD

## 2020-11-18 ENCOUNTER — Telehealth: Payer: Self-pay | Admitting: Internal Medicine

## 2020-11-18 ENCOUNTER — Telehealth: Payer: Self-pay

## 2020-11-18 NOTE — Telephone Encounter (Signed)
Noted  For your information   

## 2020-11-18 NOTE — Telephone Encounter (Signed)
Noted  TMS 

## 2020-11-18 NOTE — Telephone Encounter (Signed)
Spoke with patient and she is doing fine. She has not had any hives.

## 2020-11-18 NOTE — Telephone Encounter (Signed)
Patient wanted Dr Olivia Mackie to know she is feeling better and has started the medication that Dr. Olivia Mackie prescribed.

## 2021-03-08 DIAGNOSIS — H524 Presbyopia: Secondary | ICD-10-CM | POA: Diagnosis not present

## 2021-04-19 ENCOUNTER — Other Ambulatory Visit: Payer: Self-pay

## 2021-04-19 ENCOUNTER — Ambulatory Visit
Admission: RE | Admit: 2021-04-19 | Discharge: 2021-04-19 | Disposition: A | Discharge status: Home / Self Care | Payer: 59 | Source: Ambulatory Visit | Attending: Internal Medicine | Admitting: Internal Medicine

## 2021-04-19 DIAGNOSIS — Z1231 Encounter for screening mammogram for malignant neoplasm of breast: Secondary | ICD-10-CM | POA: Insufficient documentation

## 2021-04-24 ENCOUNTER — Telehealth: Payer: Self-pay | Admitting: Internal Medicine

## 2021-04-24 ENCOUNTER — Other Ambulatory Visit: Payer: Self-pay

## 2021-04-24 DIAGNOSIS — I10 Essential (primary) hypertension: Secondary | ICD-10-CM

## 2021-04-24 MED ORDER — AMLODIPINE BESYLATE 5 MG PO TABS
5.0000 mg | ORAL_TABLET | Freq: Every day | ORAL | 3 refills | Status: DC
  Filled 2021-04-24: qty 90, 90d supply, fill #0

## 2021-04-24 NOTE — Telephone Encounter (Signed)
Patient called in for refill for amLODipine (NORVASC) 5 MG tablet 

## 2021-05-30 ENCOUNTER — Other Ambulatory Visit: Payer: Self-pay

## 2021-05-30 MED ORDER — AMOXICILLIN 500 MG PO CAPS
ORAL_CAPSULE | ORAL | 0 refills | Status: DC
Start: 2021-05-30 — End: 2021-08-04
  Filled 2021-05-30: qty 12, 3d supply, fill #0

## 2021-06-30 ENCOUNTER — Other Ambulatory Visit: Payer: Self-pay

## 2021-06-30 MED FILL — Losartan Potassium & Hydrochlorothiazide Tab 50-12.5 MG: ORAL | 90 days supply | Qty: 90 | Fill #0 | Status: AC

## 2021-08-04 ENCOUNTER — Ambulatory Visit (INDEPENDENT_AMBULATORY_CARE_PROVIDER_SITE_OTHER): Payer: 59 | Admitting: Internal Medicine

## 2021-08-04 ENCOUNTER — Other Ambulatory Visit: Payer: Self-pay

## 2021-08-04 ENCOUNTER — Encounter: Payer: Self-pay | Admitting: Internal Medicine

## 2021-08-04 VITALS — BP 132/92 | HR 69 | Temp 98.3°F | Ht 61.61 in | Wt 188.2 lb

## 2021-08-04 DIAGNOSIS — E559 Vitamin D deficiency, unspecified: Secondary | ICD-10-CM | POA: Diagnosis not present

## 2021-08-04 DIAGNOSIS — Z1329 Encounter for screening for other suspected endocrine disorder: Secondary | ICD-10-CM | POA: Diagnosis not present

## 2021-08-04 DIAGNOSIS — Z1389 Encounter for screening for other disorder: Secondary | ICD-10-CM | POA: Diagnosis not present

## 2021-08-04 DIAGNOSIS — R252 Cramp and spasm: Secondary | ICD-10-CM | POA: Diagnosis not present

## 2021-08-04 DIAGNOSIS — Z0000 Encounter for general adult medical examination without abnormal findings: Secondary | ICD-10-CM | POA: Diagnosis not present

## 2021-08-04 DIAGNOSIS — R739 Hyperglycemia, unspecified: Secondary | ICD-10-CM | POA: Diagnosis not present

## 2021-08-04 DIAGNOSIS — I10 Essential (primary) hypertension: Secondary | ICD-10-CM | POA: Diagnosis not present

## 2021-08-04 LAB — COMPREHENSIVE METABOLIC PANEL
ALT: 14 U/L (ref 0–35)
AST: 14 U/L (ref 0–37)
Albumin: 4 g/dL (ref 3.5–5.2)
Alkaline Phosphatase: 77 U/L (ref 39–117)
BUN: 12 mg/dL (ref 6–23)
CO2: 27 mEq/L (ref 19–32)
Calcium: 9.2 mg/dL (ref 8.4–10.5)
Chloride: 105 mEq/L (ref 96–112)
Creatinine, Ser: 0.82 mg/dL (ref 0.40–1.20)
GFR: 77.65 mL/min (ref >60.00)
Glucose, Bld: 88 mg/dL (ref 70–99)
Potassium: 3.5 mEq/L (ref 3.5–5.1)
Sodium: 139 mEq/L (ref 135–145)
Total Bilirubin: 0.5 mg/dL (ref 0.2–1.2)
Total Protein: 7 g/dL (ref 6.0–8.3)

## 2021-08-04 LAB — CBC WITH DIFFERENTIAL/PLATELET
Basophils Absolute: 0 10*3/uL (ref 0.0–0.1)
Basophils Relative: 0.4 % (ref 0.0–3.0)
Eosinophils Absolute: 0.1 10*3/uL (ref 0.0–0.7)
Eosinophils Relative: 2.2 % (ref 0.0–5.0)
HCT: 41.3 % (ref 36.0–46.0)
Hemoglobin: 13.6 g/dL (ref 12.0–15.0)
Lymphocytes Relative: 33.1 % (ref 12.0–46.0)
Lymphs Abs: 1.5 10*3/uL (ref 0.7–4.0)
MCHC: 32.8 g/dL (ref 30.0–36.0)
MCV: 84.6 fl (ref 78.0–100.0)
Monocytes Absolute: 0.4 10*3/uL (ref 0.1–1.0)
Monocytes Relative: 9.5 % (ref 3.0–12.0)
Neutro Abs: 2.4 10*3/uL (ref 1.4–7.7)
Neutrophils Relative %: 54.8 % (ref 43.0–77.0)
Platelets: 227 10*3/uL (ref 150.0–400.0)
RBC: 4.89 Mil/uL (ref 3.87–5.11)
RDW: 13.4 % (ref 11.5–15.5)
WBC: 4.4 10*3/uL (ref 4.0–10.5)

## 2021-08-04 LAB — LIPID PANEL
Cholesterol: 177 mg/dL (ref 0–200)
HDL: 56.8 mg/dL (ref >39.00)
LDL Cholesterol: 109 mg/dL — ABNORMAL HIGH (ref 0–99)
NonHDL: 120.07
Total CHOL/HDL Ratio: 3
Triglycerides: 56 mg/dL (ref 0.0–149.0)
VLDL: 11.2 mg/dL (ref 0.0–40.0)

## 2021-08-04 LAB — VITAMIN D 25 HYDROXY (VIT D DEFICIENCY, FRACTURES): VITD: 46.09 ng/mL (ref 30.00–100.00)

## 2021-08-04 LAB — TSH: TSH: 1.99 u[IU]/mL (ref 0.35–5.50)

## 2021-08-04 LAB — MAGNESIUM: Magnesium: 2.1 mg/dL (ref 1.5–2.5)

## 2021-08-04 LAB — HEMOGLOBIN A1C: Hgb A1c MFr Bld: 5.7 % (ref 4.6–6.5)

## 2021-08-04 MED ORDER — LOSARTAN POTASSIUM-HCTZ 50-12.5 MG PO TABS
1.0000 | ORAL_TABLET | Freq: Every morning | ORAL | 3 refills | Status: DC
  Filled 2021-08-04 – 2021-10-16 (×2): qty 90, 90d supply, fill #0
  Filled 2022-02-19: qty 90, 90d supply, fill #1
  Filled 2022-05-30: qty 90, 90d supply, fill #2

## 2021-08-04 MED ORDER — AMLODIPINE BESYLATE 5 MG PO TABS
5.0000 mg | ORAL_TABLET | Freq: Every day | ORAL | 3 refills | Status: DC
  Filled 2021-08-04: qty 90, 90d supply, fill #0
  Filled 2021-12-12: qty 90, 90d supply, fill #1
  Filled 2022-04-03: qty 90, 90d supply, fill #2
  Filled 2022-07-11: qty 90, 90d supply, fill #3

## 2021-08-04 NOTE — Patient Instructions (Addendum)
Hydrate 55-64 ounces day  [facility]   GI Dr. Bailey Mech MD Physician    Primary Contact Information  Phone Fax E-mail Address  [phone]  [place]   [place] [address]     Specialties       Leg Cramps Leg cramps occur when one or more muscles tighten and a person has no control over it (involuntary muscle contraction). Muscle cramps are most common in the calf muscles of the leg. They can occur during exercise or at rest. Leg cramps are painful, and they may last for a few seconds to a few minutes. Cramps may return several times before they finallystop. Usually, leg cramps are not caused by a serious medical problem. In many cases, the cause is not known. Some common causes include: Excessive physical effort (overexertion), such as during intense exercise. Doing the same motion over and over. Staying in a certain position for a long period of time. Improper preparation, form, or technique while doing a sport or an activity. Dehydration. Injury. Side effects of certain medicines. Abnormally low levels of minerals in your blood (electrolytes), especially potassium and calcium. This could result from: Pregnancy. Taking diuretic medicines. Follow these instructions at home: Eating and drinking Drink enough fluid to keep your urine pale yellow. Staying hydrated may help prevent cramps. Eat a healthy diet that includes plenty of nutrients to help your muscles function. A healthy diet includes fruits and vegetables, lean protein, whole grains, and low-fat or nonfat dairy products. Managing pain, stiffness, and swelling     Try massaging, stretching, and relaxing the affected muscle. Do this for several minutes at a time. If directed, put ice on areas that are sore or painful after a cramp. To do this: Put ice in a plastic bag. Place a towel between your skin and the bag. Leave the ice on for 20 minutes, 2-3 times a day. Remove the ice if your  skin turns bright red. This is very important. If you cannot feel pain, heat, or cold, you have a greater risk of damage to the area. If directed, apply heat to muscles that are tense or tight. Do this before you exercise, or as often as told by your health care provider. Use the heat source that your health care provider recommends, such as a moist heat pack or a heating pad. To do this: Place a towel between your skin and the heat source. Leave the heat on for 20-30 minutes. Remove the heat if your skin turns bright red. This is especially important if you are unable to feel pain, heat, or cold. You may have a greater risk of getting burned. Try taking hot showers or baths to help relax tight muscles. General instructions If you are having frequent leg cramps, avoid intense exercise for several days. Take over-the-counter and prescription medicines only as told by your health care provider. Keep all follow-up visits. This is important. Contact a health care provider if: Your leg cramps get more severe or more frequent, or they do not improve over time. Your foot becomes cold, numb, or blue. Summary Muscle cramps can develop in any muscle, but the most common place is in the calf muscles of the leg. Leg cramps are painful, and they may last for a few seconds to a few minutes. Usually, leg cramps are not caused by a serious medical problem. Often, the cause is not known. Stay hydrated, and take over-the-counter and prescription medicines only as told  by your health care provider. This information is not intended to replace advice given to you by your health care provider. Make sure you discuss any questions you have with your healthcare provider. Document Revised: 05/04/2020 Document Reviewed: 05/04/2020 Elsevier Patient Education  [place].

## 2021-08-04 NOTE — Progress Notes (Addendum)
Chief Complaint  Patient presents with   Annual Exam   Annual  1. Htn norvasc 5 mg, hyzaar 50-12.5 mg qd not had meds today   Review of Systems  Constitutional:  Negative for weight loss.  HENT:  Negative for hearing loss.   Eyes:  Negative for blurred vision.  Respiratory:  Negative for shortness of breath.   Cardiovascular:  Negative for chest pain.  Gastrointestinal:  Negative for constipation.  Musculoskeletal:  Negative for falls and joint pain.  Skin:  Negative for rash.  Neurological:  Negative for headaches.  Psychiatric/Behavioral:  Negative for depression.   Past Medical History:  Diagnosis Date   COVID-19    12/19/19   Diverticulosis    Heart murmur    Hyperlipidemia    Hypertension    Migraines    Osteoporosis    Past Surgical History:  Procedure Laterality Date   ABDOMINAL HYSTERECTOMY     dub 2008/2009 h/o abnormal pap    BACK SURGERY     cervical spine [place] NS   CESAREAN SECTION     x2    COLONOSCOPY WITH PROPOFOL N/A 04/11/2018   Procedure: COLONOSCOPY WITH PROPOFOL;  Surgeon: Jonathon Bellows, MD;  Location: [facility] ENDOSCOPY;  Service: Gastroenterology;  Laterality: N/A;   COLONOSCOPY WITH PROPOFOL N/A 06/20/2018   Procedure: COLONOSCOPY WITH PROPOFOL;  Surgeon: Jonathon Bellows, MD;  Location: [facility] ENDOSCOPY;  Service: Gastroenterology;  Laterality: N/A;   Family History  Problem Relation Age of Onset   Hypertension Mother    AAA (abdominal aortic aneurysm) Mother    Aneurysm Mother    Cancer Brother        Colon   Hypertension Brother    Diabetes Daughter        type 1    Asthma Sister    Cancer Brother        colon cancer    AAA (abdominal aortic aneurysm) Brother    Anuerysm Brother    Cancer Maternal Aunt        Breast   Breast cancer Maternal Aunt    Cancer Paternal Aunt        Breast   Breast cancer Paternal Aunt    Breast cancer Paternal Aunt    Cancer Other        breast, lung and colon in 3 different cousings   Social History    Socioeconomic History   Marital status: Married    Spouse name: Not on file   Number of children: Not on file   Years of education: Not on file   Highest education level: Not on file  Occupational History   Not on file  Tobacco Use   Smoking status: Never   Smokeless tobacco: Never  Substance and Sexual Activity   Alcohol use: No   Drug use: No   Sexual activity: Not on file  Other Topics Concern   Not on file  Social History Narrative   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   [DISCONTINUED] fluticasone (FLONASE) 50 MCG/ACT nasal spray PLACE 2 SPRAYS INTO BOTH NOSTRILS DAILY   [DISCONTINUED] losartan-hydrochlorothiazide (HYZAAR) 50-12.5 MG tablet TAKE 1 TABLET BY MOUTH DAILY IN THE MORNING   Allergies  Allergen Reactions   Fosamax [Alendronate Sodium]     GI upset    No results found for this or any previous visit (from the past 2160 hour(s)). Objective  Body mass index is 34.85 kg/m. Wt Readings from Last 3 Encounters:  08/04/21 188 lb 3.2 oz (85.4 kg)  11/17/20 190 lb (86.2 kg)  11/03/20 192 lb (87.1 kg)   Temp Readings from Last 3 Encounters:  08/04/21 98.3 F (36.8 C)  11/03/20 97.8 F (36.6 C) (Oral)  08/02/20 98.1 F (36.7 C) (Oral)   BP Readings from Last 3 Encounters:  08/04/21 (!) 132/92  11/03/20 118/72  08/02/20 124/84   Pulse Readings from Last 3 Encounters:  08/04/21 69  11/03/20 73  08/02/20 80    Physical Exam Vitals and nursing  note reviewed.  Constitutional:      Appearance: Normal appearance. She is well-developed and well-groomed. She is obese.  HENT:     Head: Normocephalic and atraumatic.  Eyes:     Conjunctiva/sclera: Conjunctivae normal.     Pupils: Pupils are equal, round, and reactive to light.  Cardiovascular:     Rate and Rhythm: Normal rate and regular rhythm.     Heart sounds: Normal heart sounds. No murmur heard. Pulmonary:     Effort: Pulmonary effort is normal.     Breath sounds: Normal breath sounds.  Abdominal:     Tenderness: There is no abdominal tenderness.  Skin:    General: Skin is warm and dry.  Neurological:     General: No focal deficit present.     Mental Status: She is alert and oriented to person, place, and time. Mental status is at baseline.     Gait: Gait normal.  Psychiatric:        Attention and Perception: Attention and perception normal.        Mood and Affect: Mood and affect normal.        Speech: Speech normal.        Behavior: Behavior normal. Behavior is cooperative.        Thought Content: Thought content normal.        Cognition and Memory: Cognition and memory normal.        Judgment: Judgment normal.    Assessment  Plan  Annual physical exam - Plan: Comprehensive metabolic panel, Lipid panel, CBC w/Diff Tdap utd Pfizer 4/4 Hep c neg  Hep B vaccines 3/3 immune  MMR immune  covid vaccines 3/3 mammo neg 02/10/20 neg ordered 03/2021 neg   DEXA 06/17/17 +osteoporosis due again 03/04/2019  -d/c fosamax today try to get prolia approved again disc at f/u could not tolerate fosamax   05/01/17 pap neg neg HPV s/p hysterectomy DUB and h/o abnormal pap sees Dr. Everitt [facility]  -pap 07/2020 neg due 08/2023    [facility] EGD and colonoscopy had <50 and >5 years need to get records.  -With [place] 2 brothers colon cancer refer to Dr. Allen Norris pt has appt colonoscopy 04/11/18 Dr. Audria Nine MDs  -colonoscopy had 04/11/18 polyp and repeat 06/20/18 polpy +tubular adenoma given size GI  [place] rec repeat in 3 years   -due contacted 08/03/21 Dr. Bailey Mech   rec D3  2000 IU qd  rec healthy diet and exercise  [facility] had eye exam 2022   Hypertension, elevated not had meds today - Plan: Comprehensive metabolic panel, Lipid panel, CBC w/Diff On norvasc 5 mg qd and hyzaar 50-12.5 mg qd   Leg cramps - Plan: Comprehensive metabolic panel, TSH, Magnesium Hydrate       Provider: Dr. Olivia Mackie McLean-Scocuzza-Internal Medicine

## 2021-08-05 LAB — URINALYSIS, ROUTINE W REFLEX MICROSCOPIC
Bilirubin Urine: NEGATIVE
Glucose, UA: NEGATIVE
Hgb urine dipstick: NEGATIVE
Ketones, ur: NEGATIVE
Leukocytes,Ua: NEGATIVE
Nitrite: NEGATIVE
Protein, ur: NEGATIVE
Specific Gravity, Urine: 1.023 (ref 1.001–1.035)
pH: 5.5 (ref 5.0–8.0)

## 2021-08-07 ENCOUNTER — Other Ambulatory Visit: Payer: Self-pay

## 2021-08-07 ENCOUNTER — Other Ambulatory Visit: Payer: Self-pay | Admitting: Internal Medicine

## 2021-08-07 DIAGNOSIS — E785 Hyperlipidemia, unspecified: Secondary | ICD-10-CM

## 2021-08-07 MED ORDER — PRAVASTATIN SODIUM 20 MG PO TABS
20.0000 mg | ORAL_TABLET | Freq: Every day | ORAL | 3 refills | Status: DC
Start: 2021-08-07 — End: 2022-08-31
  Filled 2021-08-07: qty 90, 90d supply, fill #0

## 2021-08-10 ENCOUNTER — Telehealth: Payer: Self-pay

## 2021-08-10 ENCOUNTER — Other Ambulatory Visit: Payer: Self-pay

## 2021-08-10 DIAGNOSIS — Z86010 Personal history of colonic polyps: Secondary | ICD-10-CM

## 2021-08-10 MED ORDER — CLENPIQ 10-3.5-12 MG-GM -GM/160ML PO SOLN
ORAL | 0 refills | Status: DC
Start: 2021-08-10 — End: 2022-02-28
  Filled 2021-08-10: qty 320, 1d supply, fill #0

## 2021-08-10 NOTE — Telephone Encounter (Signed)
Called patient and she agreed on doing her colonoscopy on 09/22/2021 with Dr. Vicente Males. I told her the instructions and that I would be mailing it to her. Patient agreed and had no further questions.

## 2021-08-10 NOTE — Telephone Encounter (Signed)
-----   Message from Jonathon Bellows, MD sent at 08/04/2021  8:03 AM EDT ----- Herb Grays  Can you help schedule this patient for colonoscopy please   Regards Kiran  ----- Message ----- From: McLean-Scocuzza, Nino Glow, MD Sent: 08/04/2021   7:47 AM EDT To: Jonathon Bellows, MD  Pt due colonoscopy can your office reach out to her? Thank you

## 2021-08-22 ENCOUNTER — Telehealth: Payer: Self-pay | Admitting: Internal Medicine

## 2021-08-22 NOTE — Telephone Encounter (Signed)
Trying to get Prolia approved authorization in process third attempt.

## 2021-09-01 NOTE — Telephone Encounter (Signed)
Martyn [place]: [id number] - [place] Case ID: [id number]

## 2021-09-06 NOTE — Telephone Encounter (Signed)
The request has been approved. The authorization is effective for a maximum of 2 fills from 09/01/2021 to 08/31/2022, as long as the member is enrolled in their current health plan. The request was approved as submitted. This request is approved for 2 fills per 12 months with a quantity limit of 50m (1pre-filled syringe) per fill. A written notification letter will follow with additional details.

## 2021-09-21 ENCOUNTER — Encounter: Payer: Self-pay | Admitting: Gastroenterology

## 2021-09-22 ENCOUNTER — Ambulatory Visit
Admission: RE | Admit: 2021-09-22 | Discharge: 2021-09-22 | Disposition: A | Discharge status: Home / Self Care | Payer: 59 | Attending: Gastroenterology | Admitting: Gastroenterology

## 2021-09-22 ENCOUNTER — Ambulatory Visit: Payer: 59 | Admitting: Anesthesiology

## 2021-09-22 ENCOUNTER — Encounter: Payer: Self-pay | Admitting: Gastroenterology

## 2021-09-22 ENCOUNTER — Encounter
Admission: RE | Disposition: A | Discharge status: Home / Self Care | Payer: Self-pay | Source: Home / Self Care | Attending: Gastroenterology

## 2021-09-22 DIAGNOSIS — Z86010 Personal history of colonic polyps: Secondary | ICD-10-CM | POA: Insufficient documentation

## 2021-09-22 DIAGNOSIS — E785 Hyperlipidemia, unspecified: Secondary | ICD-10-CM | POA: Diagnosis not present

## 2021-09-22 DIAGNOSIS — Z800 Family history of malignant neoplasm of digestive organs: Secondary | ICD-10-CM | POA: Insufficient documentation

## 2021-09-22 DIAGNOSIS — Z1211 Encounter for screening for malignant neoplasm of colon: Secondary | ICD-10-CM | POA: Diagnosis not present

## 2021-09-22 HISTORY — PX: COLONOSCOPY WITH PROPOFOL: SHX5780

## 2021-09-22 SURGERY — COLONOSCOPY WITH PROPOFOL
Anesthesia: General

## 2021-09-22 MED ORDER — PROPOFOL 500 MG/50ML IV EMUL
INTRAVENOUS | Status: AC
  Filled 2021-09-22: qty 50

## 2021-09-22 MED ORDER — PROPOFOL 10 MG/ML IV BOLUS
INTRAVENOUS | Status: DC | PRN
  Administered 2021-09-22: 50 mg via INTRAVENOUS

## 2021-09-22 MED ORDER — PROPOFOL 500 MG/50ML IV EMUL
INTRAVENOUS | Status: DC | PRN
  Administered 2021-09-22: 50 ug/kg/min via INTRAVENOUS

## 2021-09-22 MED ORDER — MIDAZOLAM HCL 2 MG/2ML IJ SOLN
INTRAMUSCULAR | Status: DC | PRN
  Administered 2021-09-22: 2 mg via INTRAVENOUS

## 2021-09-22 MED ORDER — MIDAZOLAM HCL 2 MG/2ML IJ SOLN
INTRAMUSCULAR | Status: AC
  Filled 2021-09-22: qty 2

## 2021-09-22 MED ORDER — SODIUM CHLORIDE 0.9 % IV SOLN: INTRAVENOUS | Status: DC

## 2021-09-22 MED ORDER — FENTANYL CITRATE (PF) 100 MCG/2ML IJ SOLN
INTRAMUSCULAR | Status: DC | PRN
  Administered 2021-09-22 (×2): 50 ug via INTRAVENOUS

## 2021-09-22 MED ORDER — LIDOCAINE HCL (CARDIAC) PF 100 MG/5ML IV SOSY
PREFILLED_SYRINGE | INTRAVENOUS | Status: DC | PRN
  Administered 2021-09-22: 50 mg via INTRAVENOUS

## 2021-09-22 MED ORDER — FENTANYL CITRATE (PF) 100 MCG/2ML IJ SOLN
INTRAMUSCULAR | Status: AC
  Filled 2021-09-22: qty 2

## 2021-09-22 NOTE — Anesthesia Preprocedure Evaluation (Signed)
Anesthesia Evaluation  Patient identified by MRN, date of birth, ID band Patient awake    Reviewed: Allergy & Precautions, H&P , NPO status , Patient's Chart, lab work & pertinent test results  Airway Mallampati: I  TM Distance: <3 FB Neck ROM: full    Dental no notable dental hx.    Pulmonary neg pulmonary ROS, neg shortness of breath,    Pulmonary exam normal        Cardiovascular Exercise Tolerance: Good hypertension, (-) angina(-) Past MI and (-) DOE Normal cardiovascular exam Rhythm:Regular Rate:Normal     Neuro/Psych  Headaches, negative psych ROS   GI/Hepatic negative GI ROS, Neg liver ROS, neg GERD  ,  Endo/Other  negative endocrine ROS  Renal/GU negative Renal ROS  negative genitourinary   Musculoskeletal   Abdominal (+) + obese,   Peds  Hematology negative hematology ROS (+)   Anesthesia Other Findings Past Medical History: No date: Breast discharge     Comment:  LEFT BREAST -- Milky and Clear No date: Diverticulosis No date: Heart murmur No date: Hyperlipidemia No date: Hypertension No date: Migraines  Past Surgical History: No date: ABDOMINAL HYSTERECTOMY     Comment:  dub 2008/2009 h/o abnormal pap  No date: BACK SURGERY     Comment:  cervical spine  NS No date: CESAREAN SECTION     Comment:  x2   BMI    Body Mass Index:  33.79 kg/m      Reproductive/Obstetrics negative OB ROS                            Anesthesia Physical  Anesthesia Plan  ASA: III  Anesthesia Plan: General   Post-op Pain Management:    Induction: Intravenous  PONV Risk Score and Plan: Propofol infusion and TIVA  Airway Management Planned: Natural Airway and Nasal Cannula  Additional Equipment:   Intra-op Plan:   Post-operative Plan:   Informed Consent: I have reviewed the patients History and Physical, chart, labs and discussed the procedure including the risks,  benefits and alternatives for the proposed anesthesia with the patient or authorized representative who has indicated his/her understanding and acceptance.     Dental Advisory Given  Plan Discussed with: Anesthesiologist, CRNA and Surgeon  Anesthesia Plan Comments: (Patient consented for risks of anesthesia including but not limited to:  - adverse reactions to medications - risk of intubation if required - damage to teeth, lips or other oral mucosa - sore throat or hoarseness - Damage to heart, brain, lungs or loss of life  Patient voiced understanding.)       Anesthesia Quick Evaluation

## 2021-09-22 NOTE — H&P (Signed)
Jonathon Bellows, MD [address], [place], [place], [place], [address] [address], [place], [place], [place], [address] Phone: [phone]  Fax: [phone]  Primary Care Physician:  McLean-Scocuzza, Nino Glow, MD   Pre-Procedure History & Physical: HPI:  Joyce Simpson is a 60 y.o. female is here for an colonoscopy.   Past Medical History:  Diagnosis Date   COVID-19    12/19/19   Diverticulosis    Heart murmur    Hyperlipidemia    Hypertension    Migraines    Osteoporosis     Past Surgical History:  Procedure Laterality Date   ABDOMINAL HYSTERECTOMY     dub 2008/2009 h/o abnormal pap    BACK SURGERY     cervical spine [place] NS   CESAREAN SECTION     x2    COLONOSCOPY WITH PROPOFOL N/A 04/11/2018   Procedure: COLONOSCOPY WITH PROPOFOL;  Surgeon: Jonathon Bellows, MD;  Location: [facility] ENDOSCOPY;  Service: Gastroenterology;  Laterality: N/A;   COLONOSCOPY WITH PROPOFOL N/A 06/20/2018   Procedure: COLONOSCOPY WITH PROPOFOL;  Surgeon: Jonathon Bellows, MD;  Location: [facility] ENDOSCOPY;  Service: Gastroenterology;  Laterality: N/A;    Prior to Admission medications   Medication Sig Start Date End Date Taking? Authorizing Provider  amLODipine (NORVASC) 5 MG tablet Take 1 tablet (5 mg total) by mouth daily. 08/04/21  Yes McLean-Scocuzza, Nino Glow, MD  losartan-hydrochlorothiazide (HYZAAR) 50-12.5 MG tablet TAKE 1 TABLET BY MOUTH DAILY IN THE MORNING 08/04/21 08/04/22 Yes McLean-Scocuzza, Nino Glow, MD  pravastatin (PRAVACHOL) 20 MG tablet Take 1 tablet (20 mg total) by mouth daily after dinner 08/07/21  Yes McLean-Scocuzza, Nino Glow, MD  cetirizine (ZYRTEC) 10 MG tablet Take 1 tablet (10 mg total) by mouth daily as needed for allergies. Patient not taking: Reported on 08/04/2021 11/17/20   McLean-Scocuzza, Nino Glow, MD  Sod Picosulfate-Mag Ox-Cit Acd (CLENPIQ) 10-3.5-12 MG-GM -GM/160ML SOLN Take 1 bottle at 5 PM followed by five 8 oz cups of water and repeat 5 hours before procedure. 08/10/21   Jonathon Bellows, MD    Allergies as of 08/10/2021 - Review Complete 08/04/2021  Allergen Reaction Noted   Fosamax [alendronate sodium]  07/28/2018    Family History  Problem Relation Age of Onset   Hypertension Mother    AAA (abdominal aortic aneurysm) Mother    Aneurysm Mother    Asthma Sister    Stroke Sister    Cancer Brother        Colon   Hypertension Brother    Cancer Brother        colon cancer    AAA (abdominal aortic aneurysm) Brother    Anuerysm Brother    Diabetes Daughter        type 1    Cancer Maternal Aunt        Breast   Breast cancer Maternal Aunt    Cancer Paternal Aunt        Breast   Breast cancer Paternal Aunt    Breast cancer Paternal Aunt    Cancer Other        breast, lung and colon in 3 different cousings    Social History   Socioeconomic History   Marital status: Married    Spouse name: Not on file   Number of children: Not on file   Years of education: Not on file   Highest education level: Not on file  Occupational History   Not on file  Tobacco Use   Smoking status: Never  Smokeless tobacco: Never  Vaping Use   Vaping Use: Never used  Substance and Sexual Activity   Alcohol use: No   Drug use: No   Sexual activity: Not on file  Other Topics Concern   Not on file  Social History Narrative   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    Enjoys time with family    No guns, no exercise, feels safe in relationship, wears seatbelt    5 kids 4 boys, 1 girl      Social Determinants of Radio broadcast assistant Strain: Not on file  Food Insecurity: Not on file  Transportation Needs: Not on file  Physical Activity: Not on file  Stress: Not on file  Social Connections: Not on file  Intimate Partner Violence: Not on file    Review of Systems: See HPI, otherwise negative ROS  Physical Exam: BP (!) 167/89   Pulse 76   Temp (!) 96.7 F (35.9 C) (Temporal)   Resp 20   Ht 5' (1.524 m)   Wt 82.1 kg   SpO2 100%   BMI 35.35 kg/m  General:    Alert,  pleasant and cooperative in NAD Head:  Normocephalic and atraumatic. Neck:  Supple; no masses or thyromegaly. Lungs:  Clear throughout to auscultation, normal respiratory effort.    Heart:  +S1, +S2, Regular rate and rhythm, No edema. Abdomen:  Soft, nontender and nondistended. Normal bowel sounds, without guarding, and without rebound.   Neurologic:  Alert and  oriented x4;  grossly normal neurologically.  Impression/Plan: Joyce Simpson is here for an colonoscopy to be performed for surveillance due to prior history of colon polyps   Risks, benefits, limitations, and alternatives regarding  colonoscopy have been reviewed with the patient.  Questions have been answered.  All parties agreeable.   Jonathon Bellows, MD  09/22/2021, 8:49 AM

## 2021-09-22 NOTE — Op Note (Signed)
[facility] Gastroenterology Patient Name: Joyce Simpson Procedure Date: 09/22/2021 9:36 AM MRN: [id number] Account #: [id number] Date of Birth: 10-16-1961 Admit Type: Outpatient Age: 60 Room: [facility] ENDO ROOM 4 Gender: Female Note Status: Finalized Instrument Name: Jasper Riling [id number] Procedure:             Colonoscopy Indications:           Surveillance: Personal history of adenomatous polyps                         on last colonoscopy 3 years ago Providers:             Jonathon Bellows MD, MD Referring MD:          Nino Glow Mclean-Scocuzza MD, MD (Referring MD) Medicines:             Monitored Anesthesia Care Complications:         No immediate complications. Procedure:             Pre-Anesthesia Assessment:                        - Prior to the procedure, a History and Physical was                         performed, and patient medications, allergies and                         sensitivities were reviewed. The patient's tolerance                         of previous anesthesia was reviewed.                        - The risks and benefits of the procedure and the                         sedation options and risks were discussed with the                         patient. All questions were answered and informed                         consent was obtained.                        - ASA Grade Assessment: II - A patient with mild                         systemic disease.                        After obtaining informed consent, the colonoscope was                         passed under direct vision. Throughout the procedure,                         the patient's blood pressure, pulse, and oxygen  saturations were monitored continuously. The                         Colonoscope was introduced through the anus and                         advanced to the the cecum, identified by the                         appendiceal orifice. The colonoscopy was performed                          with ease. The patient tolerated the procedure well.                         The quality of the bowel preparation was excellent. Findings:      The perianal and digital rectal examinations were normal.      The entire examined colon appeared normal on direct and retroflexion       views. Impression:            - The entire examined colon is normal on direct and                         retroflexion views.                        - No specimens collected. Recommendation:        - Discharge patient to home (with escort).                        - Resume previous diet.                        - Continue present medications.                        - Repeat colonoscopy in 5 years for surveillance. Procedure Code(s):     --- Professional ---                        [phone], Colonoscopy, flexible; diagnostic, including                         collection of specimen(s) by brushing or washing, when                         performed (separate procedure) Diagnosis Code(s):     --- Professional ---                        Z86.010, Personal history of colonic polyps CPT copyright 2019 American Medical Association. All rights reserved. The codes documented in this report are preliminary and upon coder review may  be revised to meet current compliance requirements. Jonathon Bellows, MD Jonathon Bellows MD, MD 09/22/2021 9:55:50 AM This report has been signed electronically. Number of Addenda: 0 Note Initiated On: 09/22/2021 9:36 AM Scope Withdrawal Time: 0 hours 9 minutes 54 seconds  Total Procedure Duration: 0 hours 13 minutes 31 seconds  Estimated Blood Loss:  Estimated blood loss: none.      [facility]

## 2021-09-22 NOTE — Anesthesia Postprocedure Evaluation (Signed)
Anesthesia Post Note  Patient: Joyce Simpson  Procedure(s) Performed: COLONOSCOPY WITH PROPOFOL  Patient location during evaluation: Endoscopy Anesthesia Type: General Level of consciousness: awake and alert Pain management: pain level controlled Vital Signs Assessment: post-procedure vital signs reviewed and stable Respiratory status: spontaneous breathing, nonlabored ventilation and respiratory function stable Cardiovascular status: blood pressure returned to baseline and stable Postop Assessment: no apparent nausea or vomiting Anesthetic complications: no   No notable events documented.   Last Vitals:  Vitals:   09/22/21 0815 09/22/21 0957  BP: (!) 167/89 (!) 126/59  Pulse: 76   Resp: 20   Temp: (!) 35.9 C (!) 35.8 C  SpO2: 100%     Last Pain:  Vitals:   09/22/21 1027  TempSrc:   PainSc: 0-No pain                 [place] Ouch

## 2021-09-22 NOTE — Transfer of Care (Signed)
Immediate Anesthesia Transfer of Care Note  Patient: Joyce Simpson  Procedure(s) Performed: COLONOSCOPY WITH PROPOFOL  Patient Location: PACU  Anesthesia Type:General  Level of Consciousness: sedated  Airway & Oxygen Therapy: Patient Spontanous Breathing and Patient connected to nasal cannula oxygen  Post-op Assessment: Report given to RN and Post -op Vital signs reviewed and stable  Post vital signs: Reviewed and stable  Last Vitals:  Vitals Value Taken Time  BP 126/59 09/22/21 0958  Temp 35.8 C 09/22/21 0957  Pulse 74 09/22/21 0959  Resp 15 09/22/21 0959  SpO2 100 % 09/22/21 0959  Vitals shown include unvalidated device data.  Last Pain:  Vitals:   09/22/21 0957  TempSrc: Temporal  PainSc:          Complications: No notable events documented.

## 2021-09-26 NOTE — Telephone Encounter (Signed)
Patient is calling in to schedule her prolia injection,she would like to start the first one next month and is scheduled for 10/28.Patient would also like to know how many prolia injections she will have to receive.Please advise.

## 2021-09-26 NOTE — Telephone Encounter (Signed)
Patient prolia approved ready to schedule, cost to patient $ 60.00  every six months.May schedule.

## 2021-10-16 ENCOUNTER — Other Ambulatory Visit: Payer: Self-pay

## 2021-10-27 ENCOUNTER — Ambulatory Visit: Payer: 59

## 2021-11-17 ENCOUNTER — Other Ambulatory Visit: Payer: Self-pay

## 2021-11-17 ENCOUNTER — Ambulatory Visit (INDEPENDENT_AMBULATORY_CARE_PROVIDER_SITE_OTHER): Payer: 59

## 2021-11-17 DIAGNOSIS — M810 Age-related osteoporosis without current pathological fracture: Secondary | ICD-10-CM | POA: Diagnosis not present

## 2021-11-17 MED ORDER — DENOSUMAB 60 MG/ML ~~LOC~~ SOSY
60.0000 mg | PREFILLED_SYRINGE | Freq: Once | SUBCUTANEOUS | Status: AC
  Administered 2021-11-17: 60 mg via SUBCUTANEOUS

## 2021-11-17 NOTE — Progress Notes (Signed)
Patient presented for 6-month Prolia injection SQ to left arm. Patient tolerated well. 

## 2021-12-12 ENCOUNTER — Other Ambulatory Visit: Payer: Self-pay

## 2022-01-23 ENCOUNTER — Other Ambulatory Visit: Payer: Self-pay

## 2022-01-23 ENCOUNTER — Emergency Department
Admission: EM | Admit: 2022-01-23 | Discharge: 2022-01-23 | Disposition: A | Discharge status: Home / Self Care | Payer: 59 | Attending: Student in an Organized Health Care Education/Training Program | Admitting: Student in an Organized Health Care Education/Training Program

## 2022-01-23 ENCOUNTER — Telehealth: Payer: Self-pay | Admitting: Internal Medicine

## 2022-01-23 ENCOUNTER — Emergency Department: Payer: 59

## 2022-01-23 ENCOUNTER — Encounter: Payer: Self-pay | Admitting: Emergency Medicine

## 2022-01-23 DIAGNOSIS — R519 Headache, unspecified: Secondary | ICD-10-CM | POA: Insufficient documentation

## 2022-01-23 DIAGNOSIS — R0789 Other chest pain: Secondary | ICD-10-CM | POA: Diagnosis not present

## 2022-01-23 DIAGNOSIS — I10 Essential (primary) hypertension: Secondary | ICD-10-CM | POA: Insufficient documentation

## 2022-01-23 DIAGNOSIS — M25511 Pain in right shoulder: Secondary | ICD-10-CM | POA: Insufficient documentation

## 2022-01-23 DIAGNOSIS — Z20822 Contact with and (suspected) exposure to covid-19: Secondary | ICD-10-CM | POA: Insufficient documentation

## 2022-01-23 DIAGNOSIS — R079 Chest pain, unspecified: Secondary | ICD-10-CM | POA: Diagnosis not present

## 2022-01-23 DIAGNOSIS — R200 Anesthesia of skin: Secondary | ICD-10-CM | POA: Diagnosis not present

## 2022-01-23 LAB — D-DIMER, QUANTITATIVE: D-Dimer, Quant: 0.35 ug/mL-FEU (ref 0.00–0.50)

## 2022-01-23 LAB — BASIC METABOLIC PANEL
Anion gap: 7 (ref 5–15)
BUN: 11 mg/dL (ref 8–23)
CO2: 27 mmol/L (ref 22–32)
Calcium: 9 mg/dL (ref 8.9–10.3)
Chloride: 103 mmol/L (ref 98–111)
Creatinine, Ser: 0.74 mg/dL (ref 0.44–1.00)
GFR, Estimated: >60 mL/min (ref >60)
Glucose, Bld: 100 mg/dL — ABNORMAL HIGH (ref 70–99)
Potassium: 3.3 mmol/L — ABNORMAL LOW (ref 3.5–5.1)
Sodium: 137 mmol/L (ref 135–145)

## 2022-01-23 LAB — CBC
HCT: 43.1 % (ref 36.0–46.0)
Hemoglobin: 14.2 g/dL (ref 12.0–15.0)
MCH: 27.8 pg (ref 26.0–34.0)
MCHC: 32.9 g/dL (ref 30.0–36.0)
MCV: 84.3 fL (ref 80.0–100.0)
Platelets: 234 10*3/uL (ref 150–400)
RBC: 5.11 MIL/uL (ref 3.87–5.11)
RDW: 12.4 % (ref 11.5–15.5)
WBC: 4.9 10*3/uL (ref 4.0–10.5)
nRBC: 0 % (ref 0.0–0.2)

## 2022-01-23 LAB — TROPONIN I (HIGH SENSITIVITY)
Troponin I (High Sensitivity): 5 ng/L (ref <18)
Troponin I (High Sensitivity): 5 ng/L (ref <18)

## 2022-01-23 LAB — RESP PANEL BY RT-PCR (FLU A&B, COVID) ARPGX2
Influenza A by PCR: NEGATIVE
Influenza B by PCR: NEGATIVE
SARS Coronavirus 2 by RT PCR: NEGATIVE

## 2022-01-23 MED ORDER — AMLODIPINE BESYLATE 5 MG PO TABS
10.0000 mg | ORAL_TABLET | Freq: Once | ORAL | Status: AC
  Administered 2022-01-23: 14:00:00 10 mg via ORAL
  Filled 2022-01-23: qty 2

## 2022-01-23 MED ORDER — ACETAMINOPHEN 500 MG PO TABS
1000.0000 mg | ORAL_TABLET | Freq: Once | ORAL | Status: AC
  Administered 2022-01-23: 13:00:00 1000 mg via ORAL
  Filled 2022-01-23: qty 2

## 2022-01-23 NOTE — ED Triage Notes (Signed)
Pt to POV with c/o CP and headache. Chest pain is bilat upper chest that began Friday and goes down her right arm. Denies any N/V or SHOB. Her headache also started on Friday.

## 2022-01-23 NOTE — ED Provider Notes (Signed)
[facility] Provider Note    Event Date/Time   First MD Initiated Contact with Patient 01/23/22 1003     (approximate)   History   Chest Pain and Headache   HPI  Joyce Simpson is a 61 y.o. female presents to the ER for evaluation of right upper chest pain as well as headache for the past several days.  Symptoms started on Friday.  States that it comes and go he denies any diaphoresis. No pleuritic chest pain.  Does have a history of hypertension but no history of CAD or diabetes.  Denies any pain ripping or tearing through to her back.  No known sick contacts.  Not any cough nausea or vomiting.  Denies any shortness of breath.  There is some reproducibility of pain with movement of the right shoulder and arm.     Physical Exam   Triage Vital Signs: ED Triage Vitals  Enc Vitals Group     BP 01/23/22 0951 (!) 166/88     Pulse Rate 01/23/22 0951 66     Resp 01/23/22 0951 20     Temp 01/23/22 0951 97.8 F (36.6 C)     Temp Source 01/23/22 0951 Oral     SpO2 01/23/22 0951 97 %     Weight --      Height 01/23/22 0949 5' (1.524 m)     Head Circumference --      Peak Flow --      Pain Score 01/23/22 0949 6     Pain Loc --      Pain Edu? --      Excl. in [place]? --     Most recent vital signs: Vitals:   01/23/22 1417 01/23/22 1452  BP: (!) 150/95   Pulse:  73  Resp:  18  Temp:    SpO2:  100%     Constitutional: Alert  Eyes: Conjunctivae are normal.  Head: Atraumatic. Nose: No congestion/rhinnorhea. Mouth/Throat: Mucous membranes are moist.   Neck: Painless ROM.  Cardiovascular:   Good peripheral circulation. Respiratory: Normal respiratory effort.  No retractions.  Gastrointestinal: Soft and nontender.  Musculoskeletal:  no deformity Neurologic:  MAE spontaneously. No gross focal neurologic deficits are appreciated.  Skin:  Skin is warm, dry and intact. No rash noted. Psychiatric: Mood and affect are normal. Speech and behavior are  normal.    ED Results / Procedures / Treatments   Labs (all labs ordered are listed, but only abnormal results are displayed) Labs Reviewed  BASIC METABOLIC PANEL - Abnormal; Notable for the following components:      Result Value   Potassium 3.3 (*)    Glucose, Bld 100 (*)    All other components within normal limits  RESP PANEL BY RT-PCR (FLU A&B, COVID) ARPGX2  CBC  D-DIMER, QUANTITATIVE (NOT AT [facility])  TROPONIN I (HIGH SENSITIVITY)  TROPONIN I (HIGH SENSITIVITY)     EKG  ED ECG REPORT I, Merlyn Lot, the attending physician, personally viewed and interpreted this ECG.   Date: 01/23/2022  EKG Time: 9:50  Rate: 70  Rhythm: sinus  Axis: normal  Intervals:normal qt  ST&T Change: non specific st abn, no stemi    RADIOLOGY Please see ED Course for my review and interpretation.  I personally reviewed all radiographic images ordered to evaluate for the above acute complaints and reviewed radiology reports and findings.  These findings were personally discussed with the patient.  Please see medical record for radiology report.  PROCEDURES:  Critical Care performed: No  Procedures   MEDICATIONS ORDERED IN ED: Medications  acetaminophen (TYLENOL) tablet 1,000 mg (1,000 mg Oral Given 01/23/22 1251)  amLODipine (NORVASC) tablet 10 mg (10 mg Oral Given 01/23/22 1417)     IMPRESSION / MDM / ASSESSMENT AND PLAN / ED COURSE  I reviewed the triage vital signs and the nursing notes.                              Differential diagnosis includes, but is not limited to, ACS, pericarditis, esophagitis, pe, dissection, pna, bronchitis, costochondritis  Patient presenting with right shoulder discomfort as described above.  She is nontoxic-appearin, mild hypertension.  EKG is nonischemic.  Initial troponin is negative.  Chest x-ray ordered with above differential.  She is low risk by Wells criteria will order D-dimer to further stratify.  Her abdominal exam is soft  benign.  She is declining anything for pain but has agreed to take Tylenol.  She is not hypoxic.  Symptoms have been ongoing for several days therefore have a lower suspicion for ACS but given her age and risk factors will order serial enzymes and monitor.  Clinical Course as of 01/23/22 1535  Tue Jan 23, 2022  1018 Chest x-ray by my review does not show any evidence of pneumothorax. [PR]  1232 D-dimer is negative.  Very low suspicion for dissection does not seem consistent with PE.  No leukocytosis.  Initial troponin negative. [PR]  1454 Patient reassessed.  Repeat troponin negative.  She is pain-free at this time.  We discussed option for hospitalization for further observation and serial enzymes but given reassuring work-up after several days of discomfort do not feel this reflects ACS and patient feels comfortable going home at this time.  But do feel that she would benefit from cardiac work-up and will give referral for close clinic follow-up.  Patient agreeable plan.  We discussed return precautions. [PR]    Clinical Course User Index [PR] Merlyn Lot, MD     FINAL CLINICAL IMPRESSION(S) / ED DIAGNOSES   Final diagnoses:  Atypical chest pain     Rx / DC Orders   ED Discharge Orders     None        Note:  This document was prepared using Dragon voice recognition software and may include unintentional dictation errors.    Merlyn Lot, MD 01/23/22 1535

## 2022-01-23 NOTE — Telephone Encounter (Signed)
Patient called and made an appointment for tomorrow. Because of her symptoms of having a headache and chest pain off and on since Thursday patient wast sent to Access Nurse to be triaged.

## 2022-01-23 NOTE — ED Notes (Signed)
Follow up cardiologist info provided, all questions answered.

## 2022-01-24 ENCOUNTER — Ambulatory Visit (INDEPENDENT_AMBULATORY_CARE_PROVIDER_SITE_OTHER): Payer: Self-pay | Admitting: Adult Health

## 2022-01-24 DIAGNOSIS — R079 Chest pain, unspecified: Secondary | ICD-10-CM | POA: Diagnosis not present

## 2022-01-24 DIAGNOSIS — Z91199 Patient's noncompliance with other medical treatment and regimen due to unspecified reason: Secondary | ICD-10-CM

## 2022-01-24 DIAGNOSIS — I10 Essential (primary) hypertension: Secondary | ICD-10-CM | POA: Diagnosis not present

## 2022-01-24 DIAGNOSIS — R002 Palpitations: Secondary | ICD-10-CM | POA: Diagnosis not present

## 2022-01-24 NOTE — Progress Notes (Addendum)
No show for scheduled appointment scheduled for headaches on 01/24/22 at 8am.

## 2022-02-02 NOTE — Telephone Encounter (Signed)
Seen at ED and FU with cardiology.

## 2022-02-13 DIAGNOSIS — R079 Chest pain, unspecified: Secondary | ICD-10-CM | POA: Diagnosis not present

## 2022-02-19 ENCOUNTER — Other Ambulatory Visit: Payer: Self-pay

## 2022-02-19 DIAGNOSIS — R079 Chest pain, unspecified: Secondary | ICD-10-CM | POA: Diagnosis not present

## 2022-02-20 ENCOUNTER — Other Ambulatory Visit: Payer: Self-pay

## 2022-02-20 DIAGNOSIS — I491 Atrial premature depolarization: Secondary | ICD-10-CM | POA: Diagnosis not present

## 2022-02-20 DIAGNOSIS — Z23 Encounter for immunization: Secondary | ICD-10-CM | POA: Diagnosis not present

## 2022-02-20 MED ORDER — METOPROLOL SUCCINATE ER 25 MG PO TB24
25.0000 mg | ORAL_TABLET | Freq: Every day | ORAL | 11 refills | Status: DC
  Filled 2022-02-20: qty 30, 30d supply, fill #0

## 2022-02-23 ENCOUNTER — Telehealth: Payer: Self-pay | Admitting: Internal Medicine

## 2022-02-23 NOTE — Telephone Encounter (Signed)
Patient has started seeing cardiology and wants to come in an discuss with PCP . Patient is having no current issues. Wants to discuss Dr. Saralyn Pilar visit and Metoprolol script appt scheduled.

## 2022-02-23 NOTE — Telephone Encounter (Addendum)
Pt called in stating that she been having trouble with her heart. Pt stated that she would like to discuss something over the phone with you. Pt stated that she have a few questions that she would like to ask you. Pt is requesting callback.

## 2022-02-23 NOTE — Telephone Encounter (Signed)
Appt 02/28/22 Will discuss then

## 2022-02-28 ENCOUNTER — Other Ambulatory Visit: Payer: Self-pay

## 2022-02-28 ENCOUNTER — Encounter: Payer: Self-pay | Admitting: Internal Medicine

## 2022-02-28 ENCOUNTER — Ambulatory Visit: Payer: 59 | Admitting: Internal Medicine

## 2022-02-28 VITALS — BP 128/80 | HR 70 | Temp 97.8°F | Ht 60.0 in | Wt 192.6 lb

## 2022-02-28 DIAGNOSIS — E876 Hypokalemia: Secondary | ICD-10-CM

## 2022-02-28 DIAGNOSIS — I491 Atrial premature depolarization: Secondary | ICD-10-CM | POA: Diagnosis not present

## 2022-02-28 DIAGNOSIS — R7303 Prediabetes: Secondary | ICD-10-CM

## 2022-02-28 DIAGNOSIS — Z1231 Encounter for screening mammogram for malignant neoplasm of breast: Secondary | ICD-10-CM

## 2022-02-28 DIAGNOSIS — E785 Hyperlipidemia, unspecified: Secondary | ICD-10-CM | POA: Diagnosis not present

## 2022-02-28 LAB — LIPID PANEL
Cholesterol: 187 mg/dL (ref 0–200)
HDL: 62.1 mg/dL (ref >39.00)
LDL Cholesterol: 115 mg/dL — ABNORMAL HIGH (ref 0–99)
NonHDL: 125.12
Total CHOL/HDL Ratio: 3
Triglycerides: 51 mg/dL (ref 0.0–149.0)
VLDL: 10.2 mg/dL (ref 0.0–40.0)

## 2022-02-28 LAB — COMPREHENSIVE METABOLIC PANEL
ALT: 13 U/L (ref 0–35)
AST: 13 U/L (ref 0–37)
Albumin: 4.1 g/dL (ref 3.5–5.2)
Alkaline Phosphatase: 70 U/L (ref 39–117)
BUN: 9 mg/dL (ref 6–23)
CO2: 29 mEq/L (ref 19–32)
Calcium: 9.1 mg/dL (ref 8.4–10.5)
Chloride: 104 mEq/L (ref 96–112)
Creatinine, Ser: 0.81 mg/dL (ref 0.40–1.20)
GFR: 78.49 mL/min (ref >60.00)
Glucose, Bld: 91 mg/dL (ref 70–99)
Potassium: 3.9 mEq/L (ref 3.5–5.1)
Sodium: 139 mEq/L (ref 135–145)
Total Bilirubin: 0.6 mg/dL (ref 0.2–1.2)
Total Protein: 6.7 g/dL (ref 6.0–8.3)

## 2022-02-28 LAB — HEMOGLOBIN A1C: Hgb A1c MFr Bld: 5.7 % (ref 4.6–6.5)

## 2022-02-28 NOTE — Patient Instructions (Addendum)
If heart rate <60 blood pressure <90/<60 or feeling tired with these +  Cut metoprolol in 1/2 pill   Simply lemonade light    05/17/22 prolia shot due   Upcoming Encounters Date Type Specialty Care Team Description  05/22/2022 Office Visit Cardiology Isaias Cowman, MD   [facility]   [place], [place] [address]   [phone] (Work)   [phone] (Fax)     Zoster Vaccine, Recombinant injection What is this medication? ZOSTER VACCINE (ZOS ter vak SEEN) is a vaccine used to reduce the risk of getting shingles. This vaccine is not used to treat shingles or nerve pain from shingles. This medicine may be used for other purposes; ask your health care provider or pharmacist if you have questions. COMMON BRAND NAME(S): [facility] What should I tell my care team before I take this medication? They need to know if you have any of these conditions: cancer immune system problems an unusual or allergic reaction to Zoster vaccine, other medications, foods, dyes, or preservatives pregnant or trying to get pregnant breast-feeding How should I use this medication? This vaccine is injected into a muscle. It is given by a health care provider. A copy of Vaccine Information Statements will be given before each vaccination. Be sure to read this information carefully each time. This sheet may change often. Talk to your health care provider about the use of this vaccine in children. This vaccine is not approved for use in children. Overdosage: If you think you have taken too much of this medicine contact a poison control center or emergency room at once. NOTE: This medicine is only for you. Do not share this medicine with others. What if I miss a dose? Keep appointments for follow-up (booster) doses. It is important not to miss your dose. Call your health care provider if you are unable to keep an appointment. What may interact with this medication? medicines  that suppress your immune system medicines to treat cancer steroid medicines like prednisone or cortisone This list may not describe all possible interactions. Give your health care provider a list of all the medicines, herbs, non-prescription drugs, or dietary supplements you use. Also tell them if you smoke, drink alcohol, or use illegal drugs. Some items may interact with your medicine. What should I watch for while using this medication? Visit your health care provider regularly. This vaccine, like all vaccines, may not fully protect everyone. What side effects may I notice from receiving this medication? Side effects that you should report to your doctor or health care professional as soon as possible: allergic reactions (skin rash, itching or hives; swelling of the face, lips, or tongue) trouble breathing Side effects that usually do not require medical attention (report these to your doctor or health care professional if they continue or are bothersome): chills headache fever nausea pain, redness, or irritation at site where injected tiredness vomiting This list may not describe all possible side effects. Call your doctor for medical advice about side effects. You may report side effects to FDA at 1-800-FDA-1088. Where should I keep my medication? This vaccine is only given by a health care provider. It will not be stored at home. NOTE: This sheet is a summary. It may not cover all possible information. If you have questions about this medicine, talk to your doctor, pharmacist, or health care provider.  2022 Elsevier/Gold Standard (2021-09-05 00:00:00)  Premature Atrial Contraction A premature atrial contraction [facility]) is a kind of irregular heartbeat (  arrhythmia). It happens when the heart beats too early and then pauses before beating again. The heart has four areas, or chambers. Normally, electrical signals spread across the heart and make all the chambers beat together. During a  PAC, the upper chambers of the heart (atria) beat too early, before they have had time to fill with blood. The heartbeat pauses afterward so the heart can fill with blood for the next beat. Sometimes PAC can be a warning sign of another type of arrhythmia called atrial fibrillation. Atrial fibrillation may allow blood to pool in the atria and form clots. If a clot travels to the brain, it can cause a stroke. What are the causes? The cause of this condition is often unknown. Sometimes, this condition may be caused by heart disease or injury to the heart. What increases the risk? You are more likely to develop this condition if: You are a child. You are an adult who is 54 years of age or older. Episodes may be triggered by: Caffeine. Alcohol. Tobacco use. Stimulant drugs. Some medicines or supplements. Stress. Heart disease. What are the signs or symptoms? Symptoms of this condition include: A feeling that your heart skipped a beat. The first heartbeat after the "skipped" beat may feel more forceful. A feeling that your heart is fluttering. How is this diagnosed? This condition is diagnosed based on: Your symptoms. A physical exam. Your health care provider may listen to your heart. An electrocardiogram (ECG). This is a test that records the electrical impulses of the heart. An ambulatory cardiac monitor. This device records your heartbeats for 24 hours or more. You may also have: An echocardiogram to check for any heart conditions. This is a type of imaging test that uses sound waves (ultrasound) to make images of your heart. Blood tests. How is this treated? Treatment depends on the frequency of your symptoms and other risk factors. Treatments may include: Medicines (beta-blockers). Catheter ablation. This is done to destroy the part of the heart tissue that sends abnormal signals. In some cases, treatment may not be needed for this condition. Follow these instructions at  home: Lifestyle Do not use any products that contain nicotine or tobacco, such as cigarettes, e-cigarettes, and chewing tobacco. If you need help quitting, ask your health care provider. Exercise regularly. Ask your health care provider what type of exercise is safe for you. Find healthy ways to manage stress. Try to get at least 7-9 hours of sleep each night, or as much as recommended by your health care provider. Alcohol use Do not drink alcohol if: Your health care provider tells you not to drink. You are pregnant, may be pregnant, or are planning to become pregnant. Alcohol triggers your episodes. If you drink alcohol: Limit how much you use to: 0-1 drink a day for women. 0-2 drinks a day for men. Be aware of how much alcohol is in your drink. In the [place] one drink equals one 12 oz bottle of beer (355 mL), one 5 oz glass of wine (148 mL), or one 1 oz glass of hard liquor (44 mL). General instructions Take over-the-counter and prescription medicines only as told by your health care provider. If caffeine triggers episodes, do not eat, drink, or use anything with caffeine in it. Keep all follow-up visits as told by your health care provider. This is important. Contact a health care provider if: You feel your heart skipping beats. Your heart skips beats and you feel dizzy, light-headed, or very tired. Get help  right away if you have: Chest pain. Trouble breathing. Any symptoms of a stroke. "BE FAST" is an easy way to remember the main warning signs of a stroke. B - Balance. Signs are dizziness, sudden trouble walking, or loss of balance. E - Eyes. Signs are trouble seeing or a sudden change in vision. F - Face. Signs are sudden weakness or numbness of the face, or the face or eyelid drooping on one side. A - Arms. Signs are weakness or numbness in an arm. This happens suddenly and usually on one side of the body. S - Speech. Signs are sudden trouble speaking, slurred speech, or  trouble understanding what people say. T - Time. Time to call emergency services. Write down what time symptoms started. Other signs of stroke, such as: A sudden, severe headache with no known cause. Nausea or vomiting. Seizure. These symptoms may represent a serious problem that is an emergency. Do not wait to see if the symptoms will go away. Get medical help right away. Call your local emergency services (911 in the [place] Do not drive yourself to the hospital. Summary A premature atrial contraction [facility]) is a kind of irregular heartbeat (arrhythmia). It happens when the heart beats too early and then pauses before beating again. Treatment depends on your symptoms and whether you have other underlying heart conditions. Contact a health care provider if your heart skips beats and you feel dizzy, light-headed, or very tired. In some cases, this condition may lead to a stroke. "BE FAST" is an easy way to remember the warning signs of stroke. Get help right away if you have any of the "BE FAST" signs. This information is not intended to replace advice given to you by your health care provider. Make sure you discuss any questions you have with your health care provider. Document Revised: 05/16/2021 Document Reviewed: 05/16/2021 Elsevier Patient Education  [place].

## 2022-02-28 NOTE — Progress Notes (Signed)
Chief Complaint  Patient presents with   Medication Management   F/u  1. PAC started toprol 25 xl per cards pt have had it yet and zio with hr 57-137 f/u in 04/2022  Pt not started due to wants to discuss with PCP c/w side effects 2. Hypok labs 12/2021   Review of Systems  Constitutional:  Negative for weight loss.  HENT:  Negative for hearing loss.   Eyes:  Negative for blurred vision.  Respiratory:  Negative for shortness of breath.   Cardiovascular:  Negative for chest pain.  Gastrointestinal:  Negative for abdominal pain and blood in stool.  Genitourinary:  Negative for dysuria.  Musculoskeletal:  Negative for falls and joint pain.  Skin:  Negative for rash.  Neurological:  Negative for headaches.  Psychiatric/Behavioral:  Negative for depression.   Past Medical History:  Diagnosis Date   COVID-19    12/19/19   Diverticulosis    Heart murmur    Hyperlipidemia    Hypertension    Migraines    Osteoporosis    Past Surgical History:  Procedure Laterality Date   ABDOMINAL HYSTERECTOMY     dub 2008/2009 h/o abnormal pap    BACK SURGERY     cervical spine [place] NS   CESAREAN SECTION     x2    COLONOSCOPY WITH PROPOFOL N/A 04/11/2018   Procedure: COLONOSCOPY WITH PROPOFOL;  Surgeon: Jonathon Bellows, MD;  Location: [facility] ENDOSCOPY;  Service: Gastroenterology;  Laterality: N/A;   COLONOSCOPY WITH PROPOFOL N/A 06/20/2018   Procedure: COLONOSCOPY WITH PROPOFOL;  Surgeon: Jonathon Bellows, MD;  Location: [facility] ENDOSCOPY;  Service: Gastroenterology;  Laterality: N/A;   COLONOSCOPY WITH PROPOFOL N/A 09/22/2021   Procedure: COLONOSCOPY WITH PROPOFOL;  Surgeon: Jonathon Bellows, MD;  Location: [facility] ENDOSCOPY;  Service: Gastroenterology;  Laterality: N/A;   Family History  Problem Relation Age of Onset   Hypertension Mother    AAA (abdominal aortic aneurysm) Mother    Aneurysm Mother    Asthma Sister    Stroke Sister    Cancer Brother        Colon   Hypertension Brother    Cancer Brother         colon cancer    AAA (abdominal aortic aneurysm) Brother    Anuerysm Brother    Diabetes Daughter        type 1    Cancer Maternal Aunt        Breast   Breast cancer Maternal Aunt    Cancer Paternal Aunt        Breast   Breast cancer Paternal Aunt    Breast cancer Paternal Aunt    Cancer Other        breast, lung and colon in 3 different cousings   Social History   Socioeconomic History   Marital status: Married    Spouse name: Not on file   Number of children: Not on file   Years of education: Not on file   Highest education level: Not on file  Occupational History   Not on file  Tobacco Use   Smoking status: Never   Smokeless tobacco: Never  Vaping Use   Vaping Use: Never used  Substance and Sexual Activity   Alcohol use: No   Drug use: No   Sexual activity: Not on file  Other Topics Concern   Not on file  Social History Narrative   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   cetirizine (ZYRTEC) 10 MG tablet Take 1 tablet (10 mg total) by mouth daily as needed for allergies.   losartan-hydrochlorothiazide (HYZAAR) 50-12.5 MG tablet TAKE 1 TABLET BY MOUTH DAILY IN THE MORNING   Allergies  Allergen Reactions   Fosamax [Alendronate Sodium]     GI upset    Recent Results (from the past 2160 hour(s))  Basic metabolic panel     Status: Abnormal   Collection Time: 01/23/22  9:45 AM  Result Value Ref Range   Sodium 137 135 - 145 mmol/L   Potassium 3.3 (L) 3.5 - 5.1 mmol/L    Chloride 103 98 - 111 mmol/L   CO2 27 22 - 32 mmol/L   Glucose, Bld 100 (H) 70 - 99 mg/dL    Comment: Glucose reference range applies only to samples taken after fasting for at least 8 hours.   BUN 11 8 - 23 mg/dL   Creatinine, Ser 0.74 0.44 - 1.00 mg/dL   Calcium 9.0 8.9 - 10.3 mg/dL   GFR, Estimated >60 >60 mL/min    Comment: (NOTE) Calculated using the CKD-EPI Creatinine Equation (2021)    Anion gap 7 5 - 15    Comment: Performed at [facility], [address] [place], [place] [address]  CBC     Status: None   Collection Time: 01/23/22  9:45 AM  Result Value Ref Range   WBC 4.9 4.0 - 10.5 K/uL   RBC 5.11 3.87 - 5.11 MIL/uL   Hemoglobin 14.2 12.0 - 15.0 g/dL   HCT 43.1 36.0 - 46.0 %   MCV 84.3 80.0 - 100.0 fL   MCH 27.8 26.0 - 34.0 pg   MCHC 32.9 30.0 - 36.0 g/dL   RDW 12.4 11.5 - 15.5 %   Platelets 234 150 - 400 K/uL   nRBC 0.0 0.0 - 0.2 %    Comment: Performed at [facility], [place], [place] [address]  Troponin I (High Sensitivity)     Status: None   Collection Time: 01/23/22  9:45 AM  Result Value Ref Range   Troponin I (High Sensitivity) 5 <18 ng/L    Comment: (NOTE) Elevated high sensitivity troponin I (hsTnI) values and significant  changes across serial measurements may suggest ACS but many other  chronic and acute conditions are known to elevate hsTnI results.  Refer to the "Links" section for chest pain algorithms and additional  guidance. Performed at [facility], [address] [place], [place] [address]   D-dimer, quantitative     Status: None   Collection Time: 01/23/22  9:45 AM  Result Value Ref Range   D-Dimer, Quant 0.35 0.00 - 0.50 ug/mL-FEU    Comment: (NOTE) At the manufacturer cut-off value of 0.5 g/mL FEU, this assay has a negative predictive value of 95-100%.This assay is intended for use in conjunction with a clinical pretest probability (PTP) assessment model to exclude pulmonary embolism  (  PE) and deep venous thrombosis (DVT) in outpatients suspected of PE or DVT. Results should be correlated with clinical presentation. Performed at [facility], [address] [place], [place] [address]   Resp Panel by RT-PCR (Flu A&B, Covid) Nasopharyngeal Swab     Status: None   Collection Time: 01/23/22 10:22 AM   Specimen: Nasopharyngeal Swab; Nasopharyngeal(NP) swabs in vial transport medium  Result Value Ref Range   SARS Coronavirus 2 by RT PCR NEGATIVE NEGATIVE    Comment: (NOTE) SARS-CoV-2 target nucleic acids are NOT DETECTED.  The SARS-CoV-2 RNA is generally detectable in upper respiratory specimens during the acute phase of infection. The lowest concentration of SARS-CoV-2 viral copies this assay can detect is 138 copies/mL. A negative result does not preclude SARS-Cov-2 infection and should not be used as the sole basis for treatment or other patient management decisions. A negative result may occur with  improper specimen collection/handling, submission of specimen other than nasopharyngeal swab, presence of viral mutation(s) within the areas targeted by this assay, and inadequate number of viral copies(<138 copies/mL). A negative result must be combined with clinical observations, patient history, and epidemiological information. The expected result is Negative.  Fact Sheet for Patients:  EntrepreneurPulse.com.au  Fact Sheet for Healthcare Providers:  IncredibleEmployment.be  This test is no t yet approved or cleared by the [place] FDA and  has been authorized for detection and/or diagnosis of SARS-CoV-2 by FDA under an Emergency Use Authorization (EUA). This EUA will remain  in effect (meaning this test can be used) for the duration of the COVID-19 declaration under Section 564(b)(1) of the Act, 21 U.S.C.section 360bbb-3(b)(1), unless the authorization is terminated  or revoked sooner.       Influenza A by  PCR NEGATIVE NEGATIVE   Influenza B by PCR NEGATIVE NEGATIVE    Comment: (NOTE) The Xpert Xpress SARS-CoV-2/FLU/RSV plus assay is intended as an aid in the diagnosis of influenza from Nasopharyngeal swab specimens and should not be used as a sole basis for treatment. Nasal washings and aspirates are unacceptable for Xpert Xpress SARS-CoV-2/FLU/RSV testing.  Fact Sheet for Patients: EntrepreneurPulse.com.au  Fact Sheet for Healthcare Providers: IncredibleEmployment.be  This test is not yet approved or cleared by the [place] FDA and has been authorized for detection and/or diagnosis of SARS-CoV-2 by FDA under an Emergency Use Authorization (EUA). This EUA will remain in effect (meaning this test can be used) for the duration of the COVID-19 declaration under Section 564(b)(1) of the Act, 21 U.S.C. section 360bbb-3(b)(1), unless the authorization is terminated or revoked.  Performed at [facility], [place], [place] [address]   Troponin I (High Sensitivity)     Status: None   Collection Time: 01/23/22  1:00 PM  Result Value Ref Range   Troponin I (High Sensitivity) 5 <18 ng/L    Comment: (NOTE) Elevated high sensitivity troponin I (hsTnI) values and significant  changes across serial measurements may suggest ACS but many other  chronic and acute conditions are known to elevate hsTnI results.  Refer to the "Links" section for chest pain algorithms and additional  guidance. Performed at [facility], [address] [place], [place] [address]    Objective  Body mass index is 37.61 kg/m. Wt Readings from Last 3 Encounters:  02/28/22 192 lb 9.6 oz (87.4 kg)  09/22/21 181 lb (82.1 kg)  08/04/21 188 lb 3.2 oz (85.4 kg)   Temp Readings from Last 3 Encounters:  02/28/22 97.8 F (36.6  C) (Oral)  01/23/22 98.9 F (37.2 C) (Oral)  09/22/21 (!) 96.5 F (35.8 C) (Temporal)   BP Readings from Last 3  Encounters:  02/28/22 128/80  01/23/22 (!) 150/95  09/22/21 (!) 126/59   Pulse Readings from Last 3 Encounters:  02/28/22 70  01/23/22 73  09/22/21 76    Physical Exam Vitals and nursing note reviewed.  Constitutional:      Appearance: Normal appearance. She is well-developed and well-groomed.  HENT:     Head: Normocephalic and atraumatic.  Eyes:     Conjunctiva/sclera: Conjunctivae normal.     Pupils: Pupils are equal, round, and reactive to light.  Cardiovascular:     Rate and Rhythm: Normal rate and regular rhythm.     Heart sounds: Normal heart sounds. No murmur heard. Pulmonary:     Effort: Pulmonary effort is normal.     Breath sounds: Normal breath sounds.  Abdominal:     General: Abdomen is flat. Bowel sounds are normal.     Tenderness: There is no abdominal tenderness.  Musculoskeletal:        General: No tenderness.  Skin:    General: Skin is warm and dry.  Neurological:     General: No focal deficit present.     Mental Status: She is alert and oriented to person, place, and time. Mental status is at baseline.     Cranial Nerves: Cranial nerves 2-12 are intact.     Gait: Gait is intact.  Psychiatric:        Attention and Perception: Attention and perception normal.        Mood and Affect: Mood and affect normal.        Speech: Speech normal.        Behavior: Behavior normal. Behavior is cooperative.        Thought Content: Thought content normal.        Cognition and Memory: Cognition and memory normal.        Judgment: Judgment normal.    Assessment  Plan  Premature atrial contraction Cut toprol 25 xl in 1/2 to start f/u cards 04/2022 monitor vitals  Consider sleep study in the future  Hypokalemia - Plan: Comprehensive metabolic panel, Lipid panel  Hyperlipidemia, unspecified hyperlipidemia type - Plan: Comprehensive metabolic panel, Lipid panel  Prediabetes - Plan: Hemoglobin A1c  HM Declines flu shot  Tdap utd Pfizer 4/4 will consider booster   Consider shingrix vaccine Hep c neg  Hep B vaccines 3/3 immune  MMR immune  covid vaccines 3/3 mammo neg 02/10/20 neg ordered 03/2021 neg ordered    DEXA 06/17/17 +osteoporosis due again 03/04/2019  -d/c fosamax today try to get prolia approved again disc at f/u could not tolerate fosamax Prolia shot had 10/2021 due 05/17/22  Consider dexa in future   05/01/17 pap neg neg HPV s/p hysterectomy DUB and h/o abnormal pap sees Dr. [facility]  -pap 07/2020 neg due 08/2023    [facility] EGD and colonoscopy had <50 and >5 years need to get records.  -With [place] 2 brothers colon cancer refer to Dr. Allen Norris pt has appt colonoscopy 04/11/18 Dr. Audria Nine MDs  -colonoscopy had 04/11/18 polyp and repeat 06/20/18 polpy +tubular adenoma given size GI [place] [facility] rec repeat in 3 years   -utd 09/22/21    rec D3 2000 IU qd  rec healthy diet and exercise  Est patty vision had eye exam 2022    Provider: Dr. Olivia Mackie McLean-Scocuzza-Internal Medicine

## 2022-04-03 ENCOUNTER — Other Ambulatory Visit: Payer: Self-pay

## 2022-04-30 ENCOUNTER — Telehealth: Payer: Self-pay | Admitting: *Deleted

## 2022-04-30 NOTE — Telephone Encounter (Signed)
Approved and scheduled.

## 2022-05-17 ENCOUNTER — Ambulatory Visit (INDEPENDENT_AMBULATORY_CARE_PROVIDER_SITE_OTHER): Payer: 59

## 2022-05-17 DIAGNOSIS — M810 Age-related osteoporosis without current pathological fracture: Secondary | ICD-10-CM

## 2022-05-17 MED ORDER — DENOSUMAB 60 MG/ML ~~LOC~~ SOSY
60.0000 mg | PREFILLED_SYRINGE | Freq: Once | SUBCUTANEOUS | Status: AC
  Administered 2022-05-17: 60 mg via SUBCUTANEOUS

## 2022-05-17 NOTE — Progress Notes (Signed)
Patient came in today for Prolia injection given in right arm SubQ. Patient tolerated well with no signs of distress.

## 2022-05-30 ENCOUNTER — Other Ambulatory Visit: Payer: Self-pay

## 2022-07-11 ENCOUNTER — Other Ambulatory Visit: Payer: Self-pay

## 2022-07-24 DIAGNOSIS — I10 Essential (primary) hypertension: Secondary | ICD-10-CM | POA: Diagnosis not present

## 2022-07-24 DIAGNOSIS — I491 Atrial premature depolarization: Secondary | ICD-10-CM | POA: Diagnosis not present

## 2022-07-24 DIAGNOSIS — R079 Chest pain, unspecified: Secondary | ICD-10-CM | POA: Diagnosis not present

## 2022-08-31 ENCOUNTER — Ambulatory Visit: Payer: 59 | Admitting: Internal Medicine

## 2022-08-31 ENCOUNTER — Encounter: Payer: Self-pay | Admitting: Internal Medicine

## 2022-08-31 ENCOUNTER — Other Ambulatory Visit: Payer: Self-pay

## 2022-08-31 ENCOUNTER — Ambulatory Visit: Payer: 59

## 2022-08-31 VITALS — BP 130/80 | HR 60 | Temp 98.1°F | Ht 60.0 in | Wt 192.6 lb

## 2022-08-31 DIAGNOSIS — R0981 Nasal congestion: Secondary | ICD-10-CM | POA: Diagnosis not present

## 2022-08-31 DIAGNOSIS — E785 Hyperlipidemia, unspecified: Secondary | ICD-10-CM

## 2022-08-31 DIAGNOSIS — Z0000 Encounter for general adult medical examination without abnormal findings: Secondary | ICD-10-CM | POA: Diagnosis not present

## 2022-08-31 DIAGNOSIS — J309 Allergic rhinitis, unspecified: Secondary | ICD-10-CM

## 2022-08-31 DIAGNOSIS — M810 Age-related osteoporosis without current pathological fracture: Secondary | ICD-10-CM

## 2022-08-31 DIAGNOSIS — I10 Essential (primary) hypertension: Secondary | ICD-10-CM | POA: Diagnosis not present

## 2022-08-31 MED ORDER — SALINE SPRAY 0.65 % NA SOLN
2.0000 | Freq: Every day | NASAL | 11 refills | Status: DC
  Filled 2022-08-31: qty 30, 300d supply, fill #0

## 2022-08-31 MED ORDER — PRAVASTATIN SODIUM 20 MG PO TABS
20.0000 mg | ORAL_TABLET | Freq: Every day | ORAL | 3 refills | Status: DC
  Filled 2022-08-31: qty 90, 90d supply, fill #0

## 2022-08-31 MED ORDER — FLUTICASONE PROPIONATE 50 MCG/ACT NA SUSP
2.0000 | Freq: Every day | NASAL | 5 refills | Status: DC | PRN
  Filled 2022-08-31: qty 16, 30d supply, fill #0

## 2022-08-31 MED ORDER — AMLODIPINE BESYLATE 5 MG PO TABS
5.0000 mg | ORAL_TABLET | Freq: Every day | ORAL | 3 refills | Status: DC
  Filled 2022-08-31 – 2022-10-29 (×2): qty 90, 90d supply, fill #0
  Filled 2023-03-12: qty 90, 90d supply, fill #1
  Filled 2023-06-25: qty 90, 90d supply, fill #2

## 2022-08-31 MED ORDER — LOSARTAN POTASSIUM-HCTZ 50-12.5 MG PO TABS
1.0000 | ORAL_TABLET | Freq: Every morning | ORAL | 3 refills | Status: DC
  Filled 2022-08-31: qty 90, 90d supply, fill #0
  Filled 2023-01-07: qty 90, 90d supply, fill #1
  Filled 2023-04-19: qty 90, 90d supply, fill #2
  Filled 2023-08-02: qty 90, 90d supply, fill #3

## 2022-08-31 MED ORDER — CETIRIZINE HCL 10 MG PO TABS
10.0000 mg | ORAL_TABLET | Freq: Every day | ORAL | 3 refills | Status: DC | PRN
  Filled 2022-08-31: qty 90, 90d supply, fill #0

## 2022-08-31 NOTE — Progress Notes (Addendum)
Chief Complaint  Patient presents with   Follow-up    6 month f/u   Annual Exam   Annual  1. Htn controlled on norvasc 5 mg qd and hyzaar 50-12.5 mg qd 2. Hld not taking and never picked up pravachol 20 mg qhs cholesterol ldl >100 will pick this up for goal <100     Review of Systems  Constitutional:  Negative for weight loss.  HENT:  Negative for hearing loss.   Eyes:  Negative for blurred vision.  Respiratory:  Negative for shortness of breath.   Cardiovascular:  Negative for chest pain.  Gastrointestinal:  Negative for abdominal pain and blood in stool.  Genitourinary:  Negative for dysuria.  Musculoskeletal:  Negative for falls and joint pain.  Skin:  Negative for rash.  Neurological:  Negative for headaches.  Psychiatric/Behavioral:  Negative for depression.    Past Medical History:  Diagnosis Date   COVID-19    12/19/19   Diverticulosis    Heart murmur    Hyperlipidemia    Hypertension    Migraines    Osteoporosis    Past Surgical History:  Procedure Laterality Date   ABDOMINAL HYSTERECTOMY     dub 2008/2009 h/o abnormal pap    BACK SURGERY     cervical spine [place] NS   CESAREAN SECTION     x2    COLONOSCOPY WITH PROPOFOL N/A 04/11/2018   Procedure: COLONOSCOPY WITH PROPOFOL;  Surgeon: Jonathon Bellows, MD;  Location: [facility] ENDOSCOPY;  Service: Gastroenterology;  Laterality: N/A;   COLONOSCOPY WITH PROPOFOL N/A 06/20/2018   Procedure: COLONOSCOPY WITH PROPOFOL;  Surgeon: Jonathon Bellows, MD;  Location: [facility] ENDOSCOPY;  Service: Gastroenterology;  Laterality: N/A;   COLONOSCOPY WITH PROPOFOL N/A 09/22/2021   Procedure: COLONOSCOPY WITH PROPOFOL;  Surgeon: Jonathon Bellows, MD;  Location: [facility] ENDOSCOPY;  Service: Gastroenterology;  Laterality: N/A;   Family History  Problem Relation Age of Onset   Hypertension Mother    AAA (abdominal aortic aneurysm) Mother    Aneurysm Mother    Asthma Sister    Stroke Sister    Cancer Brother        Colon   Hypertension Brother     Cancer Brother        colon cancer    AAA (abdominal aortic aneurysm) Brother    Anuerysm Brother    Diabetes Daughter        type 1    Cancer Maternal Aunt        Breast   Breast cancer Maternal Aunt    Cancer Paternal Aunt        Breast   Breast cancer Paternal Aunt    Breast cancer Paternal Aunt    Cancer Other        breast, lung and colon in 3 different cousings   Social History   Socioeconomic History   Marital status: Married    Spouse name: Not on file   Number of children: Not on file   Years of education: Not on file   Highest education level: Not on file  Occupational History   Not on file  Tobacco Use   Smoking status: Never   Smokeless tobacco: Never  Vaping Use   Vaping Use: Never used  Substance and Sexual Activity   Alcohol use: No   Drug use: No   Sexual activity: Not on file  Other Topics Concern   Not on file  Social History Narrative   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   sodium chloride (OCEAN) 0.65 % SOLN nasal spray Place 2 sprays into both nostrils daily.   [DISCONTINUED] amLODipine (NORVASC) 5 MG tablet Take 1 tablet (5 mg total) by mouth daily.   [DISCONTINUED] cetirizine (ZYRTEC) 10 MG tablet Take 1 tablet (10 mg total) by mouth daily as needed for allergies.   Allergies  Allergen Reactions   Fosamax [Alendronate Sodium]     GI upset    No results found for this or any previous visit (from the past  2160 hour(s)). Objective  Body mass index is 37.61 kg/m. Wt Readings from Last 3 Encounters:  08/31/22 192 lb 9.6 oz (87.4 kg)  02/28/22 192 lb 9.6 oz (87.4 kg)  09/22/21 181 lb (82.1 kg)   Temp Readings from Last 3 Encounters:  08/31/22 98.1 F (36.7 C) (Oral)  02/28/22 97.8 F (36.6 C) (Oral)  01/23/22 98.9 F (37.2 C) (Oral)   BP Readings from Last 3 Encounters:  08/31/22 130/80  02/28/22 128/80  01/23/22 (!) 150/95   Pulse Readings from Last 3 Encounters:  08/31/22 60  02/28/22 70  01/23/22 73    Physical Exam Vitals and nursing note reviewed.  Constitutional:      Appearance: Normal appearance. She is well-developed and well-groomed.  HENT:     Head: Normocephalic and atraumatic.  Eyes:     Conjunctiva/sclera: Conjunctivae normal.     Pupils: Pupils are equal, round, and reactive to light.  Cardiovascular:     Rate and Rhythm: Normal rate and regular rhythm.     Heart sounds: Normal heart sounds. No murmur heard. Pulmonary:     Effort: Pulmonary effort is normal.     Breath sounds: Normal breath sounds.  Abdominal:     General: Abdomen is flat. Bowel sounds are normal.     Tenderness: There is no abdominal tenderness.  Musculoskeletal:        General: No tenderness.  Skin:    General: Skin is warm and dry.  Neurological:     General: No focal deficit present.     Mental Status: She is alert and oriented to person, place, and time. Mental status is at baseline.     Cranial Nerves: Cranial nerves 2-12 are intact.     Motor: Motor function is intact.     Coordination: Coordination is intact.     Gait: Gait is intact.  Psychiatric:        Attention and Perception: Attention and perception normal.        Mood and Affect: Mood and affect normal.        Speech: Speech normal.        Behavior: Behavior normal. Behavior is cooperative.        Thought Content: Thought content normal.        Cognition and Memory: Cognition and memory normal.  Judgment:  Judgment normal.     Assessment  Plan  Annual physical exam See below  Essential hypertension - Plan: amLODipine (NORVASC) 5 MG tablet Hyzaar 50-12.5 mg qd   Hyperlipidemia, unspecified hyperlipidemia type - Plan: pravastatin (PRAVACHOL) 20 MG tablet  Nasal congestion - Plan: cetirizine (ZYRTEC) 10 MG tablet, fluticasone (FLONASE) 50 MCG/ACT nasal spray, sodium chloride (OCEAN) 0.65 % SOLN nasal spray  Allergic rhinitis, unspecified seasonality, unspecified trigger - Plan: cetirizine (ZYRTEC) 10 MG tablet, fluticasone (FLONASE) 50 MCG/ACT nasal spray, sodium chloride (OCEAN) 0.65 % SOLN nasal spray   HM Declines flu shot  Tdap utd Pfizer 4/4 will consider booster  Consider prevnar 20 Consider shingrix vaccine Hep c neg  Hep B vaccines 3/3 immune  MMR immune   mammo neg 02/10/20 neg ordered 03/2021 neg ordered  Call and schedule this   DEXA 06/17/17 +osteoporosis due again 03/04/2019  -d/c fosamax today try to get prolia approved again disc at f/u could not tolerate fosamax Prolia shot had 10/2021 due 05/17/22 Q6 months Consider dexa in future after prolia   05/01/17 pap neg neg HPV s/p hysterectomy DUB and h/o abnormal pap sees Dr. [facility]itt [facility]  -pap 07/2020 neg due 07/2025   [facility] EGD and colonoscopy had <50 and >5 years need to get records.  -With [place] 2 brothers colon cancer refer to Dr. Allen Norris pt has appt colonoscopy 04/11/18 Dr. Audria Nine MDs  -colonoscopy had 04/11/18 polyp and repeat 06/20/18 polpy +tubular adenoma given size GI [place] [facility] rec repeat in 3 years   -utd 09/22/21 due in 5 years 08/2026    rec D3 2000 IU qd  rec healthy diet and exercise  Est patty vision had eye exam 2022    Provider: Dr. Olivia Mackie McLean-Scocuzza-Internal Medicine

## 2022-08-31 NOTE — Patient Instructions (Addendum)
Dr. Volanda Napoleon ask for Dr. Nicki Reaper  Consider flu shot, covid shot, prevnar 20 vaccines   Pap smear due 07/2025 this will be your last 1   Call for mammogram and bone density   Colonoscopy due 09/22/2026  Retirement benefits specialist  [place] in [place] [phone]   Pneumococcal Conjugate Vaccine (Prevnar 20) Suspension for Injection What is this medication? PNEUMOCOCCAL VACCINE (NEU mo KOK al vak SEEN) is a vaccine. It prevents pneumococcus bacterial infections. These bacteria can cause serious infections like pneumonia, meningitis, and blood infections. This vaccine will not treat an infection and will not cause infection. This vaccine is recommended for adults 18 years and older. This medicine may be used for other purposes; ask your health care provider or pharmacist if you have questions. COMMON BRAND NAME(S): Prevnar 20 What should I tell my care team before I take this medication? They need to know if you have any of these conditions: bleeding disorder fever immune system problems an unusual or allergic reaction to pneumococcal vaccine, diphtheria toxoid, other vaccines, other medicines, foods, dyes, or preservatives pregnant or trying to get pregnant breast-feeding How should I use this medication? This vaccine is injected into a muscle. It is given by a health care provider. A copy of Vaccine Information Statements will be given before each vaccination. Be sure to read this information carefully each time. This sheet may change often. Talk to your health care provider about the use of this medicine in children. Special care may be needed. Overdosage: If you think you have taken too much of this medicine contact a poison control center or emergency room at once. NOTE: This medicine is only for you. Do not share this medicine with others. What if I miss a dose? This does not apply. This medicine is not for regular use. What may interact with this medication? medicines for cancer  chemotherapy medicines that suppress your immune function steroid medicines like prednisone or cortisone This list may not describe all possible interactions. Give your health care provider a list of all the medicines, herbs, non-prescription drugs, or dietary supplements you use. Also tell them if you smoke, drink alcohol, or use illegal drugs. Some items may interact with your medicine. What should I watch for while using this medication? Mild fever and pain should go away in 3 days or less. Report any unusual symptoms to your health care provider. What side effects may I notice from receiving this medication? Side effects that you should report to your doctor or health care professional as soon as possible: allergic reactions (skin rash, itching or hives; swelling of the face, lips, or tongue) confusion fast, irregular heartbeat fever over 102 degrees F muscle weakness seizures trouble breathing unusual bruising or bleeding Side effects that usually do not require medical attention (report to your doctor or health care professional if they continue or are bothersome): fever of 102 degrees F or less headache joint pain muscle cramps, pain pain, tender at site where injected This list may not describe all possible side effects. Call your doctor for medical advice about side effects. You may report side effects to FDA at 1-800-FDA-1088. Where should I keep my medication? This vaccine is only given by a health care provider. It will not be stored at home. NOTE: This sheet is a summary. It may not cover all possible information. If you have questions about this medicine, talk to your doctor, pharmacist, or health care provider.  2023 Elsevier/Gold Standard (2020-08-19 00:00:00)

## 2022-09-10 ENCOUNTER — Other Ambulatory Visit: Payer: Self-pay | Admitting: Internal Medicine

## 2022-09-10 DIAGNOSIS — Z1231 Encounter for screening mammogram for malignant neoplasm of breast: Secondary | ICD-10-CM

## 2022-10-22 ENCOUNTER — Ambulatory Visit
Admission: RE | Admit: 2022-10-22 | Discharge: 2022-10-22 | Disposition: A | Discharge status: Home / Self Care | Payer: 59 | Source: Ambulatory Visit | Attending: Internal Medicine | Admitting: Internal Medicine

## 2022-10-22 DIAGNOSIS — Z1231 Encounter for screening mammogram for malignant neoplasm of breast: Secondary | ICD-10-CM | POA: Insufficient documentation

## 2022-10-22 DIAGNOSIS — M8589 Other specified disorders of bone density and structure, multiple sites: Secondary | ICD-10-CM | POA: Diagnosis not present

## 2022-10-22 DIAGNOSIS — M810 Age-related osteoporosis without current pathological fracture: Secondary | ICD-10-CM | POA: Insufficient documentation

## 2022-10-23 ENCOUNTER — Telehealth: Payer: Self-pay

## 2022-10-23 NOTE — Telephone Encounter (Signed)
Lvm for pt to return call in regards to lab results.  Per [place]: Patient considered osteopenic. Recommend weight bearing exercises, 1500 mg of calcium daily and 800 iu of vitamin D.

## 2022-10-23 NOTE — Telephone Encounter (Signed)
Patient returned office phone call, please call her cell number, [phone]

## 2022-10-23 NOTE — Telephone Encounter (Signed)
S/w pt & informed her in regards to bone density results.

## 2022-10-28 ENCOUNTER — Telehealth: Payer: Self-pay | Admitting: *Deleted

## 2022-10-29 ENCOUNTER — Other Ambulatory Visit: Payer: Self-pay

## 2022-10-31 ENCOUNTER — Ambulatory Visit: Payer: 59 | Admitting: Internal Medicine

## 2022-10-31 ENCOUNTER — Ambulatory Visit: Payer: 59

## 2022-12-05 DIAGNOSIS — M79651 Pain in right thigh: Secondary | ICD-10-CM | POA: Diagnosis not present

## 2022-12-08 DIAGNOSIS — E785 Hyperlipidemia, unspecified: Secondary | ICD-10-CM | POA: Diagnosis not present

## 2022-12-08 DIAGNOSIS — M25551 Pain in right hip: Secondary | ICD-10-CM | POA: Diagnosis not present

## 2022-12-08 DIAGNOSIS — M545 Low back pain, unspecified: Secondary | ICD-10-CM | POA: Diagnosis not present

## 2022-12-08 DIAGNOSIS — Z888 Allergy status to other drugs, medicaments and biological substances status: Secondary | ICD-10-CM | POA: Diagnosis not present

## 2022-12-08 DIAGNOSIS — M810 Age-related osteoporosis without current pathological fracture: Secondary | ICD-10-CM | POA: Diagnosis not present

## 2022-12-08 DIAGNOSIS — Z79899 Other long term (current) drug therapy: Secondary | ICD-10-CM | POA: Diagnosis not present

## 2022-12-08 DIAGNOSIS — M5441 Lumbago with sciatica, right side: Secondary | ICD-10-CM | POA: Diagnosis not present

## 2022-12-08 DIAGNOSIS — I10 Essential (primary) hypertension: Secondary | ICD-10-CM | POA: Diagnosis not present

## 2022-12-08 DIAGNOSIS — G43909 Migraine, unspecified, not intractable, without status migrainosus: Secondary | ICD-10-CM | POA: Diagnosis not present

## 2022-12-10 NOTE — Telephone Encounter (Signed)
I have spoke with my local [employer]. There is a discrepancy in the benefits & prior Joyce Simpson was not required in May. However it did require PA & pt patient is left responsible for the entire bill.   I have submitted the proper paperwork along with claim, insurance card, & proof to [place] today.

## 2022-12-11 NOTE — Telephone Encounter (Signed)
I received a call back from [place] to discuss this situation.  The [employer] claim states that they need patient to update her secondary insurance.  I called & spoke with Peter Congo & she hasn't had her own insurance in over 20 years. She is only on her husbands insurance through [employer]  I gave her the member service number given to me & asked her to call them to get this straightened out so that the could go back and cover the Prolia injection give previously.   [phone].  Pt stated that she will have her husband call as soon as he gets to work.

## 2022-12-12 ENCOUNTER — Other Ambulatory Visit: Payer: Self-pay

## 2022-12-12 DIAGNOSIS — M5416 Radiculopathy, lumbar region: Secondary | ICD-10-CM | POA: Diagnosis not present

## 2022-12-12 MED ORDER — GABAPENTIN 300 MG PO CAPS
300.0000 mg | ORAL_CAPSULE | Freq: Two times a day (BID) | ORAL | 0 refills | Status: DC
  Filled 2022-12-12: qty 30, 15d supply, fill #0

## 2022-12-18 DIAGNOSIS — M5416 Radiculopathy, lumbar region: Secondary | ICD-10-CM | POA: Diagnosis not present

## 2022-12-19 DIAGNOSIS — M5416 Radiculopathy, lumbar region: Secondary | ICD-10-CM | POA: Diagnosis not present

## 2022-12-21 DIAGNOSIS — M5416 Radiculopathy, lumbar region: Secondary | ICD-10-CM | POA: Diagnosis not present

## 2022-12-27 ENCOUNTER — Encounter: Payer: 59 | Admitting: Family Medicine

## 2023-01-04 ENCOUNTER — Other Ambulatory Visit: Payer: Self-pay

## 2023-01-04 DIAGNOSIS — M5416 Radiculopathy, lumbar region: Secondary | ICD-10-CM | POA: Diagnosis not present

## 2023-01-04 MED ORDER — TIZANIDINE HCL 4 MG PO TABS
4.0000 mg | ORAL_TABLET | Freq: Four times a day (QID) | ORAL | 0 refills | Status: DC | PRN
  Filled 2023-01-04: qty 120, 30d supply, fill #0

## 2023-01-04 MED ORDER — GABAPENTIN 300 MG PO CAPS
300.0000 mg | ORAL_CAPSULE | Freq: Three times a day (TID) | ORAL | 0 refills | Status: DC
  Filled 2023-01-04: qty 90, 30d supply, fill #0

## 2023-01-18 DIAGNOSIS — M5416 Radiculopathy, lumbar region: Secondary | ICD-10-CM | POA: Diagnosis not present

## 2023-01-29 ENCOUNTER — Encounter: Payer: 59 | Admitting: Family Medicine

## 2023-02-14 ENCOUNTER — Other Ambulatory Visit: Payer: Self-pay

## 2023-02-14 DIAGNOSIS — M5136 Other intervertebral disc degeneration, lumbar region: Secondary | ICD-10-CM | POA: Diagnosis not present

## 2023-02-14 DIAGNOSIS — M5416 Radiculopathy, lumbar region: Secondary | ICD-10-CM | POA: Diagnosis not present

## 2023-02-14 DIAGNOSIS — M545 Low back pain, unspecified: Secondary | ICD-10-CM | POA: Diagnosis not present

## 2023-02-14 MED ORDER — GABAPENTIN 300 MG PO CAPS
300.0000 mg | ORAL_CAPSULE | Freq: Two times a day (BID) | ORAL | 2 refills | Status: DC
  Filled 2023-02-14: qty 60, 30d supply, fill #0

## 2023-03-29 DIAGNOSIS — M5416 Radiculopathy, lumbar region: Secondary | ICD-10-CM | POA: Diagnosis not present

## 2023-04-09 DIAGNOSIS — M5116 Intervertebral disc disorders with radiculopathy, lumbar region: Secondary | ICD-10-CM | POA: Diagnosis not present

## 2023-04-16 ENCOUNTER — Ambulatory Visit: Payer: Commercial Managed Care - PPO | Attending: Neurosurgery | Admitting: Physical Therapy

## 2023-04-16 ENCOUNTER — Other Ambulatory Visit: Payer: Self-pay

## 2023-04-16 ENCOUNTER — Encounter: Payer: Self-pay | Admitting: Physical Therapy

## 2023-04-16 DIAGNOSIS — R262 Difficulty in walking, not elsewhere classified: Secondary | ICD-10-CM | POA: Insufficient documentation

## 2023-04-16 DIAGNOSIS — M79606 Pain in leg, unspecified: Secondary | ICD-10-CM | POA: Insufficient documentation

## 2023-04-16 DIAGNOSIS — M6281 Muscle weakness (generalized): Secondary | ICD-10-CM | POA: Diagnosis not present

## 2023-04-16 DIAGNOSIS — M545 Low back pain, unspecified: Secondary | ICD-10-CM | POA: Insufficient documentation

## 2023-04-16 NOTE — Therapy (Signed)
OUTPATIENT PHYSICAL THERAPY THORACOLUMBAR EVALUATION   Patient Name: Joyce Simpson MRN: [id number] DOB:[birth year], 62 y.o., female Today's Date: 04/16/2023  END OF SESSION:  PT End of Session - 04/16/23 0806     Visit Number 1    Number of Visits 24    Date for PT Re-Evaluation 07/09/23    PT Start Time 0805    PT Stop Time 0900    PT Time Calculation (min) 55 min    Activity Tolerance Patient tolerated treatment well    Behavior During Therapy [facility] for tasks assessed/performed             Past Medical History:  Diagnosis Date   COVID-19    12/19/19   Diverticulosis    Heart murmur    Hyperlipidemia    Hypertension    Migraines    Osteoporosis    Past Surgical History:  Procedure Laterality Date   ABDOMINAL HYSTERECTOMY     dub 2008/2009 h/o abnormal pap    BACK SURGERY     cervical spine [place]   CESAREAN SECTION     x2    COLONOSCOPY WITH PROPOFOL N/A 04/11/2018   Procedure: COLONOSCOPY WITH PROPOFOL;  Surgeon: Wyline Mood, MD;  Location: [facility] ENDOSCOPY;  Service: Gastroenterology;  Laterality: N/A;   COLONOSCOPY WITH PROPOFOL N/A 06/20/2018   Procedure: COLONOSCOPY WITH PROPOFOL;  Surgeon: Wyline Mood, MD;  Location: [facility] ENDOSCOPY;  Service: Gastroenterology;  Laterality: N/A;   COLONOSCOPY WITH PROPOFOL N/A 09/22/2021   Procedure: COLONOSCOPY WITH PROPOFOL;  Surgeon: Wyline Mood, MD;  Location: [facility] ENDOSCOPY;  Service: Gastroenterology;  Laterality: N/A;   Patient Active Problem List   Diagnosis Date Noted   Premature atrial contraction 02/28/2022   Hypokalemia 02/28/2022   Pruritus 11/03/2020   Hives 11/03/2020   Annual physical exam 08/02/2020   TMJ (temporomandibular joint disorder) 08/02/2020   Abnormal MRI, lumbar spine 08/02/2020   Lumbar radiculopathy 07/24/2020   Obesity (BMI 30-39.9) 07/18/2020   Left sided sciatica 07/18/2020   Prediabetes 07/18/2020   Osteoporosis 02/28/2018   Vitamin D deficiency 11/05/2017   Environmental and  seasonal allergies 03/29/2017   Dermatitis 07/12/2016   Acute low back pain without sciatica 05/29/2016   Numbness and tingling in right hand 05/29/2016   Cardiac murmur 10/31/2015   Hyperlipidemia 06/14/2015   Essential hypertension 01/29/2011   ABNORMAL ELECTROCARDIOGRAM 01/29/2011    PCP: pt transferring providers.    REFERRING PROVIDER: Tressie Stalker, MD  REFERRING DIAG: ICD-10-CM Intervertebral disc disorders with radiculopathy, lumbar region   Rationale for Evaluation and Treatment: Rehabilitation  THERAPY DIAG:  Muscle weakness (generalized)  Difficulty in walking, not elsewhere classified  Low back pain radiating down leg  ONSET DATE: 11/30/2022  SUBJECTIVE:  SUBJECTIVE STATEMENT: Pt reports pain the lateral R thigh starting in Dec 2023. Has received multiple steriod injections with only mild improvement for a couple days. Seing PT per MD request prior to performing surgery.   PERTINENT HISTORY:  From last neurosurgery visit:   "62 year old black female nonsmoker on whom I performed a C6-7 anterior cervical diskectomy fusion plating on 02/05/2011. She did well after that surgery and have not seen her in years.The patient returns today with a new problem. She tells me she began having pain in her right leg all on 11/30/2022 without notable precipitating events. The patient was seen at [facility] by several doctors. She had a lumbar MRI. She then had 2 lumbar epidural injections which helped " a few days" . When her pain recurred she was told she had a pinched nerve. She scheduled appointment for my opinion.During today's visit the patient is accompanied by her husband and relates the above information. She complains of pain mainly in her right leg and what sounds like the L4 distribution.  She says her right leg/knee gives out. Her symptoms are present more less all the time but worse at night. Her symptoms do not significantly worsened with standing walking. She does not have any radicular symptoms on the left. She has been treated with various medications including a prednisone taper, gabapentin, oxycodone, muscle relaxants, etcetera. She has not had any physical therapy or chiropractic care. She tells me her neck is doing well. "  PAIN:  Are you having pain? Yes: NPRS scale: 4.5/10 Pain location: lateral R leg  Pain description: nagging dull  ach. Can be nburning or tingling.  Aggravating factors: going up/down stairs Relieving factors: none  PRECAUTIONS: None  WEIGHT BEARING RESTRICTIONS: No  FALLS:  Has patient fallen in last 6 months? 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   PLOF: Independent and Independent with basic ADLs  PATIENT GOALS: relief in back pain   NEXT MD VISIT: July 2024   OBJECTIVE:   DIAGNOSTIC FINDINGS:  MRI - R lateral disc bulge at L4-L5  PATIENT SURVEYS:  FOTO 58   SCREENING FOR RED FLAGS: Bowel or bladder incontinence: No Spinal tumors: No Cauda equina syndrome: No Compression fracture: No Abdominal aneurysm: No  COGNITION: Overall cognitive status: Within functional limits for tasks assessed     SENSATION: WFL  MUSCLE LENGTH: Hamstrings: Right decreased lacking 30 deg full extension at knee deg; Left WFL lacking ~15 deg full extension   POSTURE: rounded shoulders, forward head, and weight shift right  PALPATION: Multiple trigger point in R paraspinals and QL. Trigger points found in Piriformis and Glute med/max. Hypermobile in vertebrae T10-L5.   LUMBAR ROM:   AROM eval  Flexion    Extension   Right lateral flexion   Left lateral flexion   Right rotation   Left rotation    (Blank rows = not tested)  LOWER EXTREMITY ROM:     Active  Right eval Left eval  Hip flexion Lacking ~ 15 deg  WFL  Hip extension Unable to lift beyond neutral in prone [facility]  Hip abduction    Hip adduction    Hip internal rotation    Hip external rotation    Knee flexion    Knee extension  Ankle dorsiflexion    Ankle plantarflexion    Ankle inversion    Ankle eversion     (Blank rows = not tested)  LOWER EXTREMITY MMT:    MMT Right eval Left eval  Hip flexion 4- 4+  Hip extension 3 4-  Hip abduction 4- 5  Hip adduction 4 4+  Hip internal rotation    Hip external rotation    Knee flexion 4 5  Knee extension 4 5  Ankle dorsiflexion 4 5  Ankle plantarflexion 3+ 4+  Ankle inversion    Ankle eversion     (Blank rows = not tested)  LUMBAR SPECIAL TESTS:  Prone instability test: Positive, Straight leg raise test: Positive, Single leg stance test: TBD, SI Compression/distraction test: Negative, and FABER test: Positive  FUNCTIONAL TESTS:  5 times sit to stand: 13.22 with UE support 15.44 with no UE support  Timed up and go (TUG): 10.3  6 minute walk test: 1240ft. Mild increase in pain on the RLE 10 meter walk test: 10.9sec  Functional gait assessment: to be completed on next visit  GAIT: Distance walked: 40 Assistive device utilized: None Level of assistance: Complete Independence Comments: noted lateral cervical and trunk flexion to the R with decreased pelvic rotation Bil.   TODAY'S TREATMENT:                                                                                                                              DATE: 04/16/2023   Eval and HEP provided see below.       PATIENT EDUCATION:  Education details: POC. Rehab potential. Pt educated throughout session about proper posture and technique with exercises. Improved exercise technique, movement at  target joints, use of target muscles after min to mod verbal, visual, tactile cues.  Person educated: Patient Education method: Explanation, Demonstration, and Handouts Education comprehension: verbalized understanding and needs further education  HOME EXERCISE PROGRAM: Access Code: [email or web address] URL: [email or web address] Date: 04/16/2023 Prepared by: Grier Rocher  Exercises - Supine Bridge  - 1 x daily - 7 x weekly - 3 sets - 10 reps - 2 hold - Supine Lower Trunk Rotation  - 1 x daily - 7 x weekly - 3 sets - 10 reps - 3 hold - Seated Piriformis Stretch with Trunk Bend  - 1 x daily - 7 x weekly - 3 sets - 5 reps - 15 hold  ASSESSMENT:  CLINICAL IMPRESSION: Patient is a 62 y.o. Female who was seen today for physical therapy evaluation and treatment for LBP with radiation into the RLE. Pt demonstrates increased pain with Straight leg raise test, faber test in lateral thigh, and Prone instability test.  Reduced strength on the RLE compared to the L. Pt also Noted to have trigger point on the R paraspinals in lumbar and lower thoracic region, QL, and piriformis/ glute med. Will benefit from Skilled intervention to reduce pain, improve strength,  to allow improved function to return to  PLOF and improved overall QoL.   OBJECTIVE IMPAIRMENTS: Abnormal gait, decreased activity tolerance, decreased balance, decreased endurance, decreased knowledge of condition, decreased mobility, difficulty walking, decreased strength, increased fascial restrictions, impaired perceived functional ability, improper body mechanics, and postural dysfunction.   ACTIVITY LIMITATIONS: carrying, lifting, standing, squatting, stairs, bed mobility, and locomotion level  PARTICIPATION LIMITATIONS: laundry, community activity, and occupation  PERSONAL FACTORS: Age, Fitness, and Past/current experiences are also affecting patient's functional outcome.   REHAB POTENTIAL: Good  CLINICAL DECISION MAKING:  Stable/uncomplicated  EVALUATION COMPLEXITY: Moderate   GOALS: Goals reviewed with patient? Yes   SHORT TERM GOALS: Target date: 05/28/2023    Patient will be independent in home exercise program to improve strength/mobility for better functional independence with ADLs. Baseline: Goal status: INITIAL   LONG TERM GOALS: Target date: 07/09/2023    Patient will increase FOTO score to equal to or greater than 68   to demonstrate statistically significant improvement in mobility and quality of life.  Baseline:  Goal status: INITIAL  2.  Patient (> 43 years old) will complete five times sit to stand test in < 10 seconds indicating an increased LE strength and improved balance. Baseline: 15.4sec Goal status: INITIAL  3.  Patient will increase 6 min walk test  to >1500 ft points to demonstrate decreased fall risk during functional activities Baseline: 1247ft. Mild increase in pain on the RLE Goal status: INITIAL  4.  Patient will increase 10 meter walk test to >1.88m/s as to improve gait speed for better community ambulation and to reduce fall risk. Baseline: 0.67m/s Goal status: INITIAL  5.  Patient will increase FGA >20 as to demonstrate reduced fall risk and improved dynamic gait balance for better safety with community/home ambulation.  Baseline: to be completed  Goal status: INITIAL  6.  Patient will reduce pain to 0-1/10 with functional activities throughout day to demonstrate improved function and reduce impairment  Baseline:5/10  Goal status: INITIAL   PLAN:  PT FREQUENCY: 1-2x/week  PT DURATION: 12 weeks  PLANNED INTERVENTIONS: Therapeutic exercises, Therapeutic activity, Neuromuscular re-education, Balance training, Gait training, Patient/Family education, Self Care, Joint mobilization, Joint manipulation, Stair training, Dry Needling, Electrical stimulation, Spinal manipulation, Spinal mobilization, Moist heat, Taping, Traction, and Manual therapy.  PLAN FOR NEXT  SESSION:   Complete FGA. Lumbar ROM as needed. Continue with core stabilization exercises, manual therapy Pain modulation for R sided radicular pain, RLE strengthening   Grier Rocher PT, DPT  Physical Therapist - [facility]  [facility]  11:28 AM 04/16/23

## 2023-04-17 NOTE — Therapy (Signed)
OUTPATIENT PHYSICAL THERAPY THORACOLUMBAR TREATMENT   Patient Name: Joyce Simpson MRN: [id number] DOB:1961/12/20, 62 y.o., female Today's Date: 04/18/2023  END OF SESSION:  PT End of Session - 04/18/23 0802     Visit Number 2    Number of Visits 24    Date for PT Re-Evaluation 07/09/23    PT Start Time 0802    PT Stop Time 0844    PT Time Calculation (min) 42 min    Activity Tolerance Patient tolerated treatment well    Behavior During Therapy [facility] for tasks assessed/performed              Past Medical History:  Diagnosis Date   COVID-19    12/19/19   Diverticulosis    Heart murmur    Hyperlipidemia    Hypertension    Migraines    Osteoporosis    Past Surgical History:  Procedure Laterality Date   ABDOMINAL HYSTERECTOMY     dub 2008/2009 h/o abnormal pap    BACK SURGERY     cervical spine  NS   CESAREAN SECTION     x2    COLONOSCOPY WITH PROPOFOL N/A 04/11/2018   Procedure: COLONOSCOPY WITH PROPOFOL;  Surgeon: Wyline Mood, MD;  Location: [facility] ENDOSCOPY;  Service: Gastroenterology;  Laterality: N/A;   COLONOSCOPY WITH PROPOFOL N/A 06/20/2018   Procedure: COLONOSCOPY WITH PROPOFOL;  Surgeon: Wyline Mood, MD;  Location: [facility] ENDOSCOPY;  Service: Gastroenterology;  Laterality: N/A;   COLONOSCOPY WITH PROPOFOL N/A 09/22/2021   Procedure: COLONOSCOPY WITH PROPOFOL;  Surgeon: Wyline Mood, MD;  Location: [facility] ENDOSCOPY;  Service: Gastroenterology;  Laterality: N/A;   Patient Active Problem List   Diagnosis Date Noted   Premature atrial contraction 02/28/2022   Hypokalemia 02/28/2022   Pruritus 11/03/2020   Hives 11/03/2020   Annual physical exam 08/02/2020   TMJ (temporomandibular joint disorder) 08/02/2020   Abnormal MRI, lumbar spine 08/02/2020   Lumbar radiculopathy 07/24/2020   Obesity (BMI 30-39.9) 07/18/2020   Left sided sciatica 07/18/2020   Prediabetes 07/18/2020   Osteoporosis 02/28/2018   Vitamin D deficiency 11/05/2017   Environmental and  seasonal allergies 03/29/2017   Dermatitis 07/12/2016   Acute low back pain without sciatica 05/29/2016   Numbness and tingling in right hand 05/29/2016   Cardiac murmur 10/31/2015   Hyperlipidemia 06/14/2015   Essential hypertension 01/29/2011   ABNORMAL ELECTROCARDIOGRAM 01/29/2011    PCP: pt transferring providers.    REFERRING PROVIDER: Tressie Stalker, MD  REFERRING DIAG: ICD-10-CM Intervertebral disc disorders with radiculopathy, lumbar region   Rationale for Evaluation and Treatment: Rehabilitation  THERAPY DIAG:  Muscle weakness (generalized)  Difficulty in walking, not elsewhere classified  Low back pain radiating down leg  ONSET DATE: 11/30/2022  SUBJECTIVE:  SUBJECTIVE STATEMENT: Patient reports 4/10 pain today.  Has been having difficulty at work with pain.  PERTINENT HISTORY:  62 year old black female nonsmoker on whom hada C6-7 anterior cervical diskectomy fusion plating on 02/05/2011. She did well after that surgery and have not seen her in years.The patient returns today with a new problem. She tells me she began having pain in her right leg all on 11/30/2022 without notable precipitating events. [facility] by [facility] doctors. She had a lumbar MRI. She then had 2 lumbar epidural injections which helped " a few days" . When her pain recurred she was told she had a pinched nerve. She scheduled appointment for my opinion.During today's visit the patient is accompanied by her husband and relates the above information. She complains of pain mainly in her right leg and what sounds like the L4 distribution. She says her right leg/knee gives out. Her symptoms are present more less all the time but worse at night. Her symptoms do not significantly worsened with  standing walking. She does not have any radicular symptoms on the left. She has been treated with various medications including a prednisone taper, gabapentin, oxycodone, muscle relaxants, etcetera. She has not had any physical therapy or chiropractic care. She tells me her neck is doing well.    PAIN:  Are you having pain? Yes: NPRS scale: 4.5/10 Pain location: lateral R leg  Pain description: nagging dull  ach. Can be nburning or tingling.  Aggravating factors: going up/down stairs Relieving factors: none  PRECAUTIONS: None  WEIGHT BEARING RESTRICTIONS: No  FALLS:  Has patient fallen in last 6 months? 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   PLOF: Independent and Independent with basic ADLs  PATIENT GOALS: relief in back pain   NEXT MD VISIT: July 2024   OBJECTIVE:   DIAGNOSTIC FINDINGS:  MRI - R lateral disc bulge at L4-L5  PATIENT SURVEYS:  FOTO 58   SCREENING FOR RED FLAGS: Bowel or bladder incontinence: No Spinal tumors: No Cauda equina syndrome: No Compression fracture: No Abdominal aneurysm: No  COGNITION: Overall cognitive status: Within functional limits for tasks assessed     SENSATION: WFL  MUSCLE LENGTH: Hamstrings: Right decreased lacking 30 deg full extension at knee deg; Left WFL lacking ~15 deg full extension   POSTURE: rounded shoulders, forward head, and weight shift right  PALPATION: Multiple trigger point in R paraspinals and QL. Trigger points found in Piriformis and Glute med/max. Hypermobile in vertebrae T10-L5.   LUMBAR ROM:   AROM eval  Flexion   Extension   Right lateral flexion   Left lateral flexion   Right rotation   Left rotation    (Blank rows = not tested)  LOWER EXTREMITY ROM:      Active  Right eval Left eval  Hip flexion Lacking ~ 15 deg  WFL  Hip extension Unable to lift beyond neutral in prone [facility]  Hip abduction    Hip adduction    Hip internal rotation    Hip external rotation    Knee flexion    Knee extension    Ankle dorsiflexion    Ankle plantarflexion    Ankle inversion    Ankle eversion     (Blank rows =  not tested)  LOWER EXTREMITY MMT:    MMT Right eval Left eval  Hip flexion 4- 4+  Hip extension 3 4-  Hip abduction 4- 5  Hip adduction 4 4+  Hip internal rotation    Hip external rotation    Knee flexion 4 5  Knee extension 4 5  Ankle dorsiflexion 4 5  Ankle plantarflexion 3+ 4+  Ankle inversion    Ankle eversion     (Blank rows = not tested)  LUMBAR SPECIAL TESTS:  Prone instability test: Positive, Straight leg raise test: Positive, Single leg stance test: TBD, SI Compression/distraction test: Negative, and FABER test: Positive  FUNCTIONAL TESTS:  5 times sit to stand: 13.22 with UE support 15.44 with no UE support  Timed up and go (TUG): 10.3  6 minute walk test: 123ft. Mild increase in pain on the RLE 10 meter walk test: 10.9sec  Functional gait assessment: to be completed on next visit  GAIT: Distance walked: 40 Assistive device utilized: None Level of assistance: Complete Independence Comments: noted lateral cervical and trunk flexion to the R with decreased pelvic rotation Bil.   TODAY'S TREATMENT:                                                                                                                              DATE: 04/18/2023   Manual; Hamstring lengthening stretch 30 seconds each LE Figure four stretch 30 seconds each LE  Grade II lumbar mobilizations -terminated due to pain  STM to lumbar paraspinals and quadratus lumborum R sided x 8 minutes SAD distraction inferior with belt RLE 2x30; lateralRLE 2x30 seconds    TherEx: Sciatic nerve glide 20x each side LE rotation 10x ; 2 sets Tra activation   with green swiss ball 10x  Posterior pelvic tilt 10x  with cues for coordination Posterior pelvic tilt with adduction ball squeeze 10x; very challenging   FGA: 17/30       PATIENT EDUCATION:  Education details: POC. Rehab potential. Pt educated throughout session about proper posture and technique with exercises. Improved exercise technique, movement at target joints, use of target muscles after min to mod verbal, visual, tactile cues.  Person educated: Patient Education method: Explanation, Demonstration, and Handouts Education comprehension: verbalized understanding and needs further education  HOME EXERCISE PROGRAM: Access Code: [email or web address] URL: [email or web address] Date: 04/16/2023 Prepared by: Grier Rocher  Exercises - Supine Bridge  - 1 x daily - 7 x weekly - 3 sets - 10 reps - 2 hold - Supine Lower Trunk Rotation  - 1 x daily - 7 x weekly - 3 sets - 10 reps - 3 hold - Seated Piriformis Stretch with Trunk Bend  - 1 x daily - 7 x weekly - 3 sets - 5 reps - 15 hold  ASSESSMENT:  CLINICAL IMPRESSION: FGA performed and goal updated. Patient educated on timing of seated position while at work for pain reduction. Agreeable to set timer for 30 minutes. Significant  muscle tension of R quadratus lumborum noted and improved with STM. Core strengthening introduced with patient tolerating well.  Will benefit from Skilled intervention to reduce pain, improve strength,  to allow improved function to return to PLOF and improved overall QoL.    OBJECTIVE IMPAIRMENTS: Abnormal gait, decreased activity tolerance, decreased balance, decreased endurance, decreased knowledge of condition, decreased mobility, difficulty walking, decreased strength, increased fascial restrictions, impaired perceived functional ability, improper body mechanics, and postural dysfunction.   ACTIVITY LIMITATIONS: carrying, lifting, standing, squatting, stairs, bed mobility, and locomotion  level  PARTICIPATION LIMITATIONS: laundry, community activity, and occupation  PERSONAL FACTORS: Age, Fitness, and Past/current experiences are also affecting patient's functional outcome.   REHAB POTENTIAL: Good  CLINICAL DECISION MAKING: Stable/uncomplicated  EVALUATION COMPLEXITY: Moderate   GOALS: Goals reviewed with patient? Yes   SHORT TERM GOALS: Target date: 05/28/2023    Patient will be independent in home exercise program to improve strength/mobility for better functional independence with ADLs. Baseline: Goal status: INITIAL   LONG TERM GOALS: Target date: 07/09/2023    Patient will increase FOTO score to equal to or greater than 68   to demonstrate statistically significant improvement in mobility and quality of life.  Baseline:  Goal status: INITIAL  2.  Patient (> 42 years old) will complete five times sit to stand test in < 10 seconds indicating an increased LE strength and improved balance. Baseline: 15.4sec Goal status: INITIAL  3.  Patient will increase 6 min walk test  to >1500 ft points to demonstrate decreased fall risk during functional activities Baseline: 1212ft. Mild increase in pain on the RLE Goal status: INITIAL  4.  Patient will increase 10 meter walk test to >1.25m/s as to improve gait speed for better community ambulation and to reduce fall risk. Baseline: 0.47m/s Goal status: INITIAL  5.  Patient will increase FGA >20 as to demonstrate reduced fall risk and improved dynamic gait balance for better safety with community/home ambulation.  Baseline: to be completed 4/18: 17/30  Goal status: INITIAL  6.  Patient will reduce pain to 0-1/10 with functional activities throughout day to demonstrate improved function and reduce impairment  Baseline:5/10  Goal status: INITIAL   PLAN:  PT FREQUENCY: 1-2x/week  PT DURATION: 12 weeks  PLANNED INTERVENTIONS: Therapeutic exercises, Therapeutic activity, Neuromuscular re-education, Balance  training, Gait training, Patient/Family education, Self Care, Joint mobilization, Joint manipulation, Stair training, Dry Needling, Electrical stimulation, Spinal manipulation, Spinal mobilization, Moist heat, Taping, Traction, and Manual therapy.  PLAN FOR NEXT SESSION:   Continue with core stabilization exercises, manual therapy Pain modulation for R sided radicular pain, RLE strengthening   Precious Bard PT  Physical Therapist - [facility]  [facility]  9:39 AM 04/18/23

## 2023-04-18 ENCOUNTER — Ambulatory Visit: Payer: Commercial Managed Care - PPO

## 2023-04-18 DIAGNOSIS — M6281 Muscle weakness (generalized): Secondary | ICD-10-CM

## 2023-04-18 DIAGNOSIS — R262 Difficulty in walking, not elsewhere classified: Secondary | ICD-10-CM | POA: Diagnosis not present

## 2023-04-18 DIAGNOSIS — M79606 Pain in leg, unspecified: Secondary | ICD-10-CM | POA: Diagnosis not present

## 2023-04-18 DIAGNOSIS — M545 Low back pain, unspecified: Secondary | ICD-10-CM | POA: Diagnosis not present

## 2023-04-18 NOTE — Therapy (Signed)
OUTPATIENT PHYSICAL THERAPY THORACOLUMBAR TREATMENT   Patient Name: Joyce Simpson MRN: [id number] DOB:1961/02/18, 62 y.o., female Today's Date: 04/23/2023  END OF SESSION:  PT End of Session - 04/23/23 0757     Visit Number 3    Number of Visits 24    Date for PT Re-Evaluation 07/09/23    PT Start Time 0800    PT Stop Time 0844    PT Time Calculation (min) 44 min    Activity Tolerance Patient tolerated treatment well    Behavior During Therapy [facility] for tasks assessed/performed               Past Medical History:  Diagnosis Date   COVID-19    12/19/19   Diverticulosis    Heart murmur    Hyperlipidemia    Hypertension    Migraines    Osteoporosis    Past Surgical History:  Procedure Laterality Date   ABDOMINAL HYSTERECTOMY     dub 2008/2009 h/o abnormal pap    BACK SURGERY     cervical spine [place]   CESAREAN SECTION     x2    COLONOSCOPY WITH PROPOFOL N/A 04/11/2018   Procedure: COLONOSCOPY WITH PROPOFOL;  Surgeon: Wyline Mood, MD;  Location: [facility] ENDOSCOPY;  Service: Gastroenterology;  Laterality: N/A;   COLONOSCOPY WITH PROPOFOL N/A 06/20/2018   Procedure: COLONOSCOPY WITH PROPOFOL;  Surgeon: Wyline Mood, MD;  Location: [facility] ENDOSCOPY;  Service: Gastroenterology;  Laterality: N/A;   COLONOSCOPY WITH PROPOFOL N/A 09/22/2021   Procedure: COLONOSCOPY WITH PROPOFOL;  Surgeon: Wyline Mood, MD;  Location: [facility] ENDOSCOPY;  Service: Gastroenterology;  Laterality: N/A;   Patient Active Problem List   Diagnosis Date Noted   Premature atrial contraction 02/28/2022   Hypokalemia 02/28/2022   Pruritus 11/03/2020   Hives 11/03/2020   Annual physical exam 08/02/2020   TMJ (temporomandibular joint disorder) 08/02/2020   Abnormal MRI, lumbar spine 08/02/2020   Lumbar radiculopathy 07/24/2020   Obesity (BMI 30-39.9) 07/18/2020   Left sided sciatica 07/18/2020   Prediabetes 07/18/2020   Osteoporosis 02/28/2018   Vitamin D deficiency 11/05/2017   Environmental and  seasonal allergies 03/29/2017   Dermatitis 07/12/2016   Acute low back pain without sciatica 05/29/2016   Numbness and tingling in right hand 05/29/2016   Cardiac murmur 10/31/2015   Hyperlipidemia 06/14/2015   Essential hypertension 01/29/2011   ABNORMAL ELECTROCARDIOGRAM 01/29/2011    PCP: pt transferring providers.    REFERRING PROVIDER: Tressie Stalker, MD  REFERRING DIAG: ICD-10-CM Intervertebral disc disorders with radiculopathy, lumbar region   Rationale for Evaluation and Treatment: Rehabilitation  THERAPY DIAG:  Muscle weakness (generalized)  Difficulty in walking, not elsewhere classified  Low back pain radiating down leg  ONSET DATE: 11/30/2022  SUBJECTIVE:  SUBJECTIVE STATEMENT: Patient reports her back was doing well but has flared back up again.   PERTINENT HISTORY:  62 year old black female nonsmoker on whom hada C6-7 anterior cervical diskectomy fusion plating on 02/05/2011. She did well after that surgery and have not seen her in years.The patient returns today with a new problem. She tells me she began having pain in her right leg all on 11/30/2022 without notable precipitating events. [facility] at [facility] by [facility] doctors. She had a lumbar MRI. She then had 2 lumbar epidural injections which helped " a few days" . When her pain recurred she was told she had a pinched nerve. She scheduled appointment for my opinion.During today's visit the patient is accompanied by her husband and relates the above information. She complains of pain mainly in her right leg and what sounds like the L4 distribution. She says her right leg/knee gives out. Her symptoms are present more less all the time but worse at night. Her symptoms do not significantly worsened with standing  walking. She does not have any radicular symptoms on the left. She has been treated with various medications including a prednisone taper, gabapentin, oxycodone, muscle relaxants, etcetera. She has not had any physical therapy or chiropractic care. She tells me her neck is doing well.    PAIN:  Are you having pain? Yes: NPRS scale: 6/10 Pain location: lateral R leg  Pain description: nagging dull  ach. Can be nburning or tingling.  Aggravating factors: going up/down stairs Relieving factors: none  PRECAUTIONS: None  WEIGHT BEARING RESTRICTIONS: No  FALLS:  Has patient fallen in last 6 months? 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   PLOF: Independent and Independent with basic ADLs  PATIENT GOALS: relief in back pain   NEXT MD VISIT: July 2024   OBJECTIVE:   DIAGNOSTIC FINDINGS:  MRI - R lateral disc bulge at L4-L5  PATIENT SURVEYS:  FOTO 58   SCREENING FOR RED FLAGS: Bowel or bladder incontinence: No Spinal tumors: No Cauda equina syndrome: No Compression fracture: No Abdominal aneurysm: No  COGNITION: Overall cognitive status: Within functional limits for tasks assessed     SENSATION: WFL  MUSCLE LENGTH: Hamstrings: Right decreased lacking 30 deg full extension at knee deg; Left WFL lacking ~15 deg full extension   POSTURE: rounded shoulders, forward head, and weight shift right  PALPATION: Multiple trigger point in R paraspinals and QL. Trigger points found in Piriformis and Glute med/max. Hypermobile in vertebrae T10-L5.   LUMBAR ROM:   AROM eval  Flexion   Extension   Right lateral flexion   Left lateral flexion   Right rotation   Left rotation    (Blank rows = not tested)  LOWER EXTREMITY ROM:     Active   Right eval Left eval  Hip flexion Lacking ~ 15 deg  WFL  Hip extension Unable to lift beyond neutral in prone [facility]  Hip abduction    Hip adduction    Hip internal rotation    Hip external rotation    Knee flexion    Knee extension    Ankle dorsiflexion    Ankle plantarflexion    Ankle inversion    Ankle eversion     (Blank rows =  not tested)  LOWER EXTREMITY MMT:    MMT Right eval Left eval  Hip flexion 4- 4+  Hip extension 3 4-  Hip abduction 4- 5  Hip adduction 4 4+  Hip internal rotation    Hip external rotation    Knee flexion 4 5  Knee extension 4 5  Ankle dorsiflexion 4 5  Ankle plantarflexion 3+ 4+  Ankle inversion    Ankle eversion     (Blank rows = not tested)  LUMBAR SPECIAL TESTS:  Prone instability test: Positive, Straight leg raise test: Positive, Single leg stance test: TBD, SI Compression/distraction test: Negative, and FABER test: Positive  FUNCTIONAL TESTS:  5 times sit to stand: 13.22 with UE support 15.44 with no UE support  Timed up and go (TUG): 10.3  6 minute walk test: 1259ft. Mild increase in pain on the RLE 10 meter walk test: 10.9sec  Functional gait assessment: to be completed on next visit  GAIT: Distance walked: 40 Assistive device utilized: None Level of assistance: Complete Independence Comments: noted lateral cervical and trunk flexion to the R with decreased pelvic rotation Bil.   TODAY'S TREATMENT:                                                                                                                              DATE: 04/23/2023   Manual; Hamstring lengthening stretch 30 seconds each LE Figure four stretch 30 seconds each LE  Grade I-II lumbar mobilizations x3 minutes STM to lumbar paraspinals and quadratus lumborum R sided x 8 minutes SAD distraction inferior with belt RLE 2x30; lateralRLE 2x30 seconds    TherEx: Sciatic nerve glide 20x each side LE rotation 10x ; 2 sets Tra activation  with green swiss ball  10x ; 2 sets Posterior pelvic tilt 10x  with cues for coordination Posterior pelvic tilt with adduction ball squeeze 10x; very challenging  RTB march 10x; 2 sets  RTB abduction 15x; 2 sets   Seated: Large swiss ball forward trunk rollout 10x  Standing:  Hamstring stair stretch 30 seconds x 2 trials each LE       PATIENT EDUCATION:  Education details: POC. Rehab potential. Pt educated throughout session about proper posture and technique with exercises. Improved exercise technique, movement at target joints, use of target muscles after min to mod verbal, visual, tactile cues.  Person educated: Patient Education method: Explanation, Demonstration, and Handouts Education comprehension: verbalized understanding and needs further education  HOME EXERCISE PROGRAM: Access Code: [email or web address] URL: [email or web address] Date: 04/16/2023 Prepared by: Grier Rocher  Exercises - Supine Bridge  - 1 x daily - 7 x weekly - 3 sets - 10 reps - 2 hold - Supine Lower Trunk Rotation  - 1 x daily - 7 x weekly - 3 sets - 10 reps - 3 hold - Seated Piriformis Stretch with Trunk Bend  - 1 x daily - 7 x weekly - 3 sets - 5 reps - 15 hold  ASSESSMENT:  CLINICAL IMPRESSION: Patient tolerates progressive strengthening exercises despite having increased low back pain. Significant muscle tension noted in R low back that improved with STM. Patient tolerated grade I-II mobilizations this session demonstrating progress. Significant tension of hamstrings noted bilaterally.  Will benefit from Skilled intervention to reduce pain, improve strength,  to allow improved function to return to PLOF and improved overall QoL.    OBJECTIVE IMPAIRMENTS: Abnormal gait, decreased activity tolerance, decreased balance, decreased endurance, decreased knowledge of condition, decreased mobility, difficulty walking, decreased strength, increased fascial restrictions, impaired perceived functional ability, improper body  mechanics, and postural dysfunction.   ACTIVITY LIMITATIONS: carrying, lifting, standing, squatting, stairs, bed mobility, and locomotion level  PARTICIPATION LIMITATIONS: laundry, community activity, and occupation  PERSONAL FACTORS: Age, Fitness, and Past/current experiences are also affecting patient's functional outcome.   REHAB POTENTIAL: Good  CLINICAL DECISION MAKING: Stable/uncomplicated  EVALUATION COMPLEXITY: Moderate   GOALS: Goals reviewed with patient? Yes   SHORT TERM GOALS: Target date: 05/28/2023    Patient will be independent in home exercise program to improve strength/mobility for better functional independence with ADLs. Baseline: Goal status: INITIAL   LONG TERM GOALS: Target date: 07/09/2023    Patient will increase FOTO score to equal to or greater than 68   to demonstrate statistically significant improvement in mobility and quality of life.  Baseline:  Goal status: INITIAL  2.  Patient (> 73 years old) will complete five times sit to stand test in < 10 seconds indicating an increased LE strength and improved balance. Baseline: 15.4sec Goal status: INITIAL  3.  Patient will increase 6 min walk test  to >1500 ft points to demonstrate decreased fall risk during functional activities Baseline: 1243ft. Mild increase in pain on the RLE Goal status: INITIAL  4.  Patient will increase 10 meter walk test to >1.42m/s as to improve gait speed for better community ambulation and to reduce fall risk. Baseline: 0.52m/s Goal status: INITIAL  5.  Patient will increase FGA >20 as to demonstrate reduced fall risk and improved dynamic gait balance for better safety with community/home ambulation.  Baseline: to be completed 4/18: 17/30  Goal status: INITIAL  6.  Patient will reduce pain to 0-1/10 with functional activities throughout day to demonstrate improved function and reduce impairment  Baseline:5/10  Goal status: INITIAL   PLAN:  PT FREQUENCY:  1-2x/week  PT DURATION: 12 weeks  PLANNED INTERVENTIONS: Therapeutic exercises, Therapeutic activity, Neuromuscular re-education, Balance training, Gait training, Patient/Family education, Self Care, Joint mobilization, Joint manipulation, Stair training, Dry Needling, Electrical stimulation, Spinal manipulation, Spinal mobilization, Moist heat, Taping, Traction, and Manual therapy.  PLAN FOR NEXT SESSION:   Continue with core stabilization exercises, manual therapy Pain modulation for R sided radicular pain, RLE strengthening   Precious Bard PT  Physical Therapist - [facility]  [facility]  8:45 AM 04/23/23

## 2023-04-19 ENCOUNTER — Other Ambulatory Visit: Payer: Self-pay

## 2023-04-23 ENCOUNTER — Ambulatory Visit: Payer: Commercial Managed Care - PPO

## 2023-04-23 DIAGNOSIS — M6281 Muscle weakness (generalized): Secondary | ICD-10-CM | POA: Diagnosis not present

## 2023-04-23 DIAGNOSIS — R262 Difficulty in walking, not elsewhere classified: Secondary | ICD-10-CM

## 2023-04-23 DIAGNOSIS — M79606 Pain in leg, unspecified: Secondary | ICD-10-CM | POA: Diagnosis not present

## 2023-04-23 DIAGNOSIS — M545 Low back pain, unspecified: Secondary | ICD-10-CM

## 2023-04-23 NOTE — Patient Instructions (Incomplete)
It was a pleasure meeting you today. Thank you for allowing me to take part in your health care.  Our goals for today as we discussed include:  Recommend Shingles vaccine.  This is a 2 dose series and can be given at your local pharmacy.  Please talk to your pharmacist about this.   Recent Dexa completed. Will check on Prolia injections  We will get some labs today.  If they are abnormal or we need to do something about them, I will call you.  If they are normal, I will send you a message on MyChart (if it is active) or a letter in the mail.  If you don't hear from [place] in 2 weeks, please call the office at the number below.   If you have any questions or concerns, please do not hesitate to call the office at [phone].  I look forward to our next visit and until then take care and stay safe.  Regards,   Dana Allan, MD   [facility]

## 2023-04-24 ENCOUNTER — Ambulatory Visit: Payer: Commercial Managed Care - PPO | Admitting: Family Medicine

## 2023-04-24 ENCOUNTER — Encounter: Payer: Self-pay | Admitting: Family Medicine

## 2023-04-24 ENCOUNTER — Other Ambulatory Visit: Payer: Self-pay

## 2023-04-24 ENCOUNTER — Other Ambulatory Visit: Payer: Self-pay | Admitting: Family Medicine

## 2023-04-24 VITALS — BP 122/80 | HR 76 | Temp 98.3°F | Ht 60.0 in | Wt 193.6 lb

## 2023-04-24 DIAGNOSIS — M810 Age-related osteoporosis without current pathological fracture: Secondary | ICD-10-CM

## 2023-04-24 DIAGNOSIS — E559 Vitamin D deficiency, unspecified: Secondary | ICD-10-CM | POA: Diagnosis not present

## 2023-04-24 DIAGNOSIS — I10 Essential (primary) hypertension: Secondary | ICD-10-CM | POA: Diagnosis not present

## 2023-04-24 DIAGNOSIS — Z6837 Body mass index (BMI) 37.0-37.9, adult: Secondary | ICD-10-CM

## 2023-04-24 DIAGNOSIS — E785 Hyperlipidemia, unspecified: Secondary | ICD-10-CM | POA: Diagnosis not present

## 2023-04-24 DIAGNOSIS — R7303 Prediabetes: Secondary | ICD-10-CM | POA: Diagnosis not present

## 2023-04-24 DIAGNOSIS — M5416 Radiculopathy, lumbar region: Secondary | ICD-10-CM

## 2023-04-24 DIAGNOSIS — E669 Obesity, unspecified: Secondary | ICD-10-CM | POA: Diagnosis not present

## 2023-04-24 DIAGNOSIS — Z1231 Encounter for screening mammogram for malignant neoplasm of breast: Secondary | ICD-10-CM

## 2023-04-24 LAB — LIPID PANEL
Cholesterol: 179 mg/dL (ref 0–200)
HDL: 59.7 mg/dL (ref >39.00)
LDL Cholesterol: 108 mg/dL — ABNORMAL HIGH (ref 0–99)
NonHDL: 119.37
Total CHOL/HDL Ratio: 3
Triglycerides: 55 mg/dL (ref 0.0–149.0)
VLDL: 11 mg/dL (ref 0.0–40.0)

## 2023-04-24 LAB — COMPREHENSIVE METABOLIC PANEL
ALT: 13 U/L (ref 0–35)
AST: 15 U/L (ref 0–37)
Albumin: 3.9 g/dL (ref 3.5–5.2)
Alkaline Phosphatase: 77 U/L (ref 39–117)
BUN: 10 mg/dL (ref 6–23)
CO2: 30 mEq/L (ref 19–32)
Calcium: 9.1 mg/dL (ref 8.4–10.5)
Chloride: 103 mEq/L (ref 96–112)
Creatinine, Ser: 0.83 mg/dL (ref 0.40–1.20)
GFR: 75.61 mL/min (ref >60.00)
Glucose, Bld: 89 mg/dL (ref 70–99)
Potassium: 3.6 mEq/L (ref 3.5–5.1)
Sodium: 141 mEq/L (ref 135–145)
Total Bilirubin: 0.5 mg/dL (ref 0.2–1.2)
Total Protein: 6.4 g/dL (ref 6.0–8.3)

## 2023-04-24 LAB — CBC WITH DIFFERENTIAL/PLATELET
Basophils Absolute: 0 10*3/uL (ref 0.0–0.1)
Basophils Relative: 0.4 % (ref 0.0–3.0)
Eosinophils Absolute: 0.1 10*3/uL (ref 0.0–0.7)
Eosinophils Relative: 1.8 % (ref 0.0–5.0)
HCT: 42.4 % (ref 36.0–46.0)
Hemoglobin: 14.1 g/dL (ref 12.0–15.0)
Lymphocytes Relative: 29.6 % (ref 12.0–46.0)
Lymphs Abs: 1.3 10*3/uL (ref 0.7–4.0)
MCHC: 33.3 g/dL (ref 30.0–36.0)
MCV: 85.7 fl (ref 78.0–100.0)
Monocytes Absolute: 0.5 10*3/uL (ref 0.1–1.0)
Monocytes Relative: 11.6 % (ref 3.0–12.0)
Neutro Abs: 2.6 10*3/uL (ref 1.4–7.7)
Neutrophils Relative %: 56.6 % (ref 43.0–77.0)
Platelets: 237 10*3/uL (ref 150.0–400.0)
RBC: 4.94 Mil/uL (ref 3.87–5.11)
RDW: 12.7 % (ref 11.5–15.5)
WBC: 4.6 10*3/uL (ref 4.0–10.5)

## 2023-04-24 LAB — TSH: TSH: 0.92 u[IU]/mL (ref 0.35–5.50)

## 2023-04-24 LAB — VITAMIN B12: Vitamin B-12: 394 pg/mL (ref 211–911)

## 2023-04-24 LAB — VITAMIN D 25 HYDROXY (VIT D DEFICIENCY, FRACTURES): VITD: 29.42 ng/mL — ABNORMAL LOW (ref 30.00–100.00)

## 2023-04-24 LAB — HEMOGLOBIN A1C: Hgb A1c MFr Bld: 5.8 % (ref 4.6–6.5)

## 2023-04-24 MED ORDER — VITAMIN D (ERGOCALCIFEROL) 1.25 MG (50000 UNIT) PO CAPS
50000.0000 [IU] | ORAL_CAPSULE | ORAL | 1 refills | Status: DC
  Filled 2023-04-24: qty 12, 84d supply, fill #0
  Filled 2023-07-16: qty 12, 84d supply, fill #1

## 2023-04-24 NOTE — Therapy (Signed)
OUTPATIENT PHYSICAL THERAPY THORACOLUMBAR TREATMENT   Patient Name: Joyce Simpson MRN: [id number] DOB:05-09-1961, 62 y.o., female Today's Date: 04/25/2023  END OF SESSION:  PT End of Session - 04/25/23 0801     Visit Number 4    Number of Visits 24    Date for PT Re-Evaluation 07/09/23    PT Start Time 0801    PT Stop Time 0844    PT Time Calculation (min) 43 min    Activity Tolerance Patient tolerated treatment well    Behavior During Therapy [facility] for tasks assessed/performed                Past Medical History:  Diagnosis Date   COVID-19    12/19/19   Diverticulosis    Heart murmur    Hyperlipidemia    Hypertension    Migraines    Osteoporosis    Past Surgical History:  Procedure Laterality Date   ABDOMINAL HYSTERECTOMY     dub 2008/2009 h/o abnormal pap    BACK SURGERY     cervical spine [place] [facility]   CESAREAN SECTION     x2    COLONOSCOPY WITH PROPOFOL N/A 04/11/2018   Procedure: COLONOSCOPY WITH PROPOFOL;  Surgeon: Wyline Mood, MD;  Location: [facility] ENDOSCOPY;  Service: Gastroenterology;  Laterality: N/A;   COLONOSCOPY WITH PROPOFOL N/A 06/20/2018   Procedure: COLONOSCOPY WITH PROPOFOL;  Surgeon: Wyline Mood, MD;  Location: [facility] ENDOSCOPY;  Service: Gastroenterology;  Laterality: N/A;   COLONOSCOPY WITH PROPOFOL N/A 09/22/2021   Procedure: COLONOSCOPY WITH PROPOFOL;  Surgeon: Wyline Mood, MD;  Location: [facility] ENDOSCOPY;  Service: Gastroenterology;  Laterality: N/A;   Patient Active Problem List   Diagnosis Date Noted   Premature atrial contraction 02/28/2022   Hypokalemia 02/28/2022   Pruritus 11/03/2020   Hives 11/03/2020   Annual physical exam 08/02/2020   TMJ (temporomandibular joint disorder) 08/02/2020   Abnormal MRI, lumbar spine 08/02/2020   Lumbar radiculopathy 07/24/2020   Obesity (BMI 30-39.9) 07/18/2020   Left sided sciatica 07/18/2020   Prediabetes 07/18/2020   Osteoporosis 02/28/2018   Vitamin D deficiency 11/05/2017   Environmental  and seasonal allergies 03/29/2017   Dermatitis 07/12/2016   Acute low back pain without sciatica 05/29/2016   Numbness and tingling in right hand 05/29/2016   Cardiac murmur 10/31/2015   Hyperlipidemia 06/14/2015   Essential hypertension 01/29/2011   ABNORMAL ELECTROCARDIOGRAM 01/29/2011    PCP: pt transferring providers.    REFERRING PROVIDER: Tressie Stalker, MD  REFERRING DIAG: ICD-10-CM Intervertebral disc disorders with radiculopathy, lumbar region   Rationale for Evaluation and Treatment: Rehabilitation  THERAPY DIAG:  Muscle weakness (generalized)  Difficulty in walking, not elsewhere classified  Low back pain radiating down leg  ONSET DATE: 11/30/2022  SUBJECTIVE:  SUBJECTIVE STATEMENT: Patient reports feeling very stiff today, was feeling better after last session.   PERTINENT HISTORY:  62 year old black female nonsmoker on whom hada C6-7 anterior cervical diskectomy fusion plating on 02/05/2011. She did well after that surgery and have not seen her in years.The patient returns today with a new problem. She tells me she began having pain in her right leg all on 11/30/2022 without notable precipitating events. The patient was seen at [facility] by several doctors. She had a lumbar MRI. She then had 2 lumbar epidural injections which helped " a few days" . When her pain recurred she was told she had a pinched nerve. She scheduled appointment for my opinion.During today's visit the patient is accompanied by her husband and relates the above information. She complains of pain mainly in her right leg and what sounds like the L4 distribution. She says her right leg/knee gives out. Her symptoms are present more less all the time but worse at night. Her symptoms do not significantly worsened with  standing walking. She does not have any radicular symptoms on the left. She has been treated with various medications including a prednisone taper, gabapentin, oxycodone, muscle relaxants, etcetera. She has not had any physical therapy or chiropractic care. She tells me her neck is doing well.    PAIN:  Are you having pain? Yes: NPRS scale: 6/10 Pain location: lateral R leg  Pain description: nagging dull  ach. Can be nburning or tingling.  Aggravating factors: going up/down stairs Relieving factors: none  PRECAUTIONS: None  WEIGHT BEARING RESTRICTIONS: No  FALLS:  Has patient fallen in last 6 months? 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   PLOF: Independent and Independent with basic ADLs  PATIENT GOALS: relief in back pain   NEXT MD VISIT: July 2024   OBJECTIVE:   DIAGNOSTIC FINDINGS:  MRI - R lateral disc bulge at L4-L5  PATIENT SURVEYS:  FOTO 58   SCREENING FOR RED FLAGS: Bowel or bladder incontinence: No Spinal tumors: No Cauda equina syndrome: No Compression fracture: No Abdominal aneurysm: No  COGNITION: Overall cognitive status: Within functional limits for tasks assessed     SENSATION: WFL  MUSCLE LENGTH: Hamstrings: Right decreased lacking 30 deg full extension at knee deg; Left WFL lacking ~15 deg full extension   POSTURE: rounded shoulders, forward head, and weight shift right  PALPATION: Multiple trigger point in R paraspinals and QL. Trigger points found in Piriformis and Glute med/max. Hypermobile in vertebrae T10-L5.   LUMBAR ROM:   AROM eval  Flexion   Extension   Right lateral flexion   Left lateral flexion   Right rotation   Left rotation    (Blank rows = not tested)  LOWER EXTREMITY ROM:      Active  Right eval Left eval  Hip flexion Lacking ~ 15 deg  WFL  Hip extension Unable to lift beyond neutral in prone [facility]  Hip abduction    Hip adduction    Hip internal rotation    Hip external rotation    Knee flexion    Knee extension    Ankle dorsiflexion    Ankle plantarflexion    Ankle inversion    Ankle eversion     (Blank rows = not  tested)  LOWER EXTREMITY MMT:    MMT Right eval Left eval  Hip flexion 4- 4+  Hip extension 3 4-  Hip abduction 4- 5  Hip adduction 4 4+  Hip internal rotation    Hip external rotation    Knee flexion 4 5  Knee extension 4 5  Ankle dorsiflexion 4 5  Ankle plantarflexion 3+ 4+  Ankle inversion    Ankle eversion     (Blank rows = not tested)  LUMBAR SPECIAL TESTS:  Prone instability test: Positive, Straight leg raise test: Positive, Single leg stance test: TBD, SI Compression/distraction test: Negative, and FABER test: Positive  FUNCTIONAL TESTS:  5 times sit to stand: 13.22 with UE support 15.44 with no UE support  Timed up and go (TUG): 10.3  6 minute walk test: 1257ft. Mild increase in pain on the RLE 10 meter walk test: 10.9sec  Functional gait assessment: to be completed on next visit  GAIT: Distance walked: 40 Assistive device utilized: None Level of assistance: Complete Independence Comments: noted lateral cervical and trunk flexion to the R with decreased pelvic rotation Bil.   TODAY'S TREATMENT:                                                                                                                              DATE: 04/25/2023   Manual; Hamstring lengthening stretch 30 seconds each LE Single knee to chest 60 seconds each LE Figure four stretch 30 seconds each LE  Grade I-II lumbar mobilizations x3 minutes STM to lumbar paraspinals and quadratus lumborum R sided x 8 minutes SAD distraction inferior with belt RLE 2x30; lateralRLE 2x30 seconds    TherEx: Sciatic nerve glide 20x each side LE rotation  10x ; 2 sets Tra activation  with green swiss ball 10x ; 2 sets Posterior pelvic tilt 10x  with cues for coordination Posterior pelvic tilt with adduction ball squeeze 10x; very challenging  RTB march 10x; 2 sets  RTB abduction 15x; 2 sets  Hamstring curl swiss ball 15x        PATIENT EDUCATION:  Education details: POC. Rehab potential. Pt educated throughout session about proper posture and technique with exercises. Improved exercise technique, movement at target joints, use of target muscles after min to mod verbal, visual, tactile cues.  Person educated: Patient Education method: Explanation, Demonstration, and Handouts Education comprehension: verbalized understanding and needs further education  HOME EXERCISE PROGRAM: Access Code: [email or web address] URL: [email or web address] Date: 04/16/2023 Prepared by: Grier Rocher  Exercises - Supine Bridge  - 1 x daily - 7 x weekly - 3 sets - 10 reps - 2 hold - Supine Lower Trunk Rotation  - 1 x daily - 7 x weekly - 3 sets - 10 reps - 3 hold - Seated Piriformis Stretch with Trunk Bend  - 1 x daily - 7 x weekly - 3 sets - 5 reps - 15 hold  ASSESSMENT:  CLINICAL IMPRESSION: Patient presents with increased tension  initially, has improved mobility by end of session. Core stabilization tolerated well however patient continues to be challenged with posterior tilt.   Will benefit from Skilled intervention to reduce pain, improve strength,  to allow improved function to return to PLOF and improved overall QoL.    OBJECTIVE IMPAIRMENTS: Abnormal gait, decreased activity tolerance, decreased balance, decreased endurance, decreased knowledge of condition, decreased mobility, difficulty walking, decreased strength, increased fascial restrictions, impaired perceived functional ability, improper body mechanics, and postural dysfunction.   ACTIVITY LIMITATIONS: carrying, lifting, standing, squatting, stairs, bed mobility, and locomotion  level  PARTICIPATION LIMITATIONS: laundry, community activity, and occupation  PERSONAL FACTORS: Age, Fitness, and Past/current experiences are also affecting patient's functional outcome.   REHAB POTENTIAL: Good  CLINICAL DECISION MAKING: Stable/uncomplicated  EVALUATION COMPLEXITY: Moderate   GOALS: Goals reviewed with patient? Yes   SHORT TERM GOALS: Target date: 05/28/2023    Patient will be independent in home exercise program to improve strength/mobility for better functional independence with ADLs. Baseline: Goal status: INITIAL   LONG TERM GOALS: Target date: 07/09/2023    Patient will increase FOTO score to equal to or greater than 68   to demonstrate statistically significant improvement in mobility and quality of life.  Baseline:  Goal status: INITIAL  2.  Patient (> 36 years old) will complete five times sit to stand test in < 10 seconds indicating an increased LE strength and improved balance. Baseline: 15.4sec Goal status: INITIAL  3.  Patient will increase 6 min walk test  to >1500 ft points to demonstrate decreased fall risk during functional activities Baseline: 1210ft. Mild increase in pain on the RLE Goal status: INITIAL  4.  Patient will increase 10 meter walk test to >1.33m/s as to improve gait speed for better community ambulation and to reduce fall risk. Baseline: 0.63m/s Goal status: INITIAL  5.  Patient will increase FGA >20 as to demonstrate reduced fall risk and improved dynamic gait balance for better safety with community/home ambulation.  Baseline: to be completed 4/18: 17/30  Goal status: INITIAL  6.  Patient will reduce pain to 0-1/10 with functional activities throughout day to demonstrate improved function and reduce impairment  Baseline:5/10  Goal status: INITIAL   PLAN:  PT FREQUENCY: 1-2x/week  PT DURATION: 12 weeks  PLANNED INTERVENTIONS: Therapeutic exercises, Therapeutic activity, Neuromuscular re-education, Balance  training, Gait training, Patient/Family education, Self Care, Joint mobilization, Joint manipulation, Stair training, Dry Needling, Electrical stimulation, Spinal manipulation, Spinal mobilization, Moist heat, Taping, Traction, and Manual therapy.  PLAN FOR NEXT SESSION:   Continue with core stabilization exercises, manual therapy Pain modulation for R sided radicular pain, RLE strengthening   Precious Bard PT  Physical Therapist - [facility]  [facility]  9:27 AM 04/25/23

## 2023-04-24 NOTE — Progress Notes (Signed)
SUBJECTIVE:   Chief Complaint  Patient presents with   Transitions Of Care   HPI Patient presents to clinic to transfer care.  No acute concerns today.  Hypertension Asymptomatic.  Currently takes Norvasc 5 mg and Hyzaar 50-12.5 mg daily.  Denies any visual changes, headaches, chest pain, shortness of breath or lower extremity edema.  Hyperlipidemia Prescribed Pravachol 20 mg daily but has not taken prescription.  Last LDL 115, goal less than 100.  Osteoporosis Initial T-score -2.9 from DEXA in 2018.  Had previously tried Fosamax but was unable to tolerate secondary to GI intolerance.  Received Prolia injection 10/2021 and 04/2022.  Repeat DEXA showed improvement in T-score, -1.9. Last Prolia injection 5/23.  She is not currently on vitamin D or calcium supplements.  PERTINENT PMH / PSH: Osteoporosis Hypertension Lumbar radiculopathy Hyperlipidemia  OBJECTIVE:  BP 122/80   Pulse 76   Temp 98.3 F (36.8 C)   Ht 5' (1.524 m)   Wt 193 lb 9.6 oz (87.8 kg)   SpO2 99%   BMI 37.81 kg/m    Physical Exam Vitals reviewed.  Constitutional:      General: She is not in acute distress.    Appearance: She is not ill-appearing.  HENT:     Head: Normocephalic.     Nose: Nose normal.  Eyes:     Conjunctiva/sclera: Conjunctivae normal.  Neck:     Thyroid: No thyromegaly or thyroid tenderness.  Cardiovascular:     Rate and Rhythm: Normal rate and regular rhythm.     Heart sounds: Normal heart sounds.  Pulmonary:     Effort: Pulmonary effort is normal.     Breath sounds: Normal breath sounds.  Abdominal:     General: Abdomen is flat. Bowel sounds are normal.     Palpations: Abdomen is soft.  Musculoskeletal:        General: Normal range of motion.     Cervical back: Normal range of motion.  Neurological:     Mental Status: She is alert and oriented to person, place, and time. Mental status is at baseline.  Psychiatric:        Mood and Affect: Mood normal.         Behavior: Behavior normal.        Thought Content: Thought content normal.        Judgment: Judgment normal.     ASSESSMENT/PLAN:  Essential hypertension Assessment & Plan: Chronic.  Stable.  Well-controlled on current medications. Continue Hyzaar 50-12.5 mg daily Continue amlodipine 5 mg daily Check labs Monitor BP at home.  Goal less than 140/90 per JNC 8 guidelines Follow-up in 6 months  Orders: -     CBC with Differential/Platelet -     Comprehensive metabolic panel -     TSH -     Vitamin B12  Class 2 obesity Assessment & Plan: Chronic.  Progressive.  BMI increased from previous visits. Check labs today Encouraged healthy lifestyle changes and increase activity  Orders: -     Hemoglobin A1c -     CBC with Differential/Platelet -     TSH -     Vitamin B12  Prediabetes Assessment & Plan: Chronic.  Stable.  Previous A1c 5.7. Recheck A1c  Orders: -     Hemoglobin A1c  Vitamin D deficiency Assessment & Plan: Chronic. Start vitamin D 1.25 mg weekly   Orders: -     VITAMIN D 25 Hydroxy (Vit-D Deficiency, Fractures)  Hyperlipidemia, unspecified hyperlipidemia type Assessment &  Plan: Chronic.  Was prescribed Pravachol 20 mg daily.  Had not picked up medication.  Denies any family history of CAD.   No indication at this time to restart Pravachol Consider calcium score at next visit Check fasting lipids  Orders: -     Lipid panel  Breast cancer screening by mammogram -     3D Screening Mammogram, Left and Right; Future  Lumbar radiculopathy Assessment & Plan: Chronic.  Stable.  Has been following with physical therapy and doing well. Follows with Dr. Lovell Sheehan at [facility] neurosurgery in [place].   Osteoporosis, unspecified osteoporosis type, unspecified pathological fracture presence Assessment & Plan: Chronic. 2018 DEXA showing T score 2.9.  Unable to tolerate Fosamax secondary to GI side effects.  Received 2 Prolia injections.  Last injection  04/2022.  Repeat DEXA 09/2022 showed improvement of T-score -1.9. Was unable to continue Prolia injections secondary to insurance not approving.  Per chart review patient was to reach out to insurance to discuss adding secondary insurance. Recommend calcium and vitamin D supplementation daily Plan to discuss with patient at next visit      HCM Recommend shingles vaccine Last Pap 07/2020.  NILM, HPV negative.  Due 08/24-26.  Patient aware to schedule appointment. Mammogram up-to-date.  Due 10/2023.  Referral sent today.  Patient called schedule for an appointment. Recent DEXA 10/23, T-score -2.9, improved from -1.9.  Repeat DEXA 09/2024 Colonoscopy up-to-date.  Recommend repeat surveillance in 5 years due to personal history of colon polyps.  Due 9/27 Tetanus up-to-date   PDMP reviewed  Request follow-up appointment in 1 to 2 months to discuss Prolia   Return in 6 months (on 10/24/2023), or if symptoms worsen or fail to improve, for PCP.  Dana Allan, MD

## 2023-04-25 ENCOUNTER — Encounter: Payer: Self-pay | Admitting: *Deleted

## 2023-04-25 ENCOUNTER — Ambulatory Visit: Payer: Commercial Managed Care - PPO

## 2023-04-25 DIAGNOSIS — M545 Low back pain, unspecified: Secondary | ICD-10-CM

## 2023-04-25 DIAGNOSIS — M6281 Muscle weakness (generalized): Secondary | ICD-10-CM

## 2023-04-25 DIAGNOSIS — R262 Difficulty in walking, not elsewhere classified: Secondary | ICD-10-CM

## 2023-04-25 DIAGNOSIS — M79606 Pain in leg, unspecified: Secondary | ICD-10-CM | POA: Diagnosis not present

## 2023-04-29 NOTE — Therapy (Signed)
OUTPATIENT PHYSICAL THERAPY THORACOLUMBAR TREATMENT   Patient Name: Joyce Simpson MRN: [id number] DOB:November 17, 1961, 62 y.o., female Today's Date: 04/30/2023  END OF SESSION:  PT End of Session - 04/30/23 0802     Visit Number 5    Number of Visits 24    Date for PT Re-Evaluation 07/09/23    PT Start Time 0800    PT Stop Time 0844    PT Time Calculation (min) 44 min    Activity Tolerance Patient tolerated treatment well    Behavior During Therapy [facility] for tasks assessed/performed                 Past Medical History:  Diagnosis Date   COVID-19    12/19/19   Diverticulosis    Heart murmur    Hyperlipidemia    Hypertension    Migraines    Osteoporosis    Past Surgical History:  Procedure Laterality Date   ABDOMINAL HYSTERECTOMY     dub 2008/2009 h/o abnormal pap    BACK SURGERY     cervical spine [place]   CESAREAN SECTION     x2    COLONOSCOPY WITH PROPOFOL N/A 04/11/2018   Procedure: COLONOSCOPY WITH PROPOFOL;  Surgeon: Wyline Mood, MD;  Location: [facility] ENDOSCOPY;  Service: Gastroenterology;  Laterality: N/A;   COLONOSCOPY WITH PROPOFOL N/A 06/20/2018   Procedure: COLONOSCOPY WITH PROPOFOL;  Surgeon: Wyline Mood, MD;  Location: [facility] ENDOSCOPY;  Service: Gastroenterology;  Laterality: N/A;   COLONOSCOPY WITH PROPOFOL N/A 09/22/2021   Procedure: COLONOSCOPY WITH PROPOFOL;  Surgeon: Wyline Mood, MD;  Location: [facility] ENDOSCOPY;  Service: Gastroenterology;  Laterality: N/A;   Patient Active Problem List   Diagnosis Date Noted   Premature atrial contraction 02/28/2022   Hypokalemia 02/28/2022   Pruritus 11/03/2020   Hives 11/03/2020   Annual physical exam 08/02/2020   TMJ (temporomandibular joint disorder) 08/02/2020   Abnormal MRI, lumbar spine 08/02/2020   Lumbar radiculopathy 07/24/2020   Obesity (BMI 30-39.9) 07/18/2020   Left sided sciatica 07/18/2020   Prediabetes 07/18/2020   Osteoporosis 02/28/2018   Vitamin D deficiency 11/05/2017   Environmental  and seasonal allergies 03/29/2017   Dermatitis 07/12/2016   Acute low back pain without sciatica 05/29/2016   Numbness and tingling in right hand 05/29/2016   Cardiac murmur 10/31/2015   Hyperlipidemia 06/14/2015   Essential hypertension 01/29/2011   ABNORMAL ELECTROCARDIOGRAM 01/29/2011    PCP: pt transferring providers.    REFERRING PROVIDER: Tressie Stalker, MD  REFERRING DIAG: ICD-10-CM Intervertebral disc disorders with radiculopathy, lumbar region   Rationale for Evaluation and Treatment: Rehabilitation  THERAPY DIAG:  Muscle weakness (generalized)  Difficulty in walking, not elsewhere classified  Low back pain radiating down leg  ONSET DATE: 11/30/2022  SUBJECTIVE:  SUBJECTIVE STATEMENT: Patient reports her pain is primarily in their leg, front of R leg.   PERTINENT HISTORY:  62 year old black female nonsmoker on whom hada C6-7 anterior cervical diskectomy fusion plating on 02/05/2011. She did well after that surgery and have not seen her in years.The patient returns today with a new problem. She tells me she began having pain in her right leg all on 11/30/2022 without notable precipitating events. The patient was seen at [facility] by several doctors. She had a lumbar MRI. She then had 2 lumbar epidural injections which helped " a few days" . When her pain recurred she was told she had a pinched nerve. She scheduled appointment for my opinion.During today's visit the patient is accompanied by her husband and relates the above information. She complains of pain mainly in her right leg and what sounds like the L4 distribution. She says her right leg/knee gives out. Her symptoms are present more less all the time but worse at night. Her symptoms do not significantly worsened with standing  walking. She does not have any radicular symptoms on the left. She has been treated with various medications including a prednisone taper, gabapentin, oxycodone, muscle relaxants, etcetera. She has not had any physical therapy or chiropractic care. She tells me her neck is doing well.    PAIN:  Are you having pain? Yes: NPRS scale: 6/10 Pain location: lateral R leg  Pain description: nagging dull  ach. Can be nburning or tingling.  Aggravating factors: going up/down stairs Relieving factors: none  PRECAUTIONS: None  WEIGHT BEARING RESTRICTIONS: No  FALLS:  Has patient fallen in last 6 months? 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   PLOF: Independent and Independent with basic ADLs  PATIENT GOALS: relief in back pain   NEXT MD VISIT: July 2024   OBJECTIVE:   DIAGNOSTIC FINDINGS:  MRI - R lateral disc bulge at L4-L5  PATIENT SURVEYS:  FOTO 58   SCREENING FOR RED FLAGS: Bowel or bladder incontinence: No Spinal tumors: No Cauda equina syndrome: No Compression fracture: No Abdominal aneurysm: No  COGNITION: Overall cognitive status: Within functional limits for tasks assessed     SENSATION: WFL  MUSCLE LENGTH: Hamstrings: Right decreased lacking 30 deg full extension at knee deg; Left WFL lacking ~15 deg full extension   POSTURE: rounded shoulders, forward head, and weight shift right  PALPATION: Multiple trigger point in R paraspinals and QL. Trigger points found in Piriformis and Glute med/max. Hypermobile in vertebrae T10-L5.   LUMBAR ROM:   AROM eval  Flexion   Extension   Right lateral flexion   Left lateral flexion   Right rotation   Left rotation    (Blank rows = not tested)  LOWER EXTREMITY ROM:     Active   Right eval Left eval  Hip flexion Lacking ~ 15 deg  WFL  Hip extension Unable to lift beyond neutral in prone [facility]  Hip abduction    Hip adduction    Hip internal rotation    Hip external rotation    Knee flexion    Knee extension    Ankle dorsiflexion    Ankle plantarflexion    Ankle inversion    Ankle eversion     (Blank rows =  not tested)  LOWER EXTREMITY MMT:    MMT Right eval Left eval  Hip flexion 4- 4+  Hip extension 3 4-  Hip abduction 4- 5  Hip adduction 4 4+  Hip internal rotation    Hip external rotation    Knee flexion 4 5  Knee extension 4 5  Ankle dorsiflexion 4 5  Ankle plantarflexion 3+ 4+  Ankle inversion    Ankle eversion     (Blank rows = not tested)  LUMBAR SPECIAL TESTS:  Prone instability test: Positive, Straight leg raise test: Positive, Single leg stance test: TBD, SI Compression/distraction test: Negative, and FABER test: Positive  FUNCTIONAL TESTS:  5 times sit to stand: 13.22 with UE support 15.44 with no UE support  Timed up and go (TUG): 10.3  6 minute walk test: 1265ft. Mild increase in pain on the RLE 10 meter walk test: 10.9sec  Functional gait assessment: to be completed on next visit  GAIT: Distance walked: 40 Assistive device utilized: None Level of assistance: Complete Independence Comments: noted lateral cervical and trunk flexion to the R with decreased pelvic rotation Bil.   TODAY'S TREATMENT:                                                                                                                              DATE: 04/30/2023   Manual; Hamstring lengthening stretch 30 seconds each LE Single knee to chest 60 seconds each LE Figure four stretch 30 seconds each LE  Grade I-II lumbar mobilizations x3 minutes STM to lumbar paraspinals and quadratus lumborum R sided x 8 minutes Hip flexor lengthening prone: 2x30 seconds   TherEx: Sciatic nerve glide 20x each side LE rotation 10x ; 2 sets Tra activation  with green  swiss ball 10x ;10 second holds TrA activation with green swiss ball with UE raises 10x each UE  TrA activation with green swiss ball with LE heel slide 10x each LE  Posterior pelvic tilt 10x  with cues for coordination Hamstring curl swiss ball 15x        PATIENT EDUCATION:  Education details: POC. Rehab potential. Pt educated throughout session about proper posture and technique with exercises. Improved exercise technique, movement at target joints, use of target muscles after min to mod verbal, visual, tactile cues.  Person educated: Patient Education method: Explanation, Demonstration, and Handouts Education comprehension: verbalized understanding and needs further education  HOME EXERCISE PROGRAM: Access Code: [email or web address] URL: [email or web address] Date: 04/16/2023 Prepared by: Grier Rocher  Exercises - Supine Bridge  - 1 x daily - 7 x weekly - 3 sets - 10 reps - 2 hold - Supine Lower Trunk Rotation  - 1 x daily - 7 x weekly - 3 sets - 10 reps - 3 hold - Seated Piriformis Stretch with Trunk Bend  - 1 x daily - 7 x weekly - 3 sets - 5 reps - 15 hold  ASSESSMENT:  CLINICAL IMPRESSION: Patient is able to increase  breathing with core activation demonstrating carryover between sessions. Tension in low back is improving however continues to have trigger points along iliac crest. Bilateral hip flexor tightness present.  She does fatigue with LE strengthening, particularly with hamstring curl. Patient remains highly motivated throughout session.  Will benefit from Skilled intervention to reduce pain, improve strength,  to allow improved function to return to PLOF and improved overall QoL.    OBJECTIVE IMPAIRMENTS: Abnormal gait, decreased activity tolerance, decreased balance, decreased endurance, decreased knowledge of condition, decreased mobility, difficulty walking, decreased strength, increased fascial restrictions, impaired perceived functional ability, improper body  mechanics, and postural dysfunction.   ACTIVITY LIMITATIONS: carrying, lifting, standing, squatting, stairs, bed mobility, and locomotion level  PARTICIPATION LIMITATIONS: laundry, community activity, and occupation  PERSONAL FACTORS: Age, Fitness, and Past/current experiences are also affecting patient's functional outcome.   REHAB POTENTIAL: Good  CLINICAL DECISION MAKING: Stable/uncomplicated  EVALUATION COMPLEXITY: Moderate   GOALS: Goals reviewed with patient? Yes   SHORT TERM GOALS: Target date: 05/28/2023    Patient will be independent in home exercise program to improve strength/mobility for better functional independence with ADLs. Baseline: Goal status: INITIAL   LONG TERM GOALS: Target date: 07/09/2023    Patient will increase FOTO score to equal to or greater than 68   to demonstrate statistically significant improvement in mobility and quality of life.  Baseline:  Goal status: INITIAL  2.  Patient (> 73 years old) will complete five times sit to stand test in < 10 seconds indicating an increased LE strength and improved balance. Baseline: 15.4sec Goal status: INITIAL  3.  Patient will increase 6 min walk test  to >1500 ft points to demonstrate decreased fall risk during functional activities Baseline: 1251ft. Mild increase in pain on the RLE Goal status: INITIAL  4.  Patient will increase 10 meter walk test to >1.30m/s as to improve gait speed for better community ambulation and to reduce fall risk. Baseline: 0.27m/s Goal status: INITIAL  5.  Patient will increase FGA >20 as to demonstrate reduced fall risk and improved dynamic gait balance for better safety with community/home ambulation.  Baseline: to be completed 4/18: 17/30  Goal status: INITIAL  6.  Patient will reduce pain to 0-1/10 with functional activities throughout day to demonstrate improved function and reduce impairment  Baseline:5/10  Goal status: INITIAL   PLAN:  PT FREQUENCY:  1-2x/week  PT DURATION: 12 weeks  PLANNED INTERVENTIONS: Therapeutic exercises, Therapeutic activity, Neuromuscular re-education, Balance training, Gait training, Patient/Family education, Self Care, Joint mobilization, Joint manipulation, Stair training, Dry Needling, Electrical stimulation, Spinal manipulation, Spinal mobilization, Moist heat, Taping, Traction, and Manual therapy.  PLAN FOR NEXT SESSION:   Continue with core stabilization exercises, manual therapy Pain modulation for R sided radicular pain, RLE strengthening   Precious Bard PT  Physical Therapist - [facility]  [facility]  8:47 AM 04/30/23

## 2023-04-30 ENCOUNTER — Ambulatory Visit: Payer: Commercial Managed Care - PPO

## 2023-04-30 DIAGNOSIS — M79606 Pain in leg, unspecified: Secondary | ICD-10-CM | POA: Diagnosis not present

## 2023-04-30 DIAGNOSIS — R262 Difficulty in walking, not elsewhere classified: Secondary | ICD-10-CM

## 2023-04-30 DIAGNOSIS — M6281 Muscle weakness (generalized): Secondary | ICD-10-CM | POA: Diagnosis not present

## 2023-04-30 DIAGNOSIS — M545 Low back pain, unspecified: Secondary | ICD-10-CM | POA: Diagnosis not present

## 2023-05-01 NOTE — Therapy (Signed)
OUTPATIENT PHYSICAL THERAPY THORACOLUMBAR TREATMENT   Patient Name: Joyce Simpson MRN: [id number] DOB:12/24/1961, 62 y.o., female Today's Date: 05/02/2023  END OF SESSION:  PT End of Session - 05/02/23 0714     Visit Number 6    Number of Visits 24    Date for PT Re-Evaluation 07/09/23    PT Start Time 0715    PT Stop Time 0759    PT Time Calculation (min) 44 min    Activity Tolerance Patient tolerated treatment well    Behavior During Therapy [facility] for tasks assessed/performed                  Past Medical History:  Diagnosis Date   COVID-19    12/19/19   Diverticulosis    Heart murmur    Hyperlipidemia    Hypertension    Migraines    Osteoporosis    Past Surgical History:  Procedure Laterality Date   ABDOMINAL HYSTERECTOMY     dub 2008/2009 h/o abnormal pap    BACK SURGERY     cervical spine [place] NS   CESAREAN SECTION     x2    COLONOSCOPY WITH PROPOFOL N/A 04/11/2018   Procedure: COLONOSCOPY WITH PROPOFOL;  Surgeon: Wyline Mood, MD;  Location: [facility] ENDOSCOPY;  Service: Gastroenterology;  Laterality: N/A;   COLONOSCOPY WITH PROPOFOL N/A 06/20/2018   Procedure: COLONOSCOPY WITH PROPOFOL;  Surgeon: Wyline Mood, MD;  Location: [facility] ENDOSCOPY;  Service: Gastroenterology;  Laterality: N/A;   COLONOSCOPY WITH PROPOFOL N/A 09/22/2021   Procedure: COLONOSCOPY WITH PROPOFOL;  Surgeon: Wyline Mood, MD;  Location: [facility] ENDOSCOPY;  Service: Gastroenterology;  Laterality: N/A;   Patient Active Problem List   Diagnosis Date Noted   Premature atrial contraction 02/28/2022   Hypokalemia 02/28/2022   Pruritus 11/03/2020   Hives 11/03/2020   Annual physical exam 08/02/2020   TMJ (temporomandibular joint disorder) 08/02/2020   Abnormal MRI, lumbar spine 08/02/2020   Lumbar radiculopathy 07/24/2020   Obesity (BMI 30-39.9) 07/18/2020   Left sided sciatica 07/18/2020   Prediabetes 07/18/2020   Osteoporosis 02/28/2018   Vitamin D deficiency 11/05/2017   Environmental  and seasonal allergies 03/29/2017   Dermatitis 07/12/2016   Acute low back pain without sciatica 05/29/2016   Numbness and tingling in right hand 05/29/2016   Cardiac murmur 10/31/2015   Hyperlipidemia 06/14/2015   Essential hypertension 01/29/2011   ABNORMAL ELECTROCARDIOGRAM 01/29/2011    PCP: pt transferring providers.    REFERRING PROVIDER: Tressie Stalker, MD  REFERRING DIAG: ICD-10-CM Intervertebral disc disorders with radiculopathy, lumbar region   Rationale for Evaluation and Treatment: Rehabilitation  THERAPY DIAG:  Muscle weakness (generalized)  Difficulty in walking, not elsewhere classified  Low back pain radiating down leg  ONSET DATE: 11/30/2022  SUBJECTIVE:  SUBJECTIVE STATEMENT: Patient reports no changes.  No changes in pain.   PERTINENT HISTORY:  62 year old black female nonsmoker on whom hada C6-7 anterior cervical diskectomy fusion plating on 02/05/2011. She did well after that surgery and have not seen her in years.The patient returns today with a new problem. She tells me she began having pain in her right leg all on 11/30/2022 without notable precipitating events. The patient was seen at [facility] by several doctors. She had a lumbar MRI. She then had 2 lumbar epidural injections which helped " a few days" . When her pain recurred she was told she had a pinched nerve. She scheduled appointment for my opinion.During today's visit the patient is accompanied by her husband and relates the above information. She complains of pain mainly in her right leg and what sounds like the L4 distribution. She says her right leg/knee gives out. Her symptoms are present more less all the time but worse at night. Her symptoms do not significantly worsened with standing walking. She does not  have any radicular symptoms on the left. She has been treated with various medications including a prednisone taper, gabapentin, oxycodone, muscle relaxants, etcetera. She has not had any physical therapy or chiropractic care. She tells me her neck is doing well.    PAIN:  Are you having pain? Yes: NPRS scale: 6/10 Pain location: lateral R leg  Pain description: nagging dull  ach. Can be nburning or tingling.  Aggravating factors: going up/down stairs Relieving factors: none  PRECAUTIONS: None  WEIGHT BEARING RESTRICTIONS: No  FALLS:  Has patient fallen in last 6 months? 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   PLOF: Independent and Independent with basic ADLs  PATIENT GOALS: relief in back pain   NEXT MD VISIT: July 2024   OBJECTIVE:   DIAGNOSTIC FINDINGS:  MRI - R lateral disc bulge at L4-L5  PATIENT SURVEYS:  FOTO 58   SCREENING FOR RED FLAGS: Bowel or bladder incontinence: No Spinal tumors: No Cauda equina syndrome: No Compression fracture: No Abdominal aneurysm: No  COGNITION: Overall cognitive status: Within functional limits for tasks assessed     SENSATION: WFL  MUSCLE LENGTH: Hamstrings: Right decreased lacking 30 deg full extension at knee deg; Left WFL lacking ~15 deg full extension   POSTURE: rounded shoulders, forward head, and weight shift right  PALPATION: Multiple trigger point in R paraspinals and QL. Trigger points found in Piriformis and Glute med/max. Hypermobile in vertebrae T10-L5.   LUMBAR ROM:   AROM eval  Flexion   Extension   Right lateral flexion   Left lateral flexion   Right rotation   Left rotation    (Blank rows = not tested)  LOWER EXTREMITY ROM:     Active  Right eval Left eval   Hip flexion Lacking ~ 15 deg  WFL  Hip extension Unable to lift beyond neutral in prone [facility]  Hip abduction    Hip adduction    Hip internal rotation    Hip external rotation    Knee flexion    Knee extension    Ankle dorsiflexion    Ankle plantarflexion    Ankle inversion    Ankle eversion     (Blank rows = not tested)  LOWER  EXTREMITY MMT:    MMT Right eval Left eval  Hip flexion 4- 4+  Hip extension 3 4-  Hip abduction 4- 5  Hip adduction 4 4+  Hip internal rotation    Hip external rotation    Knee flexion 4 5  Knee extension 4 5  Ankle dorsiflexion 4 5  Ankle plantarflexion 3+ 4+  Ankle inversion    Ankle eversion     (Blank rows = not tested)  LUMBAR SPECIAL TESTS:  Prone instability test: Positive, Straight leg raise test: Positive, Single leg stance test: TBD, SI Compression/distraction test: Negative, and FABER test: Positive  FUNCTIONAL TESTS:  5 times sit to stand: 13.22 with UE support 15.44 with no UE support  Timed up and go (TUG): 10.3  6 minute walk test: 1223ft. Mild increase in pain on the RLE 10 meter walk test: 10.9sec  Functional gait assessment: to be completed on next visit  GAIT: Distance walked: 40 Assistive device utilized: None Level of assistance: Complete Independence Comments: noted lateral cervical and trunk flexion to the R with decreased pelvic rotation Bil.   TODAY'S TREATMENT:                                                                                                                              DATE: 05/02/2023   Manual; Hamstring lengthening stretch 30 seconds each LE Single knee to chest 60 seconds each LE Figure four stretch 30 seconds each LE  Grade I-II lumbar mobilizations x3 minutes STM to lumbar paraspinals and quadratus lumborum R sided x 8 minutes Hip flexor lengthening prone: 3x30 seconds  Rolling stick to R quad x 4 minutes  TherEx: Sciatic nerve glide 20x each side LE rotation 10x ; 2 sets Posterior  pelvic tilt 10x  with cues for coordination Open prayer stretch 3x30 seconds Standing TrA activation 10x pressing into swiss ball  Standing green swiss ball L stretch 10x         PATIENT EDUCATION:  Education details: POC. Rehab potential. Pt educated throughout session about proper posture and technique with exercises. Improved exercise technique, movement at target joints, use of target muscles after min to mod verbal, visual, tactile cues.  Person educated: Patient Education method: Explanation, Demonstration, and Handouts Education comprehension: verbalized understanding and needs further education  HOME EXERCISE PROGRAM: Access Code: [email or web address] URL: [email or web address] Date: 04/16/2023 Prepared by: Grier Rocher  Exercises - Supine Bridge  - 1 x daily - 7 x weekly - 3 sets - 10 reps - 2 hold - Supine Lower Trunk Rotation  - 1 x daily - 7 x weekly - 3 sets - 10 reps - 3 hold - Seated Piriformis Stretch with Trunk Bend  - 1 x daily - 7 x weekly - 3 sets - 5 reps - 15 hold  ASSESSMENT:  CLINICAL IMPRESSION: Patient tolerates progressive core stabilization in standing this session which is challenging for patient. She is improving with her ability to perform  posterior pelvic tilt but continues to be challenged. Her R quad is more limited this session but improves with rolling stick.  Will benefit from Skilled intervention to reduce pain, improve strength,  to allow improved function to return to PLOF and improved overall QoL.    OBJECTIVE IMPAIRMENTS: Abnormal gait, decreased activity tolerance, decreased balance, decreased endurance, decreased knowledge of condition, decreased mobility, difficulty walking, decreased strength, increased fascial restrictions, impaired perceived functional ability, improper body mechanics, and postural dysfunction.   ACTIVITY LIMITATIONS: carrying, lifting, standing, squatting, stairs, bed mobility, and locomotion  level  PARTICIPATION LIMITATIONS: laundry, community activity, and occupation  PERSONAL FACTORS: Age, Fitness, and Past/current experiences are also affecting patient's functional outcome.   REHAB POTENTIAL: Good  CLINICAL DECISION MAKING: Stable/uncomplicated  EVALUATION COMPLEXITY: Moderate   GOALS: Goals reviewed with patient? Yes   SHORT TERM GOALS: Target date: 05/28/2023    Patient will be independent in home exercise program to improve strength/mobility for better functional independence with ADLs. Baseline: Goal status: INITIAL   LONG TERM GOALS: Target date: 07/09/2023    Patient will increase FOTO score to equal to or greater than 68   to demonstrate statistically significant improvement in mobility and quality of life.  Baseline:  Goal status: INITIAL  2.  Patient (> 93 years old) will complete five times sit to stand test in < 10 seconds indicating an increased LE strength and improved balance. Baseline: 15.4sec Goal status: INITIAL  3.  Patient will increase 6 min walk test  to >1500 ft points to demonstrate decreased fall risk during functional activities Baseline: 1253ft. Mild increase in pain on the RLE Goal status: INITIAL  4.  Patient will increase 10 meter walk test to >1.65m/s as to improve gait speed for better community ambulation and to reduce fall risk. Baseline: 0.14m/s Goal status: INITIAL  5.  Patient will increase FGA >20 as to demonstrate reduced fall risk and improved dynamic gait balance for better safety with community/home ambulation.  Baseline: to be completed 4/18: 17/30  Goal status: INITIAL  6.  Patient will reduce pain to 0-1/10 with functional activities throughout day to demonstrate improved function and reduce impairment  Baseline:5/10  Goal status: INITIAL   PLAN:  PT FREQUENCY: 1-2x/week  PT DURATION: 12 weeks  PLANNED INTERVENTIONS: Therapeutic exercises, Therapeutic activity, Neuromuscular re-education, Balance  training, Gait training, Patient/Family education, Self Care, Joint mobilization, Joint manipulation, Stair training, Dry Needling, Electrical stimulation, Spinal manipulation, Spinal mobilization, Moist heat, Taping, Traction, and Manual therapy.  PLAN FOR NEXT SESSION:   Continue with core stabilization exercises, manual therapy Pain modulation for R sided radicular pain, RLE strengthening   Precious Bard PT  Physical Therapist - [facility]  [facility]  7:59 AM 05/02/23

## 2023-05-02 ENCOUNTER — Ambulatory Visit: Payer: Commercial Managed Care - PPO | Attending: Neurosurgery

## 2023-05-02 DIAGNOSIS — M6281 Muscle weakness (generalized): Secondary | ICD-10-CM

## 2023-05-02 DIAGNOSIS — R262 Difficulty in walking, not elsewhere classified: Secondary | ICD-10-CM | POA: Diagnosis not present

## 2023-05-02 DIAGNOSIS — M545 Low back pain, unspecified: Secondary | ICD-10-CM | POA: Diagnosis not present

## 2023-05-02 DIAGNOSIS — M79606 Pain in leg, unspecified: Secondary | ICD-10-CM

## 2023-05-04 ENCOUNTER — Encounter: Payer: Self-pay | Admitting: Family Medicine

## 2023-05-04 ENCOUNTER — Telehealth: Payer: Self-pay | Admitting: Family Medicine

## 2023-05-04 DIAGNOSIS — Z1231 Encounter for screening mammogram for malignant neoplasm of breast: Secondary | ICD-10-CM | POA: Insufficient documentation

## 2023-05-04 DIAGNOSIS — E669 Obesity, unspecified: Secondary | ICD-10-CM | POA: Insufficient documentation

## 2023-05-04 NOTE — Assessment & Plan Note (Signed)
Chronic. Start vitamin D 1.25 mg weekly

## 2023-05-04 NOTE — Assessment & Plan Note (Signed)
Chronic.  Stable.  Well-controlled on current medications. Continue Hyzaar 50-12.5 mg daily Continue amlodipine 5 mg daily Check labs Monitor BP at home.  Goal less than 140/90 per JNC 8 guidelines Follow-up in 6 months

## 2023-05-04 NOTE — Assessment & Plan Note (Signed)
Chronic.  Was prescribed Pravachol 20 mg daily.  Had not picked up medication.  Denies any family history of CAD.   No indication at this time to restart Pravachol Consider calcium score at next visit Check fasting lipids

## 2023-05-04 NOTE — Assessment & Plan Note (Signed)
Chronic.  Stable.  Previous A1c 5.7. Recheck A1c

## 2023-05-04 NOTE — Assessment & Plan Note (Addendum)
Chronic. 2018 DEXA showing T score 2.9.  Unable to tolerate Fosamax secondary to GI side effects.  Received 2 Prolia injections.  Last injection 04/2022.  Repeat DEXA 09/2022 showed improvement of T-score -1.9. Was unable to continue Prolia injections secondary to insurance not approving.  Per chart review patient was to reach out to insurance to discuss adding secondary insurance. Recommend calcium and vitamin D supplementation daily Plan to discuss with patient at next visit

## 2023-05-04 NOTE — Assessment & Plan Note (Signed)
Chronic.  Progressive.  BMI increased from previous visits. Check labs today Encouraged healthy lifestyle changes and increase activity

## 2023-05-04 NOTE — Assessment & Plan Note (Addendum)
Chronic.  Stable.  Has been following with physical therapy and doing well. Follows with Dr. Lovell Sheehan at [facility] neurosurgery in [place].

## 2023-05-05 NOTE — Telephone Encounter (Signed)
err

## 2023-05-06 ENCOUNTER — Ambulatory Visit: Payer: Commercial Managed Care - PPO

## 2023-05-06 DIAGNOSIS — M545 Low back pain, unspecified: Secondary | ICD-10-CM | POA: Diagnosis not present

## 2023-05-06 DIAGNOSIS — M79606 Pain in leg, unspecified: Secondary | ICD-10-CM | POA: Diagnosis not present

## 2023-05-06 DIAGNOSIS — M6281 Muscle weakness (generalized): Secondary | ICD-10-CM

## 2023-05-06 DIAGNOSIS — R262 Difficulty in walking, not elsewhere classified: Secondary | ICD-10-CM

## 2023-05-06 NOTE — Telephone Encounter (Signed)
LVM for pt to return call

## 2023-05-06 NOTE — Telephone Encounter (Signed)
Pt called back and she stated that she did not have to call her insurance company about prolia the office did

## 2023-05-06 NOTE — Telephone Encounter (Signed)
Patient scheduled for 06/06/23/.  FYI - thanks!

## 2023-05-06 NOTE — Therapy (Signed)
OUTPATIENT PHYSICAL THERAPY THORACOLUMBAR TREATMENT   Patient Name: Joyce Simpson MRN: [id number] DOB:1961-05-01, 62 y.o., female Today's Date: 05/06/2023  END OF SESSION:  PT End of Session - 05/06/23 0807     Visit Number 7    Number of Visits 24    Date for PT Re-Evaluation 07/09/23    PT Start Time 0803    Activity Tolerance Patient tolerated treatment well    Behavior During Therapy [facility] for tasks assessed/performed                   Past Medical History:  Diagnosis Date   COVID-19    12/19/19   Diverticulosis    Heart murmur    Hyperlipidemia    Hypertension    Migraines    Osteoporosis    Past Surgical History:  Procedure Laterality Date   ABDOMINAL HYSTERECTOMY     dub 2008/2009 h/o abnormal pap    BACK SURGERY     cervical spine [place] [facility]   CESAREAN SECTION     x2    COLONOSCOPY WITH PROPOFOL N/A 04/11/2018   Procedure: COLONOSCOPY WITH PROPOFOL;  Surgeon: Wyline Mood, MD;  Location: [facility] ENDOSCOPY;  Service: Gastroenterology;  Laterality: N/A;   COLONOSCOPY WITH PROPOFOL N/A 06/20/2018   Procedure: COLONOSCOPY WITH PROPOFOL;  Surgeon: Wyline Mood, MD;  Location: [facility] ENDOSCOPY;  Service: Gastroenterology;  Laterality: N/A;   COLONOSCOPY WITH PROPOFOL N/A 09/22/2021   Procedure: COLONOSCOPY WITH PROPOFOL;  Surgeon: Wyline Mood, MD;  Location: [facility] ENDOSCOPY;  Service: Gastroenterology;  Laterality: N/A;   Patient Active Problem List   Diagnosis Date Noted   Breast cancer screening by mammogram 05/04/2023   Class 2 obesity 05/04/2023   Premature atrial contraction 02/28/2022   Hypokalemia 02/28/2022   Pruritus 11/03/2020   Hives 11/03/2020   Annual physical exam 08/02/2020   TMJ (temporomandibular joint disorder) 08/02/2020   Abnormal MRI, lumbar spine 08/02/2020   Lumbar radiculopathy 07/24/2020   Obesity (BMI 30-39.9) 07/18/2020   Left sided sciatica 07/18/2020   Prediabetes 07/18/2020   Osteoporosis 02/28/2018   Vitamin D deficiency  11/05/2017   Environmental and seasonal allergies 03/29/2017   Dermatitis 07/12/2016   Acute low back pain without sciatica 05/29/2016   Numbness and tingling in right hand 05/29/2016   Cardiac murmur 10/31/2015   Hyperlipidemia 06/14/2015   Essential hypertension 01/29/2011   ABNORMAL ELECTROCARDIOGRAM 01/29/2011    PCP: pt transferring providers.    REFERRING PROVIDER: Tressie Stalker, MD  REFERRING DIAG: ICD-10-CM Intervertebral disc disorders with radiculopathy, lumbar region   Rationale for Evaluation and Treatment: Rehabilitation  THERAPY DIAG:  Muscle weakness (generalized)  Difficulty in walking, not elsewhere classified  Low back pain radiating down leg  ONSET DATE: 11/30/2022  SUBJECTIVE:  SUBJECTIVE STATEMENT: Patient reports having a rough weekend- increased pain overall  PERTINENT HISTORY:  62 year old black female nonsmoker on whom hada C6-7 anterior cervical diskectomy fusion plating on 02/05/2011. She did well after that surgery and have not seen her in years.The patient returns today with a new problem. She tells me she began having pain in her right leg all on 11/30/2022 without notable precipitating events. The patient was seen at [facility] by several doctors. She had a lumbar MRI. She then had 2 lumbar epidural injections which helped " a few days" . When her pain recurred she was told she had a pinched nerve. She scheduled appointment for my opinion.During today's visit the patient is accompanied by her husband and relates the above information. She complains of pain mainly in her right leg and what sounds like the L4 distribution. She says her right leg/knee gives out. Her symptoms are present more less all the time but worse at night. Her symptoms do not significantly  worsened with standing walking. She does not have any radicular symptoms on the left. She has been treated with various medications including a prednisone taper, gabapentin, oxycodone, muscle relaxants, etcetera. She has not had any physical therapy or chiropractic care. She tells me her neck is doing well.    PAIN:  Are you having pain? Yes: NPRS scale: 7/10 Pain location: lateral R leg  Pain description: nagging dull  ach. Can be nburning or tingling.  Aggravating factors: going up/down stairs Relieving factors: none  PRECAUTIONS: None  WEIGHT BEARING RESTRICTIONS: No  FALLS:  Has patient fallen in last 6 months? 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   PLOF: Independent and Independent with basic ADLs  PATIENT GOALS: relief in back pain   NEXT MD VISIT: July 2024   OBJECTIVE:   DIAGNOSTIC FINDINGS:  MRI - R lateral disc bulge at L4-L5  PATIENT SURVEYS:  FOTO 58   SCREENING FOR RED FLAGS: Bowel or bladder incontinence: No Spinal tumors: No Cauda equina syndrome: No Compression fracture: No Abdominal aneurysm: No  COGNITION: Overall cognitive status: Within functional limits for tasks assessed     SENSATION: WFL  MUSCLE LENGTH: Hamstrings: Right decreased lacking 30 deg full extension at knee deg; Left WFL lacking ~15 deg full extension   POSTURE: rounded shoulders, forward head, and weight shift right  PALPATION: Multiple trigger point in R paraspinals and QL. Trigger points found in Piriformis and Glute med/max. Hypermobile in vertebrae T10-L5.   LUMBAR ROM:   AROM eval  Flexion   Extension   Right lateral flexion   Left lateral flexion   Right rotation   Left rotation    (Blank rows = not tested)  LOWER EXTREMITY  ROM:     Active  Right eval Left eval  Hip flexion Lacking ~ 15 deg  WFL  Hip extension Unable to lift beyond neutral in prone [facility]  Hip abduction    Hip adduction    Hip internal rotation    Hip external rotation    Knee flexion    Knee extension    Ankle dorsiflexion    Ankle plantarflexion    Ankle inversion    Ankle eversion     (Blank rows = not tested)  LOWER EXTREMITY  MMT:    MMT Right eval Left eval  Hip flexion 4- 4+  Hip extension 3 4-  Hip abduction 4- 5  Hip adduction 4 4+  Hip internal rotation    Hip external rotation    Knee flexion 4 5  Knee extension 4 5  Ankle dorsiflexion 4 5  Ankle plantarflexion 3+ 4+  Ankle inversion    Ankle eversion     (Blank rows = not tested)  LUMBAR SPECIAL TESTS:  Prone instability test: Positive, Straight leg raise test: Positive, Single leg stance test: TBD, SI Compression/distraction test: Negative, and FABER test: Positive  FUNCTIONAL TESTS:  5 times sit to stand: 13.22 with UE support 15.44 with no UE support  Timed up and go (TUG): 10.3  6 minute walk test: 1217ft. Mild increase in pain on the RLE 10 meter walk test: 10.9sec  Functional gait assessment: to be completed on next visit  GAIT: Distance walked: 40 Assistive device utilized: None Level of assistance: Complete Independence Comments: noted lateral cervical and trunk flexion to the R with decreased pelvic rotation Bil.   TODAY'S TREATMENT:                                                                                                                              DATE: 05/06/2023   Manual; Hamstring lengthening stretch 30 seconds x 3 each LE (increased tightness right vs. Left)  Single knee to chest 30 seconds x 2 each LE Figure four stretch 30 seconds x 2 each LE  Hip flexor lengthening [place]e: 3x30 seconds RLE Rolling stick to R quad x 4 minutes Long axis distraction- hold 30 sec on right LE  TherEx: Sciatic nerve glide 20x each side Lumbar  trunk rotation 10x ; 2 sets Posterior pelvic tilt 10x  with VC and video demo for technique- added to HEP Open prayer stretch 3x30 seconds Supine TrA activation 10x with VC           PATIENT EDUCATION:  Education details: POC. Rehab potential. Pt educated throughout session about proper posture and technique with exercises. Improved exercise technique, movement at target joints, use of target muscles after min to mod verbal, visual, tactile cues.  Person educated: Patient Education method: Explanation, Demonstration, and Handouts Education comprehension: verbalized understanding and needs further education  HOME EXERCISE PROGRAM:  Access Code: [email or web address] URL: [email or web address] Date: 05/06/2023 Prepared by: Maureen Ralphs  Exercises - Supine Posterior Pelvic Tilt  - 1 x daily - 7 x weekly - 3 sets - 10 reps - Clamshell  - 1 x daily - 7 x weekly - 3 sets - 10 reps     Access Code: [email or web address] URL: [email or web address] Date: 04/16/2023 Prepared by: Grier Rocher  Exercises - Supine Bridge  - 1 x daily - 7 x weekly - 3 sets - 10 reps - 2 hold - Supine Lower Trunk Rotation  - 1 x daily - 7 x weekly - 3 sets - 10  reps - 3 hold - Seated Piriformis Stretch with Trunk Bend  - 1 x daily - 7 x weekly - 3 sets - 5 reps - 15 hold  ASSESSMENT:  CLINICAL IMPRESSION: Patient presents with no significant change in RLE symptoms but demonstrates some difficulty with core activation- TrA and glutes. Added clamshell and posterior pelvic tilt to HEP. Continued tenderness along lateral hip- posterior to greater trochanter. Patient will benefit from Skilled intervention to reduce pain, improve strength,  to allow improved function to return to PLOF and improved overall QoL.    OBJECTIVE IMPAIRMENTS: Abnormal gait, decreased activity tolerance, decreased balance, decreased endurance, decreased knowledge of condition, decreased mobility, difficulty walking,  decreased strength, increased fascial restrictions, impaired perceived functional ability, improper body mechanics, and postural dysfunction.   ACTIVITY LIMITATIONS: carrying, lifting, standing, squatting, stairs, bed mobility, and locomotion level  PARTICIPATION LIMITATIONS: laundry, community activity, and occupation  PERSONAL FACTORS: Age, Fitness, and Past/current experiences are also affecting patient's functional outcome.   REHAB POTENTIAL: Good  CLINICAL DECISION MAKING: Stable/uncomplicated  EVALUATION COMPLEXITY: Moderate   GOALS: Goals reviewed with patient? Yes   SHORT TERM GOALS: Target date: 05/28/2023    Patient will be independent in home exercise program to improve strength/mobility for better functional independence with ADLs. Baseline: Goal status: INITIAL   LONG TERM GOALS: Target date: 07/09/2023    Patient will increase FOTO score to equal to or greater than 68   to demonstrate statistically significant improvement in mobility and quality of life.  Baseline:  Goal status: INITIAL  2.  Patient (> 3 years old) will complete five times sit to stand test in < 10 seconds indicating an increased LE strength and improved balance. Baseline: 15.4sec Goal status: INITIAL  3.  Patient will increase 6 min walk test  to >1500 ft points to demonstrate decreased fall risk during functional activities Baseline: 1250ft. Mild increase in pain on the RLE Goal status: INITIAL  4.  Patient will increase 10 meter walk test to >1.67m/s as to improve gait speed for better community ambulation and to reduce fall risk. Baseline: 0.10m/s Goal status: INITIAL  5.  Patient will increase FGA >20 as to demonstrate reduced fall risk and improved dynamic gait balance for better safety with community/home ambulation.  Baseline: to be completed 4/18: 17/30  Goal status: INITIAL  6.  Patient will reduce pain to 0-1/10 with functional activities throughout day to demonstrate improved  function and reduce impairment  Baseline:5/10  Goal status: INITIAL   PLAN:  PT FREQUENCY: 1-2x/week  PT DURATION: 12 weeks  PLANNED INTERVENTIONS: Therapeutic exercises, Therapeutic activity, Neuromuscular re-education, Balance training, Gait training, Patient/Family education, Self Care, Joint mobilization, Joint manipulation, Stair training, Dry Needling, Electrical stimulation, Spinal manipulation, Spinal mobilization, Moist heat, Taping, Traction, and Manual therapy.  PLAN FOR NEXT SESSION:   Continue with core stabilization exercises, manual therapy Pain modulation for R sided radicular pain, RLE strengthening   Lenda Kelp PT  Physical Therapist - [facility]  8:08 AM 05/06/23

## 2023-05-08 NOTE — Therapy (Signed)
OUTPATIENT PHYSICAL THERAPY THORACOLUMBAR TREATMENT   Patient Name: Joyce Simpson MRN: [id number] DOB:1961-12-31, 62 y.o., female Today's Date: 05/09/2023  END OF SESSION:  PT End of Session - 05/09/23 0759     Visit Number 8    Number of Visits 24    Date for PT Re-Evaluation 07/09/23    PT Start Time 0800    PT Stop Time 0844    PT Time Calculation (min) 44 min    Activity Tolerance Patient tolerated treatment well    Behavior During Therapy [facility] for tasks assessed/performed                    Past Medical History:  Diagnosis Date   COVID-19    12/19/19   Diverticulosis    Heart murmur    Hyperlipidemia    Hypertension    Migraines    Osteoporosis    Past Surgical History:  Procedure Laterality Date   ABDOMINAL HYSTERECTOMY     dub 2008/2009 h/o abnormal pap    BACK SURGERY     cervical spine [place]   CESAREAN SECTION     x2    COLONOSCOPY WITH PROPOFOL N/A 04/11/2018   Procedure: COLONOSCOPY WITH PROPOFOL;  Surgeon: Wyline Mood, MD;  Location: [facility] ENDOSCOPY;  Service: Gastroenterology;  Laterality: N/A;   COLONOSCOPY WITH PROPOFOL N/A 06/20/2018   Procedure: COLONOSCOPY WITH PROPOFOL;  Surgeon: Wyline Mood, MD;  Location: [facility] ENDOSCOPY;  Service: Gastroenterology;  Laterality: N/A;   COLONOSCOPY WITH PROPOFOL N/A 09/22/2021   Procedure: COLONOSCOPY WITH PROPOFOL;  Surgeon: Wyline Mood, MD;  Location: [facility] ENDOSCOPY;  Service: Gastroenterology;  Laterality: N/A;   Patient Active Problem List   Diagnosis Date Noted   Breast cancer screening by mammogram 05/04/2023   Class 2 obesity 05/04/2023   Premature atrial contraction 02/28/2022   Hypokalemia 02/28/2022   Pruritus 11/03/2020   Hives 11/03/2020   Annual physical exam 08/02/2020   TMJ (temporomandibular joint disorder) 08/02/2020   Abnormal MRI, lumbar spine 08/02/2020   Lumbar radiculopathy 07/24/2020   Obesity (BMI 30-39.9) 07/18/2020   Left sided sciatica 07/18/2020   Prediabetes  07/18/2020   Osteoporosis 02/28/2018   Vitamin D deficiency 11/05/2017   Environmental and seasonal allergies 03/29/2017   Dermatitis 07/12/2016   Acute low back pain without sciatica 05/29/2016   Numbness and tingling in right hand 05/29/2016   Cardiac murmur 10/31/2015   Hyperlipidemia 06/14/2015   Essential hypertension 01/29/2011   ABNORMAL ELECTROCARDIOGRAM 01/29/2011    PCP: pt transferring providers.    REFERRING PROVIDER: Tressie Stalker, MD  REFERRING DIAG: ICD-10-CM Intervertebral disc disorders with radiculopathy, lumbar region   Rationale for Evaluation and Treatment: Rehabilitation  THERAPY DIAG:  Muscle weakness (generalized)  Difficulty in walking, not elsewhere classified  Low back pain radiating down leg  ONSET DATE: 11/30/2022  SUBJECTIVE:  SUBJECTIVE STATEMENT: Patient reports her pain is a 6/10.   PERTINENT HISTORY:  62 year old black female nonsmoker on whom hada C6-7 anterior cervical diskectomy fusion plating on 02/05/2011. She did well after that surgery and have not seen her in years.The patient returns today with a new problem. She tells me she began having pain in her right leg all on 11/30/2022 without notable precipitating events. The patient was seen at [facility] by several doctors. She had a lumbar MRI. She then had 2 lumbar epidural injections which helped " a few days" . When her pain recurred she was told she had a pinched nerve. She scheduled appointment for my opinion.During today's visit the patient is accompanied by her husband and relates the above information. She complains of pain mainly in her right leg and what sounds like the L4 distribution. She says her right leg/knee gives out. Her symptoms are present more less all the time but worse at night. Her  symptoms do not significantly worsened with standing walking. She does not have any radicular symptoms on the left. She has been treated with various medications including a prednisone taper, gabapentin, oxycodone, muscle relaxants, etcetera. She has not had any physical therapy or chiropractic care. She tells me her neck is doing well.    PAIN:  Are you having pain? Yes: NPRS scale: 7/10 Pain location: lateral R leg  Pain description: nagging dull  ach. Can be nburning or tingling.  Aggravating factors: going up/down stairs Relieving factors: none  PRECAUTIONS: None  WEIGHT BEARING RESTRICTIONS: No  FALLS:  Has patient fallen in last 6 months? 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   PLOF: Independent and Independent with basic ADLs  PATIENT GOALS: relief in back pain   NEXT MD VISIT: July 2024   OBJECTIVE:   DIAGNOSTIC FINDINGS:  MRI - R lateral disc bulge at L4-L5  PATIENT SURVEYS:  FOTO 58   SCREENING FOR RED FLAGS: Bowel or bladder incontinence: No Spinal tumors: No Cauda equina syndrome: No Compression fracture: No Abdominal aneurysm: No  COGNITION: Overall cognitive status: Within functional limits for tasks assessed     SENSATION: WFL  MUSCLE LENGTH: Hamstrings: Right decreased lacking 30 deg full extension at knee deg; Left WFL lacking ~15 deg full extension   POSTURE: rounded shoulders, forward head, and weight shift right  PALPATION: Multiple trigger point in R paraspinals and QL. Trigger points found in Piriformis and Glute med/max. Hypermobile in vertebrae T10-L5.   LUMBAR ROM:   AROM eval  Flexion   Extension   Right lateral flexion   Left lateral flexion   Right rotation   Left rotation    (Blank rows =  not tested)  LOWER EXTREMITY ROM:     Active  Right eval Left eval  Hip flexion Lacking ~ 15 deg  WFL  Hip extension Unable to lift beyond neutral in prone [facility]  Hip abduction    Hip adduction    Hip internal rotation    Hip external rotation    Knee flexion    Knee extension    Ankle dorsiflexion    Ankle plantarflexion    Ankle inversion    Ankle eversion     (Blank rows = not tested)  LOWER EXTREMITY MMT:  MMT Right eval Left eval  Hip flexion 4- 4+  Hip extension 3 4-  Hip abduction 4- 5  Hip adduction 4 4+  Hip internal rotation    Hip external rotation    Knee flexion 4 5  Knee extension 4 5  Ankle dorsiflexion 4 5  Ankle plantarflexion 3+ 4+  Ankle inversion    Ankle eversion     (Blank rows = not tested)  LUMBAR SPECIAL TESTS:  Prone instability test: Positive, Straight leg raise test: Positive, Single leg stance test: TBD, SI Compression/distraction test: Negative, and FABER test: Positive  FUNCTIONAL TESTS:  5 times sit to stand: 13.22 with UE support 15.44 with no UE support  Timed up and go (TUG): 10.3  6 minute walk test: 1239ft. Mild increase in pain on the RLE 10 meter walk test: 10.9sec  Functional gait assessment: to be completed on next visit  GAIT: Distance walked: 40 Assistive device utilized: None Level of assistance: Complete Independence Comments: noted lateral cervical and trunk flexion to the R with decreased pelvic rotation Bil.   TODAY'S TREATMENT:                                                                                                                              DATE: 05/09/2023      Manual; Hamstring lengthening stretch 30 seconds each LE Single knee to chest 60 seconds each LE Figure four stretch 30 seconds each LE  STM to lumbar paraspinals and quadratus lumborum R sided x 8 minutes Hip flexor lengthening prone: 3x30 seconds  Rolling stick to R quad x 4 minutes   TherEx: Sciatic nerve glide 20x each side LE  rotation 10x ; 2 sets Posterior pelvic tilt 10x  with cues for coordination Open prayer stretch 3x30 seconds Standing TrA activation 10x pressing into swiss ball  Seated large swiss bal stretch 10x 10 second holds  Adduction ball squeeze 10x  Seated heel raise with adduction ball squeeze 15x   LAQ 10x each LE         PATIENT EDUCATION:  Education details: POC. Rehab potential. Pt educated throughout session about proper posture and technique with exercises. Improved exercise technique, movement at target joints, use of target muscles after min to mod verbal, visual, tactile cues.  Person educated: Patient Education method: Explanation, Demonstration, and Handouts Education comprehension: verbalized understanding and needs further education  HOME EXERCISE PROGRAM:  Access Code: [email or web address] URL: [email or web address] Date: 05/06/2023 Prepared by: Maureen Ralphs  Exercises - Supine Posterior Pelvic Tilt  - 1 x daily - 7 x weekly - 3 sets - 10 reps - Clamshell  - 1 x daily - 7 x weekly - 3 sets - 10 reps     Access Code: [email or web address] URL: [email or web address] Date: 04/16/2023 Prepared by: Grier Rocher  Exercises - Supine Bridge  - 1 x daily - 7 x weekly - 3 sets - 10 reps - 2 hold - Supine Lower Trunk Rotation  -  1 x daily - 7 x weekly - 3 sets - 10 reps - 3 hold - Seated Piriformis Stretch with Trunk Bend  - 1 x daily - 7 x weekly - 3 sets - 5 reps - 15 hold  ASSESSMENT:  CLINICAL IMPRESSION: Patient has anterior R thigh pain that is assisted with prolonged stretch and roller. She is highly motivated throughout session and tolerates increased seated exercises this session. Patient is eager to progress her pain free mobility.  Patient tolerates LE strengthening with minimal back pain increase. Patient will benefit from Skilled intervention to reduce pain, improve strength,  to allow improved function to return to PLOF and improved overall QoL.     OBJECTIVE IMPAIRMENTS: Abnormal gait, decreased activity tolerance, decreased balance, decreased endurance, decreased knowledge of condition, decreased mobility, difficulty walking, decreased strength, increased fascial restrictions, impaired perceived functional ability, improper body mechanics, and postural dysfunction.   ACTIVITY LIMITATIONS: carrying, lifting, standing, squatting, stairs, bed mobility, and locomotion level  PARTICIPATION LIMITATIONS: laundry, community activity, and occupation  PERSONAL FACTORS: Age, Fitness, and Past/current experiences are also affecting patient's functional outcome.   REHAB POTENTIAL: Good  CLINICAL DECISION MAKING: Stable/uncomplicated  EVALUATION COMPLEXITY: Moderate   GOALS: Goals reviewed with patient? Yes   SHORT TERM GOALS: Target date: 05/28/2023    Patient will be independent in home exercise program to improve strength/mobility for better functional independence with ADLs. Baseline: Goal status: INITIAL   LONG TERM GOALS: Target date: 07/09/2023    Patient will increase FOTO score to equal to or greater than 68   to demonstrate statistically significant improvement in mobility and quality of life.  Baseline:  Goal status: INITIAL  2.  Patient (> 43 years old) will complete five times sit to stand test in < 10 seconds indicating an increased LE strength and improved balance. Baseline: 15.4sec Goal status: INITIAL  3.  Patient will increase 6 min walk test  to >1500 ft points to demonstrate decreased fall risk during functional activities Baseline: 1290ft. Mild increase in pain on the RLE Goal status: INITIAL  4.  Patient will increase 10 meter walk test to >1.14m/s as to improve gait speed for better community ambulation and to reduce fall risk. Baseline: 0.2m/s Goal status: INITIAL  5.  Patient will increase FGA >20 as to demonstrate reduced fall risk and improved dynamic gait balance for better safety with  community/home ambulation.  Baseline: to be completed 4/18: 17/30  Goal status: INITIAL  6.  Patient will reduce pain to 0-1/10 with functional activities throughout day to demonstrate improved function and reduce impairment  Baseline:5/10  Goal status: INITIAL   PLAN:  PT FREQUENCY: 1-2x/week  PT DURATION: 12 weeks  PLANNED INTERVENTIONS: Therapeutic exercises, Therapeutic activity, Neuromuscular re-education, Balance training, Gait training, Patient/Family education, Self Care, Joint mobilization, Joint manipulation, Stair training, Dry Needling, Electrical stimulation, Spinal manipulation, Spinal mobilization, Moist heat, Taping, Traction, and Manual therapy.  PLAN FOR NEXT SESSION:   Continue with core stabilization exercises, manual therapy Pain modulation for R sided radicular pain, RLE strengthening   Precious Bard PT  Physical Therapist - [facility]  [facility]  8:57 AM 05/09/23

## 2023-05-08 NOTE — Telephone Encounter (Signed)
It was up to the patient if she wanted to continue since her last one was not paid by her insurance. I will submit info to [employer] portal for benefit verification & see if PA is required. Once I hear back, I'll contact patient

## 2023-05-09 ENCOUNTER — Ambulatory Visit: Payer: Commercial Managed Care - PPO

## 2023-05-09 ENCOUNTER — Encounter: Payer: 59 | Admitting: Family Medicine

## 2023-05-09 DIAGNOSIS — M6281 Muscle weakness (generalized): Secondary | ICD-10-CM | POA: Diagnosis not present

## 2023-05-09 DIAGNOSIS — M545 Low back pain, unspecified: Secondary | ICD-10-CM | POA: Diagnosis not present

## 2023-05-09 DIAGNOSIS — R262 Difficulty in walking, not elsewhere classified: Secondary | ICD-10-CM | POA: Diagnosis not present

## 2023-05-09 DIAGNOSIS — M79606 Pain in leg, unspecified: Secondary | ICD-10-CM | POA: Diagnosis not present

## 2023-05-14 ENCOUNTER — Ambulatory Visit: Payer: Commercial Managed Care - PPO

## 2023-05-14 DIAGNOSIS — M545 Low back pain, unspecified: Secondary | ICD-10-CM | POA: Diagnosis not present

## 2023-05-14 DIAGNOSIS — M6281 Muscle weakness (generalized): Secondary | ICD-10-CM

## 2023-05-14 DIAGNOSIS — M79606 Pain in leg, unspecified: Secondary | ICD-10-CM | POA: Diagnosis not present

## 2023-05-14 DIAGNOSIS — R262 Difficulty in walking, not elsewhere classified: Secondary | ICD-10-CM | POA: Diagnosis not present

## 2023-05-14 NOTE — Therapy (Signed)
OUTPATIENT PHYSICAL THERAPY THORACOLUMBAR TREATMENT   Patient Name: Joyce Simpson MRN: [id number] DOB:1961/01/26, 62 y.o., female Today's Date: 05/14/2023  END OF SESSION:  PT End of Session - 05/14/23 0806     Visit Number 9    Number of Visits 24    Date for PT Re-Evaluation 07/09/23    PT Start Time 0803    PT Stop Time 0846    PT Time Calculation (min) 43 min    Activity Tolerance Patient tolerated treatment well    Behavior During Therapy [facility] for tasks assessed/performed                     Past Medical History:  Diagnosis Date   COVID-19    12/19/19   Diverticulosis    Heart murmur    Hyperlipidemia    Hypertension    Migraines    Osteoporosis    Past Surgical History:  Procedure Laterality Date   ABDOMINAL HYSTERECTOMY     dub 2008/2009 h/o abnormal pap    BACK SURGERY     cervical spine [place] NS   CESAREAN SECTION     x2    COLONOSCOPY WITH PROPOFOL N/A 04/11/2018   Procedure: COLONOSCOPY WITH PROPOFOL;  Surgeon: Wyline Mood, MD;  Location: [facility] ENDOSCOPY;  Service: Gastroenterology;  Laterality: N/A;   COLONOSCOPY WITH PROPOFOL N/A 06/20/2018   Procedure: COLONOSCOPY WITH PROPOFOL;  Surgeon: Wyline Mood, MD;  Location: [facility] ENDOSCOPY;  Service: Gastroenterology;  Laterality: N/A;   COLONOSCOPY WITH PROPOFOL N/A 09/22/2021   Procedure: COLONOSCOPY WITH PROPOFOL;  Surgeon: Wyline Mood, MD;  Location: [facility] ENDOSCOPY;  Service: Gastroenterology;  Laterality: N/A;   Patient Active Problem List   Diagnosis Date Noted   Breast cancer screening by mammogram 05/04/2023   Class 2 obesity 05/04/2023   Premature atrial contraction 02/28/2022   Hypokalemia 02/28/2022   Pruritus 11/03/2020   Hives 11/03/2020   Annual physical exam 08/02/2020   TMJ (temporomandibular joint disorder) 08/02/2020   Abnormal MRI, lumbar spine 08/02/2020   Lumbar radiculopathy 07/24/2020   Obesity (BMI 30-39.9) 07/18/2020   Left sided sciatica 07/18/2020   Prediabetes  07/18/2020   Osteoporosis 02/28/2018   Vitamin D deficiency 11/05/2017   Environmental and seasonal allergies 03/29/2017   Dermatitis 07/12/2016   Acute low back pain without sciatica 05/29/2016   Numbness and tingling in right hand 05/29/2016   Cardiac murmur 10/31/2015   Hyperlipidemia 06/14/2015   Essential hypertension 01/29/2011   ABNORMAL ELECTROCARDIOGRAM 01/29/2011    PCP: pt transferring providers.    REFERRING PROVIDER: Tressie Stalker, MD  REFERRING DIAG: ICD-10-CM Intervertebral disc disorders with radiculopathy, lumbar region   Rationale for Evaluation and Treatment: Rehabilitation  THERAPY DIAG:  Muscle weakness (generalized)  Difficulty in walking, not elsewhere classified  Low back pain radiating down leg  ONSET DATE: 11/30/2022  SUBJECTIVE:  SUBJECTIVE STATEMENT: Patient reports her pain is a 6/10.   PERTINENT HISTORY:  62 year old black female nonsmoker on whom hada C6-7 anterior cervical diskectomy fusion plating on 02/05/2011. She did well after that surgery and have not seen her in years.The patient returns today with a new problem. She tells me she began having pain in her right leg all on 11/30/2022 without notable precipitating events. The patient was seen at [facility] by several doctors. She had a lumbar MRI. She then had 2 lumbar epidural injections which helped " a few days" . When her pain recurred she was told she had a pinched nerve. She scheduled appointment for my opinion.During today's visit the patient is accompanied by her husband and relates the above information. She complains of pain mainly in her right leg and what sounds like the L4 distribution. She says her right leg/knee gives out. Her symptoms are present more less all the time but worse at night. Her  symptoms do not significantly worsened with standing walking. She does not have any radicular symptoms on the left. She has been treated with various medications including a prednisone taper, gabapentin, oxycodone, muscle relaxants, etcetera. She has not had any physical therapy or chiropractic care. She tells me her neck is doing well.    PAIN:  Are you having pain? Yes: NPRS scale: 7/10 Pain location: lateral R leg  Pain description: nagging dull  ach. Can be nburning or tingling.  Aggravating factors: going up/down stairs Relieving factors: none  PRECAUTIONS: None  WEIGHT BEARING RESTRICTIONS: No  FALLS:  Has patient fallen in last 6 months? 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   PLOF: Independent and Independent with basic ADLs  PATIENT GOALS: relief in back pain   NEXT MD VISIT: July 2024   OBJECTIVE:   DIAGNOSTIC FINDINGS:  MRI - R lateral disc bulge at L4-L5  PATIENT SURVEYS:  FOTO 58   SCREENING FOR RED FLAGS: Bowel or bladder incontinence: No Spinal tumors: No Cauda equina syndrome: No Compression fracture: No Abdominal aneurysm: No  COGNITION: Overall cognitive status: Within functional limits for tasks assessed     SENSATION: WFL  MUSCLE LENGTH: Hamstrings: Right decreased lacking 30 deg full extension at knee deg; Left WFL lacking ~15 deg full extension   POSTURE: rounded shoulders, forward head, and weight shift right  PALPATION: Multiple trigger point in R paraspinals and QL. Trigger points found in Piriformis and Glute med/max. Hypermobile in vertebrae T10-L5.   LUMBAR ROM:   AROM eval  Flexion   Extension   Right lateral flexion   Left lateral flexion   Right rotation   Left rotation    (Blank rows =  not tested)  LOWER EXTREMITY ROM:     Active  Right eval Left eval  Hip flexion Lacking ~ 15 deg  WFL  Hip extension Unable to lift beyond neutral in prone [facility]  Hip abduction    Hip adduction    Hip internal rotation    Hip external rotation    Knee flexion    Knee extension    Ankle dorsiflexion    Ankle plantarflexion    Ankle inversion    Ankle eversion     (Blank rows = not tested)  LOWER EXTREMITY MMT:  MMT Right eval Left eval  Hip flexion 4- 4+  Hip extension 3 4-  Hip abduction 4- 5  Hip adduction 4 4+  Hip internal rotation    Hip external rotation    Knee flexion 4 5  Knee extension 4 5  Ankle dorsiflexion 4 5  Ankle plantarflexion 3+ 4+  Ankle inversion    Ankle eversion     (Blank rows = not tested)  LUMBAR SPECIAL TESTS:  Prone instability test: Positive, Straight leg raise test: Positive, Single leg stance test: TBD, SI Compression/distraction test: Negative, and FABER test: Positive  FUNCTIONAL TESTS:  5 times sit to stand: 13.22 with UE support 15.44 with no UE support  Timed up and go (TUG): 10.3  6 minute walk test: 1241ft. Mild increase in pain on the RLE 10 meter walk test: 10.9sec  Functional gait assessment: to be completed on next visit  GAIT: Distance walked: 40 Assistive device utilized: None Level of assistance: Complete Independence Comments: noted lateral cervical and trunk flexion to the R with decreased pelvic rotation Bil.   TODAY'S TREATMENT:                                                                                                                              DATE: 05/14/2023      Manual; Single knee to chest 30 seconds each LE x 3 Hamstring lengthening stretch 30 seconds x 3 each LE Piriformis stretch BLE x 30 sec x 3 each LE Figure four stretch 30 seconds x 3 each LE  STM to lumbar paraspinals and quadratus lumborum- R sided x 8 minutes Rolling stick to R TFL/IT band area x 4 minutes   TherEx: Sciatic nerve  glide 20x each side LE rotation 10x ; 2 sets Posterior pelvic tilt 10x  with cues for coordination Instruction in use of tennis ball for Self STM at wall in right lumbar section x 1 min Supine TrA activation 10x  with Dying bug (raising opp UE/LE)  Supine Tra contraction with isometric hip abd (hooklye with use of gait belt around distal thighs) - hold 5 sec x 10  Supine TrA contraction with hip Adduction rolled towel  squeeze 10x - 5 sec hold       PATIENT EDUCATION:  Education details: POC. Rehab potential. Pt educated throughout session about proper posture and technique with exercises. Improved exercise technique, movement at target joints, use of target muscles after min to mod verbal, visual, tactile cues.  Person educated: Patient Education method: Explanation, Demonstration, and Handouts Education comprehension: verbalized understanding and needs further education  HOME EXERCISE PROGRAM: Access Code: [email or web address] URL: [email or web address] Date: 05/14/2023 Prepared by: Maureen Ralphs  Exercises - Child's Pose with Sidebending  - 1 x daily - 7 x weekly - 3 sets - 20-30 hold - Standing Quadratus Lumborum Stretch with Doorway  - 1 x daily - 7 x weekly - 3 sets - 20-30 hold  Access Code: [email or web address] URL:  [email or web address] Date: 05/06/2023 Prepared by: Maureen Ralphs  Exercises - Supine Posterior Pelvic Tilt  - 1 x daily - 7 x weekly - 3 sets - 10 reps - Clamshell  - 1 x daily - 7 x weekly - 3 sets - 10 reps     Access Code: [email or web address] URL: [email or web address] Date: 04/16/2023 Prepared by: Grier Rocher  Exercises - Supine Bridge  - 1 x daily - 7 x weekly - 3 sets - 10 reps - 2 hold - Supine Lower Trunk Rotation  - 1 x daily - 7 x weekly - 3 sets - 10 reps - 3 hold - Seated Piriformis Stretch with Trunk Bend  - 1 x daily - 7 x weekly - 3 sets - 5 reps - 15 hold  ASSESSMENT:  CLINICAL IMPRESSION: Patient presents  with ongoing right sided low back pain yet continues to respond favorably with manual techniques including stretching and soft tissue mobilization stating feeling better after session. She remains tight with right sided lumbar paraspinal and QL region- added self massage with use of tennis ball at wall today and patient able to demo good understanding. She is able to progress some with core exercises without report of any increase radicular symptoms or low back pain.   Patient will benefit from Skilled intervention to reduce pain, improve strength,  to allow improved function to return to PLOF and improved overall QoL.    OBJECTIVE IMPAIRMENTS: Abnormal gait, decreased activity tolerance, decreased balance, decreased endurance, decreased knowledge of condition, decreased mobility, difficulty walking, decreased strength, increased fascial restrictions, impaired perceived functional ability, improper body mechanics, and postural dysfunction.   ACTIVITY LIMITATIONS: carrying, lifting, standing, squatting, stairs, bed mobility, and locomotion level  PARTICIPATION LIMITATIONS: laundry, community activity, and occupation  PERSONAL FACTORS: Age, Fitness, and Past/current experiences are also affecting patient's functional outcome.   REHAB POTENTIAL: Good  CLINICAL DECISION MAKING: Stable/uncomplicated  EVALUATION COMPLEXITY: Moderate   GOALS: Goals reviewed with patient? Yes   SHORT TERM GOALS: Target date: 05/28/2023    Patient will be independent in home exercise program to improve strength/mobility for better functional independence with ADLs. Baseline: Goal status: INITIAL   LONG TERM GOALS: Target date: 07/09/2023    Patient will increase FOTO score to equal to or greater than 68   to demonstrate statistically significant improvement in mobility and quality of life.  Baseline:  Goal status: INITIAL  2.  Patient (> 35 years old) will complete five times sit to stand test in < 10  seconds indicating an increased LE strength and improved balance. Baseline: 15.4sec Goal status: INITIAL  3.  Patient will increase 6 min walk test  to >1500 ft points to demonstrate decreased fall risk during functional activities Baseline: 12102ft. Mild increase in pain on the RLE Goal status: INITIAL  4.  Patient will increase 10 meter walk test to >1.45m/s as to improve gait speed for better community ambulation and to reduce fall risk. Baseline: 0.45m/s Goal status: INITIAL  5.  Patient will increase FGA >20 as to demonstrate reduced fall risk and improved dynamic gait balance for better safety with community/home ambulation.  Baseline: to be completed 4/18: 17/30  Goal status: INITIAL  6.  Patient will reduce pain to 0-1/10 with functional activities throughout day to demonstrate improved function and reduce impairment  Baseline:5/10  Goal status: INITIAL   PLAN:  PT FREQUENCY: 1-2x/week  PT DURATION: 12 weeks  PLANNED INTERVENTIONS: Therapeutic exercises, Therapeutic activity, Neuromuscular re-education, Balance training, Gait training, Patient/Family education,  Self Care, Joint mobilization, Joint manipulation, Stair training, Dry Needling, Electrical stimulation, Spinal manipulation, Spinal mobilization, Moist heat, Taping, Traction, and Manual therapy.  PLAN FOR NEXT SESSION:   Continue with core stabilization exercises, manual therapy Pain modulation for R sided radicular pain, RLE strengthening   Lenda Kelp PT  Physical Therapist - [facility]  [facility]  9:11 AM 05/14/23

## 2023-05-15 NOTE — Therapy (Signed)
OUTPATIENT PHYSICAL THERAPY THORACOLUMBAR TREATMENT/ Physical Therapy Progress Note   Dates of reporting period  04/16/23   to   05/16/23    Patient Name: Joyce Simpson MRN: [id number] DOB:January 26, 1961, 62 y.o., female Today's Date: 05/16/2023  END OF SESSION:  PT End of Session - 05/16/23 0758     Visit Number 10    Number of Visits 24    Date for PT Re-Evaluation 07/09/23    PT Start Time 0759    PT Stop Time 0844    PT Time Calculation (min) 45 min    Activity Tolerance Patient tolerated treatment well    Behavior During Therapy [facility] for tasks assessed/performed                      Past Medical History:  Diagnosis Date   COVID-19    12/19/19   Diverticulosis    Heart murmur    Hyperlipidemia    Hypertension    Migraines    Osteoporosis    Past Surgical History:  Procedure Laterality Date   ABDOMINAL HYSTERECTOMY     dub 2008/2009 h/o abnormal pap    BACK SURGERY     cervical spine [place] NS   CESAREAN SECTION     x2    COLONOSCOPY WITH PROPOFOL N/A 04/11/2018   Procedure: COLONOSCOPY WITH PROPOFOL;  Surgeon: Wyline Mood, MD;  Location: [facility] ENDOSCOPY;  Service: Gastroenterology;  Laterality: N/A;   COLONOSCOPY WITH PROPOFOL N/A 06/20/2018   Procedure: COLONOSCOPY WITH PROPOFOL;  Surgeon: Wyline Mood, MD;  Location: [facility] ENDOSCOPY;  Service: Gastroenterology;  Laterality: N/A;   COLONOSCOPY WITH PROPOFOL N/A 09/22/2021   Procedure: COLONOSCOPY WITH PROPOFOL;  Surgeon: Wyline Mood, MD;  Location: [facility] ENDOSCOPY;  Service: Gastroenterology;  Laterality: N/A;   Patient Active Problem List   Diagnosis Date Noted   Breast cancer screening by mammogram 05/04/2023   Class 2 obesity 05/04/2023   Premature atrial contraction 02/28/2022   Hypokalemia 02/28/2022   Pruritus 11/03/2020   Hives 11/03/2020   Annual physical exam 08/02/2020   TMJ (temporomandibular joint disorder) 08/02/2020   Abnormal MRI, lumbar spine 08/02/2020   Lumbar radiculopathy  07/24/2020   Obesity (BMI 30-39.9) 07/18/2020   Left sided sciatica 07/18/2020   Prediabetes 07/18/2020   Osteoporosis 02/28/2018   Vitamin D deficiency 11/05/2017   Environmental and seasonal allergies 03/29/2017   Dermatitis 07/12/2016   Acute low back pain without sciatica 05/29/2016   Numbness and tingling in right hand 05/29/2016   Cardiac murmur 10/31/2015   Hyperlipidemia 06/14/2015   Essential hypertension 01/29/2011   ABNORMAL ELECTROCARDIOGRAM 01/29/2011    PCP: pt transferring providers.    REFERRING PROVIDER: Tressie Stalker, MD  REFERRING DIAG: ICD-10-CM Intervertebral disc disorders with radiculopathy, lumbar region   Rationale for Evaluation and Treatment: Rehabilitation  THERAPY DIAG:  Muscle weakness (generalized)  Difficulty in walking, not elsewhere classified  Low back pain radiating down leg  ONSET DATE: 11/30/2022  SUBJECTIVE:  SUBJECTIVE STATEMENT: Patient reports having a good weekend.   PERTINENT HISTORY:  62 year old black female nonsmoker on whom hada C6-7 anterior cervical diskectomy fusion plating on 02/05/2011. She did well after that surgery and have not seen her in years.The patient returns today with a new problem. She tells me she began having pain in her right leg all on 11/30/2022 without notable precipitating events. The patient was seen at [facility] by several doctors. She had a lumbar MRI. She then had 2 lumbar epidural injections which helped " a few days" . When her pain recurred she was told she had a pinched nerve. She scheduled appointment for my opinion.During today's visit the patient is accompanied by her husband and relates the above information. She complains of pain mainly in her right leg and what sounds like the L4 distribution. She says her  right leg/knee gives out. Her symptoms are present more less all the time but worse at night. Her symptoms do not significantly worsened with standing walking. She does not have any radicular symptoms on the left. She has been treated with various medications including a prednisone taper, gabapentin, oxycodone, muscle relaxants, etcetera. She has not had any physical therapy or chiropractic care. She tells me her neck is doing well.    PAIN:  Are you having pain? Yes: NPRS scale: 7/10 Pain location: lateral R leg  Pain description: nagging dull  ach. Can be nburning or tingling.  Aggravating factors: going up/down stairs Relieving factors: none  PRECAUTIONS: None  WEIGHT BEARING RESTRICTIONS: No  FALLS:  Has patient fallen in last 6 months? 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   PLOF: Independent and Independent with basic ADLs  PATIENT GOALS: relief in back pain   NEXT MD VISIT: July 2024   OBJECTIVE:   DIAGNOSTIC FINDINGS:  MRI - R lateral disc bulge at L4-L5  PATIENT SURVEYS:  FOTO 58   SCREENING FOR RED FLAGS: Bowel or bladder incontinence: No Spinal tumors: No Cauda equina syndrome: No Compression fracture: No Abdominal aneurysm: No  COGNITION: Overall cognitive status: Within functional limits for tasks assessed     SENSATION: WFL  MUSCLE LENGTH: Hamstrings: Right decreased lacking 30 deg full extension at knee deg; Left WFL lacking ~15 deg full extension   POSTURE: rounded shoulders, forward head, and weight shift right  PALPATION: Multiple trigger point in R paraspinals and QL. Trigger points found in Piriformis and Glute med/max. Hypermobile in vertebrae T10-L5.   LUMBAR ROM:   AROM eval  Flexion   Extension    Right lateral flexion   Left lateral flexion   Right rotation   Left rotation    (Blank rows = not tested)  LOWER EXTREMITY ROM:     Active  Right eval Left eval  Hip flexion Lacking ~ 15 deg  WFL  Hip extension Unable to lift beyond neutral in prone [facility]  Hip abduction    Hip adduction    Hip internal rotation    Hip external rotation    Knee flexion    Knee extension    Ankle dorsiflexion    Ankle plantarflexion    Ankle inversion    Ankle eversion     (Blank rows = not tested)  LOWER EXTREMITY MMT:  MMT Right eval Left eval  Hip flexion 4- 4+  Hip extension 3 4-  Hip abduction 4- 5  Hip adduction 4 4+  Hip internal rotation    Hip external rotation    Knee flexion 4 5  Knee extension 4 5  Ankle dorsiflexion 4 5  Ankle plantarflexion 3+ 4+  Ankle inversion    Ankle eversion     (Blank rows = not tested)  LUMBAR SPECIAL TESTS:  Prone instability test: Positive, Straight leg raise test: Positive, Single leg stance test: TBD, SI Compression/distraction test: Negative, and FABER test: Positive  FUNCTIONAL TESTS:  5 times sit to stand: 13.22 with UE support 15.44 with no UE support  Timed up and go (TUG): 10.3  6 minute walk test: 1270ft. Mild increase in pain on the RLE 10 meter walk test: 10.9sec  Functional gait assessment: to be completed on next visit  GAIT: Distance walked: 40 Assistive device utilized: None Level of assistance: Complete Independence Comments: noted lateral cervical and trunk flexion to the R with decreased pelvic rotation Bil.   TODAY'S TREATMENT:                                                                                                                              DATE: 05/16/2023  Goals performed; see below      Manual; Single knee to chest 30 seconds each LE  Hamstring lengthening stretch 30 seconds Piriformis stretch BLE x 30 sec Figure four stretch 30 seconds  Rolling stick to R TFL/IT band area x 4 minutes Prone  hip flexor lengthening RLE 3x30 seconds    TherEx: Sciatic nerve glide 20x each side LE rotation 10x ; 2 sets Posterior pelvic tilt 10x  with cues for coordination Prone hamstring curl 10x each LE  Heel slides 10x each LE    PATIENT EDUCATION:  Education details: POC. Rehab potential. Pt educated throughout session about proper posture and technique with exercises. Improved exercise technique, movement at target joints, use of target muscles after min to mod verbal, visual, tactile cues.  Person educated: Patient Education method: Explanation, Demonstration, and Handouts Education comprehension: verbalized understanding and needs further education  HOME EXERCISE PROGRAM: Access Code: [email or web address] URL: [email or web address] Date: 05/14/2023 Prepared by: Maureen Ralphs  Exercises - Child's Pose with Sidebending  - 1 x daily - 7 x weekly - 3 sets - 20-30 hold - Standing Quadratus Lumborum Stretch with Doorway  - 1 x daily - 7 x weekly - 3 sets - 20-30 hold  Access Code: [email or web address] URL: [email or web address] Date: 05/06/2023 Prepared by: Maureen Ralphs  Exercises - Supine Posterior Pelvic Tilt  - 1 x daily - 7 x weekly - 3 sets - 10 reps - Clamshell  - 1 x daily - 7 x weekly - 3 sets - 10 reps     Access Code: [email or web address] URL: [email or web address] Date: 04/16/2023 Prepared by: Grier Rocher  Exercises - Supine Bridge  - 1 x daily -  7 x weekly - 3 sets - 10 reps - 2 hold - Supine Lower Trunk Rotation  - 1 x daily - 7 x weekly - 3 sets - 10 reps - 3 hold - Seated Piriformis Stretch with Trunk Bend  - 1 x daily - 7 x weekly - 3 sets - 5 reps - 15 hold  ASSESSMENT:  CLINICAL IMPRESSION: Patient has met her 5x STS and 10 MWT goal and made significant progress towards 6 minute walk test goal.  Patient's condition has the potential to improve in response to therapy. Maximum improvement is yet to be obtained. The anticipated  improvement is attainable and reasonable in a generally predictable time. Patient is more mobile and more stable but continues to have pain influencing her daily life.  Patient will benefit from Skilled intervention to reduce pain, improve strength,  to allow improved function to return to PLOF and improved overall QoL.    OBJECTIVE IMPAIRMENTS: Abnormal gait, decreased activity tolerance, decreased balance, decreased endurance, decreased knowledge of condition, decreased mobility, difficulty walking, decreased strength, increased fascial restrictions, impaired perceived functional ability, improper body mechanics, and postural dysfunction.   ACTIVITY LIMITATIONS: carrying, lifting, standing, squatting, stairs, bed mobility, and locomotion level  PARTICIPATION LIMITATIONS: laundry, community activity, and occupation  PERSONAL FACTORS: Age, Fitness, and Past/current experiences are also affecting patient's functional outcome.   REHAB POTENTIAL: Good  CLINICAL DECISION MAKING: Stable/uncomplicated  EVALUATION COMPLEXITY: Moderate   GOALS: Goals reviewed with patient? Yes   SHORT TERM GOALS: Target date: 05/28/2023    Patient will be independent in home exercise program to improve strength/mobility for better functional independence with ADLs. Baseline: 5/16: HEP compliant  Goal status: MET   LONG TERM GOALS: Target date: 07/09/2023    Patient will increase FOTO score to equal to or greater than 68   to demonstrate statistically significant improvement in mobility and quality of life.  Baseline: 54 Goal status: Ongoing   2.  Patient (> 76 years old) will complete five times sit to stand test in < 10 seconds indicating an increased LE strength and improved balance. Baseline: 15.4sec 5/16: 9.4 seconds Goal status: MET  3.  Patient will increase 6 min walk test  to >1500 ft points to demonstrate decreased fall risk during functional activities Baseline: 1232ft. Mild increase in pain  on the RLE 5/16: 1410  Goal status: IN PROGRESS  4.  Patient will increase 10 meter walk test to >1.42m/s as to improve gait speed for better community ambulation and to reduce fall risk. Baseline: 0.36m/s 5/1: 1.13 m/s  Goal status: MET  5.  Patient will increase FGA >26 as to demonstrate reduced fall risk and improved dynamic gait balance for better safety with community/home ambulation.  Baseline: to be completed 4/18: 17/30  5/16: 21 Goal status: IMET and progressed: New goal  6.  Patient will reduce pain to 0-1/10 with functional activities throughout day to demonstrate improved function and reduce impairment  Baseline:5/10 5/16: 5-6/10  Goal status: On going    PLAN:  PT FREQUENCY: 1-2x/week  PT DURATION: 12 weeks  PLANNED INTERVENTIONS: Therapeutic exercises, Therapeutic activity, Neuromuscular re-education, Balance training, Gait training, Patient/Family education, Self Care, Joint mobilization, Joint manipulation, Stair training, Dry Needling, Electrical stimulation, Spinal manipulation, Spinal mobilization, Moist heat, Taping, Traction, and Manual therapy.  PLAN FOR NEXT SESSION:   Continue with core stabilization exercises, manual therapy Pain modulation for R sided radicular pain, RLE strengthening   Precious Bard PT  Physical Therapist -   [facility]  8:45 AM 05/16/23

## 2023-05-16 ENCOUNTER — Ambulatory Visit: Payer: Commercial Managed Care - PPO

## 2023-05-16 DIAGNOSIS — R262 Difficulty in walking, not elsewhere classified: Secondary | ICD-10-CM | POA: Diagnosis not present

## 2023-05-16 DIAGNOSIS — M79606 Pain in leg, unspecified: Secondary | ICD-10-CM

## 2023-05-16 DIAGNOSIS — M545 Low back pain, unspecified: Secondary | ICD-10-CM | POA: Diagnosis not present

## 2023-05-16 DIAGNOSIS — M6281 Muscle weakness (generalized): Secondary | ICD-10-CM | POA: Diagnosis not present

## 2023-05-20 ENCOUNTER — Ambulatory Visit: Payer: Commercial Managed Care - PPO

## 2023-05-20 DIAGNOSIS — M79606 Pain in leg, unspecified: Secondary | ICD-10-CM | POA: Diagnosis not present

## 2023-05-20 DIAGNOSIS — M6281 Muscle weakness (generalized): Secondary | ICD-10-CM | POA: Diagnosis not present

## 2023-05-20 DIAGNOSIS — R262 Difficulty in walking, not elsewhere classified: Secondary | ICD-10-CM

## 2023-05-20 DIAGNOSIS — M545 Low back pain, unspecified: Secondary | ICD-10-CM

## 2023-05-20 NOTE — Therapy (Signed)
OUTPATIENT PHYSICAL THERAPY THORACOLUMBAR TREATMENT    Patient Name: Joyce Simpson MRN: [id number] DOB:Sep 13, 1961, 62 y.o., female Today's Date: 05/20/2023  END OF SESSION:  PT End of Session - 05/20/23 0809     Visit Number 11    Number of Visits 24    Date for PT Re-Evaluation 07/09/23    PT Start Time 0809    PT Stop Time 0841    PT Time Calculation (min) 32 min    Activity Tolerance Patient tolerated treatment well    Behavior During Therapy [facility] for tasks assessed/performed                       Past Medical History:  Diagnosis Date   COVID-19    12/19/19   Diverticulosis    Heart murmur    Hyperlipidemia    Hypertension    Migraines    Osteoporosis    Past Surgical History:  Procedure Laterality Date   ABDOMINAL HYSTERECTOMY     dub 2008/2009 h/o abnormal pap    BACK SURGERY     cervical spine [place]   CESAREAN SECTION     x2    COLONOSCOPY WITH PROPOFOL N/A 04/11/2018   Procedure: COLONOSCOPY WITH PROPOFOL;  Surgeon: Wyline Mood, MD;  Location: [facility] ENDOSCOPY;  Service: Gastroenterology;  Laterality: N/A;   COLONOSCOPY WITH PROPOFOL N/A 06/20/2018   Procedure: COLONOSCOPY WITH PROPOFOL;  Surgeon: Wyline Mood, MD;  Location: [facility] ENDOSCOPY;  Service: Gastroenterology;  Laterality: N/A;   COLONOSCOPY WITH PROPOFOL N/A 09/22/2021   Procedure: COLONOSCOPY WITH PROPOFOL;  Surgeon: Wyline Mood, MD;  Location: [facility] ENDOSCOPY;  Service: Gastroenterology;  Laterality: N/A;   Patient Active Problem List   Diagnosis Date Noted   Breast cancer screening by mammogram 05/04/2023   Class 2 obesity 05/04/2023   Premature atrial contraction 02/28/2022   Hypokalemia 02/28/2022   Pruritus 11/03/2020   Hives 11/03/2020   Annual physical exam 08/02/2020   TMJ (temporomandibular joint disorder) 08/02/2020   Abnormal MRI, lumbar spine 08/02/2020   Lumbar radiculopathy 07/24/2020   Obesity (BMI 30-39.9) 07/18/2020   Left sided sciatica 07/18/2020    Prediabetes 07/18/2020   Osteoporosis 02/28/2018   Vitamin D deficiency 11/05/2017   Environmental and seasonal allergies 03/29/2017   Dermatitis 07/12/2016   Acute low back pain without sciatica 05/29/2016   Numbness and tingling in right hand 05/29/2016   Cardiac murmur 10/31/2015   Hyperlipidemia 06/14/2015   Essential hypertension 01/29/2011   ABNORMAL ELECTROCARDIOGRAM 01/29/2011    PCP: pt transferring providers.    REFERRING PROVIDER: Tressie Stalker, MD  REFERRING DIAG: ICD-10-CM Intervertebral disc disorders with radiculopathy, lumbar region   Rationale for Evaluation and Treatment: Rehabilitation  THERAPY DIAG:  Muscle weakness (generalized)  Difficulty in walking, not elsewhere classified  Low back pain radiating down leg  ONSET DATE: 11/30/2022  SUBJECTIVE:  SUBJECTIVE STATEMENT:  Patient reports having a rough weekend with increased right LE pain. States didn't do anything special but just hurting.     PERTINENT HISTORY:  62 year old black female nonsmoker on whom had a C6-7 anterior cervical diskectomy fusion plating on 02/05/2011. She did well after that surgery and have not seen her in years.The patient returns today with a new problem. She tells me she began having pain in her right leg all on 11/30/2022 without notable precipitating events. The patient was seen at [facility] by several doctors. She had a lumbar MRI. She then had 2 lumbar epidural injections which helped " a few days" . When her pain recurred she was told she had a pinched nerve. She scheduled appointment for my opinion.During today's visit the patient is accompanied by her husband and relates the above information. She complains of pain mainly in her right leg and what sounds like the L4 distribution. She  says her right leg/knee gives out. Her symptoms are present more less all the time but worse at night. Her symptoms do not significantly worsened with standing walking. She does not have any radicular symptoms on the left. She has been treated with various medications including a prednisone taper, gabapentin, oxycodone, muscle relaxants, etcetera. She has not had any physical therapy or chiropractic care. She tells me her neck is doing well.    PAIN:  Are you having pain? Yes: NPRS scale: 7/10 Pain location: lateral R leg  Pain description: nagging dull  ach. Can be burning or tingling.  Aggravating factors: going up/down stairs Relieving factors: none  PRECAUTIONS: None  WEIGHT BEARING RESTRICTIONS: No  FALLS:  Has patient fallen in last 6 months? 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   PLOF: Independent and Independent with basic ADLs  PATIENT GOALS: relief in back pain   NEXT MD VISIT: July 2024   OBJECTIVE:   DIAGNOSTIC FINDINGS:  MRI - R lateral disc bulge at L4-L5  PATIENT SURVEYS:  FOTO 58   SCREENING FOR RED FLAGS: Bowel or bladder incontinence: No Spinal tumors: No Cauda equina syndrome: No Compression fracture: No Abdominal aneurysm: No  COGNITION: Overall cognitive status: Within functional limits for tasks assessed     SENSATION: WFL  MUSCLE LENGTH: Hamstrings: Right decreased lacking 30 deg full extension at knee deg; Left WFL lacking ~15 deg full extension   POSTURE: rounded shoulders, forward head, and weight shift right  PALPATION: Multiple trigger point in R paraspinals and QL. Trigger points found in Piriformis and Glute med/max. Hypermobile in vertebrae T10-L5.   LUMBAR ROM:   AROM eval  Flexion    Extension   Right lateral flexion   Left lateral flexion   Right rotation   Left rotation    (Blank rows = not tested)  LOWER EXTREMITY ROM:     Active  Right eval Left eval  Hip flexion Lacking ~ 15 deg  WFL  Hip extension Unable to lift beyond neutral in prone [facility]  Hip abduction    Hip adduction    Hip internal rotation    Hip external rotation    Knee flexion    Knee extension    Ankle dorsiflexion    Ankle plantarflexion    Ankle inversion  Ankle eversion     (Blank rows = not tested)  LOWER EXTREMITY MMT:    MMT Right eval Left eval  Hip flexion 4- 4+  Hip extension 3 4-  Hip abduction 4- 5  Hip adduction 4 4+  Hip internal rotation    Hip external rotation    Knee flexion 4 5  Knee extension 4 5  Ankle dorsiflexion 4 5  Ankle plantarflexion 3+ 4+  Ankle inversion    Ankle eversion     (Blank rows = not tested)  LUMBAR SPECIAL TESTS:  Prone instability test: Positive, Straight leg raise test: Positive, Single leg stance test: TBD, SI Compression/distraction test: Negative, and FABER test: Positive  FUNCTIONAL TESTS:  5 times sit to stand: 13.22 with UE support 15.44 with no UE support  Timed up and go (TUG): 10.3  6 minute walk test: 1249ft. Mild increase in pain on the RLE 10 meter walk test: 10.9sec  Functional gait assessment: to be completed on next visit  GAIT: Distance walked: 40 Assistive device utilized: None Level of assistance: Complete Independence Comments: noted lateral cervical and trunk flexion to the R with decreased pelvic rotation Bil.   TODAY'S TREATMENT:                                                                                                                              DATE: 05/20/2023     Manual; Single knee to chest 2 x 30 seconds each LE  Hamstring lengthening stretch 2 x 30 seconds each LE Piriformis stretch BLE  2 x 30 sec *patient noted cramping with left Hamstring stretching so terminated and switched to  self stretching below.    TherEx: Seated hamstring curl 10x each LE with GTB (no pain)  Heel slides 12 reps each LE Supine bridging x 12 reps  Standing hip ext with TrA contraction x 12 reps alt LE's Standing hip march with TrA bracing x 12 reps alt LE's.  Standing calf raises with TrA bracing x 12 reps alt LE's.  Patient instructed in the following self stretches in seated position: Seated piriformis - Hold 30 sec x 2 each LE Seated figure 4- Hold 30 sec x 2 each LE Seated hamstring stretch x 30 sec x 2 each LE  PATIENT EDUCATION:  Education details: POC. Rehab potential. Pt educated throughout session about proper posture and technique with exercises. Improved exercise technique, movement at target joints, use of target muscles after min to mod verbal, visual, tactile cues.  Person educated: Patient Education method: Explanation, Demonstration, and Handouts Education comprehension: verbalized understanding and needs further education  HOME EXERCISE PROGRAM: Access Code: [email or web address] URL: [email or web address] Date: 05/14/2023 Prepared by: Maureen Ralphs  Exercises - Child's Pose with Sidebending  - 1 x daily - 7 x weekly - 3 sets - 20-30 hold - Standing Quadratus Lumborum Stretch with Doorway  - 1 x daily - 7 x weekly - 3 sets - 20-30 hold  Access Code: [email or web address] URL: [email or web address] Date: 05/06/2023 Prepared by: Maureen Ralphs  Exercises - Supine Posterior Pelvic Tilt  - 1 x daily - 7 x weekly - 3 sets - 10 reps - Clamshell  - 1 x daily - 7 x weekly - 3 sets - 10 reps     Access Code: [email or web address] URL: [email or web address] Date: 04/16/2023 Prepared by: Grier Rocher  Exercises - Supine Bridge  - 1 x daily - 7 x weekly - 3 sets - 10 reps - 2 hold - Supine Lower Trunk Rotation  - 1 x daily - 7 x weekly - 3 sets - 10 reps - 3 hold - Seated Piriformis Stretch with Trunk Bend  - 1 x daily - 7 x weekly - 3 sets - 5 reps  - 15 hold  ASSESSMENT:  CLINICAL IMPRESSION: Patient presented a few min late for appointment due to forgot she was scheduled today but arrived with good motivation despite increased pain complaints. She was limited with manual stretching today- cramping but did much better with self stretching today. She was then able to progress to some LE strengthening with core activation without increased pain report. Patient will benefit from Skilled intervention to reduce pain, improve strength,  to allow improved function to return to PLOF and improved overall QoL.    OBJECTIVE IMPAIRMENTS: Abnormal gait, decreased activity tolerance, decreased balance, decreased endurance, decreased knowledge of condition, decreased mobility, difficulty walking, decreased strength, increased fascial restrictions, impaired perceived functional ability, improper body mechanics, and postural dysfunction.   ACTIVITY LIMITATIONS: carrying, lifting, standing, squatting, stairs, bed mobility, and locomotion level  PARTICIPATION LIMITATIONS: laundry, community activity, and occupation  PERSONAL FACTORS: Age, Fitness, and Past/current experiences are also affecting patient's functional outcome.   REHAB POTENTIAL: Good  CLINICAL DECISION MAKING: Stable/uncomplicated  EVALUATION COMPLEXITY: Moderate   GOALS: Goals reviewed with patient? Yes   SHORT TERM GOALS: Target date: 05/28/2023    Patient will be independent in home exercise program to improve strength/mobility for better functional independence with ADLs. Baseline: 5/16: HEP compliant  Goal status: MET   LONG TERM GOALS: Target date: 07/09/2023    Patient will increase FOTO score to equal to or greater than 68   to demonstrate statistically significant improvement in mobility and quality of life.  Baseline: 54 Goal status: Ongoing   2.  Patient (> 74 years old) will complete five times sit to stand test in < 10 seconds indicating an increased LE strength  and improved balance. Baseline: 15.4sec 5/16: 9.4 seconds Goal status: MET  3.  Patient will increase 6 min walk test  to >1500 ft points to demonstrate decreased fall risk during functional activities Baseline: 1275ft. Mild increase in pain on the RLE 5/16: 1410  Goal status: IN PROGRESS  4.  Patient will increase 10 meter walk test to >1.46m/s as to improve gait speed for better community ambulation and to reduce fall risk. Baseline: 0.82m/s 5/1: 1.13 m/s  Goal status: MET  5.  Patient will increase FGA >26 as to demonstrate reduced fall risk and improved dynamic gait balance for better safety with community/home ambulation.  Baseline: to be completed 4/18: 17/30  5/16: 21 Goal status: IMET and progressed: New goal  6.  Patient will reduce pain to 0-1/10 with functional activities throughout day to demonstrate improved function and reduce impairment  Baseline:5/10 5/16: 5-6/10  Goal status: On going    PLAN:  PT FREQUENCY: 1-2x/week  PT DURATION: 12 weeks  PLANNED INTERVENTIONS: Therapeutic exercises, Therapeutic activity,  Neuromuscular re-education, Balance training, Gait training, Patient/Family education, Self Care, Joint mobilization, Joint manipulation, Stair training, Dry Needling, Electrical stimulation, Spinal manipulation, Spinal mobilization, Moist heat, Taping, Traction, and Manual therapy.  PLAN FOR NEXT SESSION:   Continue with core stabilization exercises, manual therapy Pain modulation for R sided radicular pain, RLE strengthening   Lenda Kelp PT  Physical Therapist - [facility]  [facility]  8:56 AM 05/20/23

## 2023-05-22 ENCOUNTER — Ambulatory Visit
Admission: EM | Admit: 2023-05-22 | Discharge: 2023-05-22 | Disposition: A | Discharge status: Home / Self Care | Payer: Commercial Managed Care - PPO | Attending: Emergency Medicine | Admitting: Emergency Medicine

## 2023-05-22 ENCOUNTER — Other Ambulatory Visit: Payer: Self-pay

## 2023-05-22 DIAGNOSIS — K120 Recurrent oral aphthae: Secondary | ICD-10-CM

## 2023-05-22 DIAGNOSIS — B370 Candidal stomatitis: Secondary | ICD-10-CM

## 2023-05-22 MED ORDER — NYSTATIN 100000 UNIT/ML MT SUSP
500000.0000 [IU] | Freq: Four times a day (QID) | OROMUCOSAL | 0 refills | Status: AC
  Filled 2023-05-22: qty 200, 10d supply, fill #0

## 2023-05-22 NOTE — Discharge Instructions (Addendum)
Use the Nystatin oral suspension as directed.  Follow up with your primary care provider if your symptoms are not improving.    

## 2023-05-22 NOTE — ED Triage Notes (Signed)
Pt states she woke up yesterday with bad tongue pain on left side of tongue with slight swelling. Taking ibuprofen.

## 2023-05-22 NOTE — ED Provider Notes (Signed)
Renaldo Fiddler    CSN: [id number] Arrival date & time: 05/22/23  1324      History   Chief Complaint Chief Complaint  Patient presents with   Oral Swelling    HPI Joyce Simpson is a 62 y.o. female.  Patient presents with tongue pain x 1 day.  She reports it was swollen yesterday but this resolved.  Treating with ibuprofen.  No sore throat, difficulty swallowing, shortness of breath, rash, fever, or other symptoms.  Her medical history includes obesity, hypertension, hyperlipidemia.   The history is provided by the patient and medical records.    Past Medical History:  Diagnosis Date   COVID-19    12/19/19   Diverticulosis    Heart murmur    Hyperlipidemia    Hypertension    Migraines    Osteoporosis     Patient Active Problem List   Diagnosis Date Noted   Breast cancer screening by mammogram 05/04/2023   Class 2 obesity 05/04/2023   Premature atrial contraction 02/28/2022   Hypokalemia 02/28/2022   Pruritus 11/03/2020   Hives 11/03/2020   Annual physical exam 08/02/2020   TMJ (temporomandibular joint disorder) 08/02/2020   Abnormal MRI, lumbar spine 08/02/2020   Lumbar radiculopathy 07/24/2020   Obesity (BMI 30-39.9) 07/18/2020   Left sided sciatica 07/18/2020   Prediabetes 07/18/2020   Osteoporosis 02/28/2018   Vitamin D deficiency 11/05/2017   Environmental and seasonal allergies 03/29/2017   Dermatitis 07/12/2016   Acute low back pain without sciatica 05/29/2016   Numbness and tingling in right hand 05/29/2016   Cardiac murmur 10/31/2015   Hyperlipidemia 06/14/2015   Essential hypertension 01/29/2011   ABNORMAL ELECTROCARDIOGRAM 01/29/2011    Past Surgical History:  Procedure Laterality Date   ABDOMINAL HYSTERECTOMY     dub 2008/2009 h/o abnormal pap    BACK SURGERY     cervical spine [place]   CESAREAN SECTION     x2    COLONOSCOPY WITH PROPOFOL N/A 04/11/2018   Procedure: COLONOSCOPY WITH PROPOFOL;  Surgeon: Wyline Mood, MD;   Location: [facility] ENDOSCOPY;  Service: Gastroenterology;  Laterality: N/A;   COLONOSCOPY WITH PROPOFOL N/A 06/20/2018   Procedure: COLONOSCOPY WITH PROPOFOL;  Surgeon: Wyline Mood, MD;  Location: [facility] ENDOSCOPY;  Service: Gastroenterology;  Laterality: N/A;   COLONOSCOPY WITH PROPOFOL N/A 09/22/2021   Procedure: COLONOSCOPY WITH PROPOFOL;  Surgeon: Wyline Mood, MD;  Location: [facility] ENDOSCOPY;  Service: Gastroenterology;  Laterality: N/A;    OB History   No obstetric history on file.      Home Medications    Prior to Admission medications   Medication Sig Start Date End Date Taking? Authorizing Provider  amLODipine (NORVASC) 5 MG tablet Take 1 tablet (5 mg total) by mouth daily. 08/31/22  Yes McLean-Scocuzza, Pasty Spillers, MD  losartan-hydrochlorothiazide (HYZAAR) 50-12.5 MG tablet TAKE 1 TABLET BY MOUTH DAILY IN THE MORNING 08/31/22 08/31/23 Yes McLean-Scocuzza, Pasty Spillers, MD  nystatin (MYCOSTATIN) 100000 UNIT/ML suspension Take 5 mLs (500,000 Units total) by mouth 4 (four) times daily for 10 days. 05/22/23 06/01/23 Yes Mickie Bail, NP  Vitamin D, Ergocalciferol, (DRISDOL) 1.25 MG (50000 UNIT) CAPS capsule Take 1 capsule (50,000 Units total) by mouth every 7 (seven) days. 04/24/23  Yes Dana Allan, MD    Family History Family History  Problem Relation Age of Onset   Hypertension Mother    AAA (abdominal aortic aneurysm) Mother    Aneurysm Mother    Asthma Sister    Stroke Sister  Cancer Brother        Colon   Hypertension Brother    Cancer Brother        colon cancer    AAA (abdominal aortic aneurysm) Brother    Anuerysm Brother    Diabetes Daughter        type 1    Cancer Maternal Aunt        Breast   Breast cancer Maternal Aunt    Cancer Paternal Aunt        Breast   Breast cancer Paternal Aunt    Breast cancer Paternal Aunt    Cancer Other        breast, lung and colon in 3 different cousings    Social History Social History   Tobacco Use   Smoking status: Never   Smokeless  tobacco: Never  Vaping Use   Vaping Use: Never used  Substance Use Topics   Alcohol use: No   Drug use: No     Allergies   Alendronate sodium   Review of Systems Review of Systems  Constitutional:  Negative for chills and fever.  HENT:  Negative for ear pain, sore throat, trouble swallowing and voice change.        Tongue pain  Respiratory:  Negative for cough and shortness of breath.   Cardiovascular:  Negative for chest pain and palpitations.  All other systems reviewed and are negative.    Physical Exam Triage Vital Signs ED Triage Vitals  Enc Vitals Group     BP 05/22/23 1345 (!) 145/85     Pulse Rate 05/22/23 1331 84     Resp 05/22/23 1331 18     Temp 05/22/23 1331 98 F (36.7 C)     Temp src --      SpO2 05/22/23 1331 97 %     Weight --      Height --      Head Circumference --      Peak Flow --      Pain Score 05/22/23 1345 8     Pain Loc --      Pain Edu? --      Excl. in GC? --    No data found.  Updated Vital Signs BP (!) 145/85 (BP Location: Left Arm)   Pulse 84   Temp 98 F (36.7 C)   Resp 18   SpO2 97%   Visual Acuity Right Eye Distance:   Left Eye Distance:   Bilateral Distance:    Right Eye Near:   Left Eye Near:    Bilateral Near:     Physical Exam Vitals and nursing note reviewed.  Constitutional:      General: She is not in acute distress.    Appearance: She is well-developed. She is obese. She is not ill-appearing.  HENT:     Right Ear: Tympanic membrane normal.     Left Ear: Tympanic membrane normal.     Nose: Nose normal.     Mouth/Throat:     Mouth: Mucous membranes are moist.     Pharynx: Oropharynx is clear.      Comments: White coating on tongue. Cardiovascular:     Rate and Rhythm: Normal rate and regular rhythm.     Heart sounds: Normal heart sounds.  Pulmonary:     Effort: Pulmonary effort is normal. No respiratory distress.     Breath sounds: Normal breath sounds.  Musculoskeletal:     Cervical back: Neck  supple.  Skin:  General: Skin is warm and dry.  Neurological:     Mental Status: She is alert.  Psychiatric:        Mood and Affect: Mood normal.        Behavior: Behavior normal.      UC Treatments / Results  Labs (all labs ordered are listed, but only abnormal results are displayed) Labs Reviewed - No data to display  EKG   Radiology No results found.  Procedures Procedures (including critical care time)  Medications Ordered in UC Medications - No data to display  Initial Impression / Assessment and Plan / UC Course  I have reviewed the triage vital signs and the nursing notes.  Pertinent labs & imaging results that were available during my care of the patient were reviewed by me and considered in my medical decision making (see chart for details).    Tongue ulcer, oral thrush.  Discussed symptomatic treatment for tongue ulcer including OTC Orajel.  Treating oral thrush with nystatin oral suspension.  Education provided on mouth ulcers and oral thrush.  Instructed patient to follow up with her PCP if her symptoms are not improving.  She agrees to plan of care.    Final Clinical Impressions(s) / UC Diagnoses   Final diagnoses:  Aphthous ulcer of tongue  Thrush, oral     Discharge Instructions      Use the Nystatin oral suspension as directed.  Follow up with your primary care provider if your symptoms are not improving.        ED Prescriptions     Medication Sig Dispense Auth. Provider   nystatin (MYCOSTATIN) 100000 UNIT/ML suspension Take 5 mLs (500,000 Units total) by mouth 4 (four) times daily for 10 days. 200 mL Mickie Bail, NP      PDMP not reviewed this encounter.   Mickie Bail, NP 05/22/23 [phone]

## 2023-05-23 ENCOUNTER — Ambulatory Visit: Payer: Commercial Managed Care - PPO

## 2023-05-28 ENCOUNTER — Ambulatory Visit: Payer: Commercial Managed Care - PPO

## 2023-05-28 DIAGNOSIS — M6281 Muscle weakness (generalized): Secondary | ICD-10-CM | POA: Diagnosis not present

## 2023-05-28 DIAGNOSIS — M545 Low back pain, unspecified: Secondary | ICD-10-CM | POA: Diagnosis not present

## 2023-05-28 DIAGNOSIS — M79606 Pain in leg, unspecified: Secondary | ICD-10-CM | POA: Diagnosis not present

## 2023-05-28 DIAGNOSIS — R262 Difficulty in walking, not elsewhere classified: Secondary | ICD-10-CM

## 2023-05-28 NOTE — Therapy (Signed)
OUTPATIENT PHYSICAL THERAPY THORACOLUMBAR TREATMENT    Patient Name: Joyce Simpson MRN: [id number] DOB:11-13-1961, 62 y.o., female Today's Date: 05/22/2023  END OF SESSION:              Past Medical History:  Diagnosis Date   COVID-19    12/19/19   Diverticulosis    Heart murmur    Hyperlipidemia    Hypertension    Migraines    Osteoporosis    Past Surgical History:  Procedure Laterality Date   ABDOMINAL HYSTERECTOMY     dub 2008/2009 h/o abnormal pap    BACK SURGERY     cervical spine [place]   CESAREAN SECTION     x2    COLONOSCOPY WITH PROPOFOL N/A 04/11/2018   Procedure: COLONOSCOPY WITH PROPOFOL;  Surgeon: Wyline Mood, MD;  Location: [facility] ENDOSCOPY;  Service: Gastroenterology;  Laterality: N/A;   COLONOSCOPY WITH PROPOFOL N/A 06/20/2018   Procedure: COLONOSCOPY WITH PROPOFOL;  Surgeon: Wyline Mood, MD;  Location: [facility] ENDOSCOPY;  Service: Gastroenterology;  Laterality: N/A;   COLONOSCOPY WITH PROPOFOL N/A 09/22/2021   Procedure: COLONOSCOPY WITH PROPOFOL;  Surgeon: Wyline Mood, MD;  Location: [facility] ENDOSCOPY;  Service: Gastroenterology;  Laterality: N/A;   Patient Active Problem List   Diagnosis Date Noted   Breast cancer screening by mammogram 05/04/2023   Class 2 obesity 05/04/2023   Premature atrial contraction 02/28/2022   Hypokalemia 02/28/2022   Pruritus 11/03/2020   Hives 11/03/2020   Annual physical exam 08/02/2020   TMJ (temporomandibular joint disorder) 08/02/2020   Abnormal MRI, lumbar spine 08/02/2020   Lumbar radiculopathy 07/24/2020   Obesity (BMI 30-39.9) 07/18/2020   Left sided sciatica 07/18/2020   Prediabetes 07/18/2020   Osteoporosis 02/28/2018   Vitamin D deficiency 11/05/2017   Environmental and seasonal allergies 03/29/2017   Dermatitis 07/12/2016   Acute low back pain without sciatica 05/29/2016   Numbness and tingling in right hand 05/29/2016   Cardiac murmur 10/31/2015   Hyperlipidemia 06/14/2015   Essential  hypertension 01/29/2011   ABNORMAL ELECTROCARDIOGRAM 01/29/2011    PCP: pt transferring providers.    REFERRING PROVIDER: Tressie Stalker, MD  REFERRING DIAG: ICD-10-CM Intervertebral disc disorders with radiculopathy, lumbar region   Rationale for Evaluation and Treatment: Rehabilitation  THERAPY DIAG:  No diagnosis found.  ONSET DATE: 11/30/2022  SUBJECTIVE:                                                                                                                                                                                           SUBJECTIVE STATEMENT:  Patient reports having a rough weekend with increased right LE pain. States didn't do anything  special but just hurting.     PERTINENT HISTORY:  62 year old black female nonsmoker on whom had a C6-7 anterior cervical diskectomy fusion plating on 02/05/2011. She did well after that surgery and have not seen her in years.The patient returns today with a new problem. She tells me she began having pain in her right leg all on 11/30/2022 without notable precipitating events. The patient was seen at [facility] by several doctors. She had a lumbar MRI. She then had 2 lumbar epidural injections which helped " a few days" . When her pain recurred she was told she had a pinched nerve. She scheduled appointment for my opinion.During today's visit the patient is accompanied by her husband and relates the above information. She complains of pain mainly in her right leg and what sounds like the L4 distribution. She says her right leg/knee gives out. Her symptoms are present more less all the time but worse at night. Her symptoms do not significantly worsened with standing walking. She does not have any radicular symptoms on the left. She has been treated with various medications including a prednisone taper, gabapentin, oxycodone, muscle relaxants, etcetera. She has not had any physical therapy or chiropractic care. She tells me her neck is  doing well.    PAIN:  Are you having pain? Yes: NPRS scale: 7/10 Pain location: lateral R leg  Pain description: nagging dull  ach. Can be burning or tingling.  Aggravating factors: going up/down stairs Relieving factors: none  PRECAUTIONS: None  WEIGHT BEARING RESTRICTIONS: No  FALLS:  Has patient fallen in last 6 months? 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   PLOF: Independent and Independent with basic ADLs  PATIENT GOALS: relief in back pain   NEXT MD VISIT: July 2024   OBJECTIVE:   DIAGNOSTIC FINDINGS:  MRI - R lateral disc bulge at L4-L5  PATIENT SURVEYS:  FOTO 58   SCREENING FOR RED FLAGS: Bowel or bladder incontinence: No Spinal tumors: No Cauda equina syndrome: No Compression fracture: No Abdominal aneurysm: No  COGNITION: Overall cognitive status: Within functional limits for tasks assessed     SENSATION: WFL  MUSCLE LENGTH: Hamstrings: Right decreased lacking 30 deg full extension at knee deg; Left WFL lacking ~15 deg full extension   POSTURE: rounded shoulders, forward head, and weight shift right  PALPATION: Multiple trigger point in R paraspinals and QL. Trigger points found in Piriformis and Glute med/max. Hypermobile in vertebrae T10-L5.   LUMBAR ROM:   AROM eval  Flexion   Extension   Right lateral flexion   Left lateral flexion   Right rotation   Left rotation    (Blank rows = not tested)  LOWER EXTREMITY ROM:     Active  Right eval Left eval  Hip flexion Lacking ~ 15 deg  WFL  Hip extension Unable to lift beyond neutral in prone [facility]  Hip abduction    Hip adduction    Hip internal rotation    Hip external rotation    Knee flexion    Knee extension    Ankle dorsiflexion    Ankle  plantarflexion    Ankle inversion    Ankle eversion     (Blank rows = not tested)  LOWER EXTREMITY MMT:  MMT Right eval Left eval  Hip flexion 4- 4+  Hip extension 3 4-  Hip abduction 4- 5  Hip adduction 4 4+  Hip internal rotation    Hip external rotation    Knee flexion 4 5  Knee extension 4 5  Ankle dorsiflexion 4 5  Ankle plantarflexion 3+ 4+  Ankle inversion    Ankle eversion     (Blank rows = not tested)  LUMBAR SPECIAL TESTS:  Prone instability test: Positive, Straight leg raise test: Positive, Single leg stance test: TBD, SI Compression/distraction test: Negative, and FABER test: Positive  FUNCTIONAL TESTS:  5 times sit to stand: 13.22 with UE support 15.44 with no UE support  Timed up and go (TUG): 10.3  6 minute walk test: 1226ft. Mild increase in pain on the RLE 10 meter walk test: 10.9sec  Functional gait assessment: to be completed on next visit  GAIT: Distance walked: 40 Assistive device utilized: None Level of assistance: Complete Independence Comments: noted lateral cervical and trunk flexion to the R with decreased pelvic rotation Bil.   TODAY'S TREATMENT:                                                                                                                              DATE:         TherEx: Double knee to chest with TrA contraction (LE's on blue theraball) x 20 reps Supine Lower trunk rotation x 20 reps Supine Heel slide with TrA contraction x 10 reps ea LE Supine bridging x 12 reps  Quadraped- Core braced with alt UE raises Childs Pose- hold 30 sec x  Seated hamstring curl 10x each LE with GTB (no pain)  Standing hip ext with TrA contraction x 12 reps alt LE's Standing step up with TrA bracing x 12 reps alt LE's.  Standing calf raises with TrA bracing x 12 reps alt LE's.  Seated piriformis - Hold 30 sec x 2 each LE Seated figure 4- Hold 30 sec x 2 each LE Seated hamstring stretch x 30 sec x 2 each LE  PATIENT EDUCATION:   Education details: POC. Rehab potential. Pt educated throughout session about proper posture and technique with exercises. Improved exercise technique, movement at target joints, use of target muscles after min to mod verbal, visual, tactile cues.  Person educated: Patient Education method: Explanation, Demonstration, and Handouts Education comprehension: verbalized understanding and needs further education  HOME EXERCISE PROGRAM: Access Code: [email or web address] URL: [email or web address] Date: 05/14/2023 Prepared by: Maureen Ralphs  Exercises - Child's Pose with Sidebending  - 1 x daily - 7 x weekly - 3 sets - 20-30 hold - Standing Quadratus Lumborum Stretch with Doorway  - 1 x daily - 7 x weekly - 3 sets - 20-30 hold  Access Code: [email or web address] URL: [email or web address] Date: 05/06/2023 Prepared by: Maureen Ralphs  Exercises - Supine Posterior Pelvic Tilt  - 1 x daily - 7 x weekly - 3 sets - 10  reps - Clamshell  - 1 x daily - 7 x weekly - 3 sets - 10 reps     Access Code: [email or web address] URL: [email or web address] Date: 04/16/2023 Prepared by: Grier Rocher  Exercises - Supine Bridge  - 1 x daily - 7 x weekly - 3 sets - 10 reps - 2 hold - Supine Lower Trunk Rotation  - 1 x daily - 7 x weekly - 3 sets - 10 reps - 3 hold - Seated Piriformis Stretch with Trunk Bend  - 1 x daily - 7 x weekly - 3 sets - 5 reps - 15 hold  ASSESSMENT:  CLINICAL IMPRESSION: Patient able to respond favorably to progression of core/LE strengthening today without report of any increased pain. She did report some LE activities as hard due to some weakness. She continues to respond better to self stretching vs. Manual stretching.  Patient will benefit from Skilled intervention to reduce pain, improve strength,  to allow improved function to return to PLOF and improved overall QoL.    OBJECTIVE IMPAIRMENTS: Abnormal gait, decreased activity tolerance, decreased balance,  decreased endurance, decreased knowledge of condition, decreased mobility, difficulty walking, decreased strength, increased fascial restrictions, impaired perceived functional ability, improper body mechanics, and postural dysfunction.   ACTIVITY LIMITATIONS: carrying, lifting, standing, squatting, stairs, bed mobility, and locomotion level  PARTICIPATION LIMITATIONS: laundry, community activity, and occupation  PERSONAL FACTORS: Age, Fitness, and Past/current experiences are also affecting patient's functional outcome.   REHAB POTENTIAL: Good  CLINICAL DECISION MAKING: Stable/uncomplicated  EVALUATION COMPLEXITY: Moderate   GOALS: Goals reviewed with patient? Yes   SHORT TERM GOALS: Target date: 05/28/2023    Patient will be independent in home exercise program to improve strength/mobility for better functional independence with ADLs. Baseline: 5/16: HEP compliant  Goal status: MET   LONG TERM GOALS: Target date: 07/09/2023    Patient will increase FOTO score to equal to or greater than 68   to demonstrate statistically significant improvement in mobility and quality of life.  Baseline: 54 Goal status: Ongoing   2.  Patient (> 69 years old) will complete five times sit to stand test in < 10 seconds indicating an increased LE strength and improved balance. Baseline: 15.4sec 5/16: 9.4 seconds Goal status: MET  3.  Patient will increase 6 min walk test  to >1500 ft points to demonstrate decreased fall risk during functional activities Baseline: 1263ft. Mild increase in pain on the RLE 5/16: 1410  Goal status: IN PROGRESS  4.  Patient will increase 10 meter walk test to >1.39m/s as to improve gait speed for better community ambulation and to reduce fall risk. Baseline: 0.84m/s 5/1: 1.13 m/s  Goal status: MET  5.  Patient will increase FGA >26 as to demonstrate reduced fall risk and improved dynamic gait balance for better safety with community/home ambulation.  Baseline: to  be completed 4/18: 17/30  5/16: 21 Goal status: IMET and progressed: New goal  6.  Patient will reduce pain to 0-1/10 with functional activities throughout day to demonstrate improved function and reduce impairment  Baseline:5/10 5/16: 5-6/10  Goal status: On going    PLAN:  PT FREQUENCY: 1-2x/week  PT DURATION: 12 weeks  PLANNED INTERVENTIONS: Therapeutic exercises, Therapeutic activity, Neuromuscular re-education, Balance training, Gait training, Patient/Family education, Self Care, Joint mobilization, Joint manipulation, Stair training, Dry Needling, Electrical stimulation, Spinal manipulation, Spinal mobilization, Moist heat, Taping, Traction, and Manual therapy.  PLAN FOR NEXT SESSION:   Continue with core stabilization exercises, manual therapy Pain modulation for  R sided radicular pain, RLE strengthening   Lenda Kelp PT  Physical Therapist - [facility]  [facility]  3:56 PM 05/22/23

## 2023-05-28 NOTE — Telephone Encounter (Signed)
$  0 due, PA required  Sending to PA team

## 2023-05-30 ENCOUNTER — Encounter: Payer: Self-pay | Admitting: Physical Therapy

## 2023-05-30 ENCOUNTER — Ambulatory Visit: Payer: Commercial Managed Care - PPO | Admitting: Physical Therapy

## 2023-05-30 DIAGNOSIS — M6281 Muscle weakness (generalized): Secondary | ICD-10-CM

## 2023-05-30 DIAGNOSIS — R262 Difficulty in walking, not elsewhere classified: Secondary | ICD-10-CM

## 2023-05-30 DIAGNOSIS — M79606 Pain in leg, unspecified: Secondary | ICD-10-CM | POA: Diagnosis not present

## 2023-05-30 DIAGNOSIS — M545 Low back pain, unspecified: Secondary | ICD-10-CM | POA: Diagnosis not present

## 2023-05-30 NOTE — Therapy (Signed)
OUTPATIENT PHYSICAL THERAPY THORACOLUMBAR TREATMENT    Patient Name: Joyce Simpson MRN: [id number] DOB:[birth year], 62 y.o., female Today's Date: 05/30/2023  END OF SESSION:  PT End of Session - 05/30/23 0805     Visit Number 13    Number of Visits 24    Date for PT Re-Evaluation 07/09/23    PT Start Time 0804    PT Stop Time 0845    PT Time Calculation (min) 41 min    Activity Tolerance Patient tolerated treatment well    Behavior During Therapy [name] for tasks assessed/performed                        Past Medical History:  Diagnosis Date   COVID-19    12/19/19   Diverticulosis    Heart murmur    Hyperlipidemia    Hypertension    Migraines    Osteoporosis    Past Surgical History:  Procedure Laterality Date   ABDOMINAL HYSTERECTOMY     dub 2008/2009 h/o abnormal pap    BACK SURGERY     cervical spine  NS   CESAREAN SECTION     x2    COLONOSCOPY WITH PROPOFOL N/A 04/11/2018   Procedure: COLONOSCOPY WITH PROPOFOL;  Surgeon: Wyline Mood, MD;  Location: [facility] ENDOSCOPY;  Service: Gastroenterology;  Laterality: N/A;   COLONOSCOPY WITH PROPOFOL N/A 06/20/2018   Procedure: COLONOSCOPY WITH PROPOFOL;  Surgeon: Wyline Mood, MD;  Location: [facility] ENDOSCOPY;  Service: Gastroenterology;  Laterality: N/A;   COLONOSCOPY WITH PROPOFOL N/A 09/22/2021   Procedure: COLONOSCOPY WITH PROPOFOL;  Surgeon: Wyline Mood, MD;  Location: [facility] ENDOSCOPY;  Service: Gastroenterology;  Laterality: N/A;   Patient Active Problem List   Diagnosis Date Noted   Breast cancer screening by mammogram 05/04/2023   Class 2 obesity 05/04/2023   Premature atrial contraction 02/28/2022   Hypokalemia 02/28/2022   Pruritus 11/03/2020   Hives 11/03/2020   Annual physical exam 08/02/2020   TMJ (temporomandibular joint disorder) 08/02/2020   Abnormal MRI, lumbar spine 08/02/2020   Lumbar radiculopathy 07/24/2020   Obesity (BMI 30-39.9) 07/18/2020   Left sided sciatica 07/18/2020    Prediabetes 07/18/2020   Osteoporosis 02/28/2018   Vitamin D deficiency 11/05/2017   Environmental and seasonal allergies 03/29/2017   Dermatitis 07/12/2016   Acute low back pain without sciatica 05/29/2016   Numbness and tingling in right hand 05/29/2016   Cardiac murmur 10/31/2015   Hyperlipidemia 06/14/2015   Essential hypertension 01/29/2011   ABNORMAL ELECTROCARDIOGRAM 01/29/2011    PCP: pt transferring providers.    REFERRING PROVIDER: Tressie Stalker, MD  REFERRING DIAG: ICD-10-CM Intervertebral disc disorders with radiculopathy, lumbar region   Rationale for Evaluation and Treatment: Rehabilitation  THERAPY DIAG:  Muscle weakness (generalized)  Low back pain radiating down leg  Difficulty in walking, not elsewhere classified  ONSET DATE: 11/30/2022  SUBJECTIVE:  SUBJECTIVE STATEMENT:  Patient is a physical therapy with continued pain in her low back region.  No other significant changes since last session.  Note: Portions of this document were prepared using Dragon voice recognition software and although reviewed may contain unintentional dictation errors in syntax, grammar, or spelling.   PERTINENT HISTORY:  62 year old black female nonsmoker on whom had a C6-7 anterior cervical diskectomy fusion plating on 02/05/2011. She did well after that surgery and have not seen her in years.The patient returns today with a new problem. She tells me she began having pain in her right leg all on 11/30/2022 without notable precipitating events. The patient was seen at [facility] by several doctors. She had a lumbar MRI. She then had 2 lumbar epidural injections which helped " a few days" . When her pain recurred she was told she had a pinched nerve. She scheduled appointment for my opinion.During  today's visit the patient is accompanied by her husband and relates the above information. She complains of pain mainly in her right leg and what sounds like the L4 distribution. She says her right leg/knee gives out. Her symptoms are present more less all the time but worse at night. Her symptoms do not significantly worsened with standing walking. She does not have any radicular symptoms on the left. She has been treated with various medications including a prednisone taper, gabapentin, oxycodone, muscle relaxants, etcetera. She has not had any physical therapy or chiropractic care. She tells me her neck is doing well.    PAIN:  Are you having pain? Yes: NPRS scale: 7/10 Pain location: lateral R leg  Pain description: nagging dull  ach. Can be burning or tingling.  Aggravating factors: going up/down stairs Relieving factors: none  PRECAUTIONS: None  WEIGHT BEARING RESTRICTIONS: No  FALLS:  Has patient fallen in last 6 months? 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   PLOF: Independent and Independent with basic ADLs  PATIENT GOALS: relief in back pain   NEXT MD VISIT: July 2024   OBJECTIVE:   DIAGNOSTIC FINDINGS:  MRI - R lateral disc bulge at L4-L5  PATIENT SURVEYS:  FOTO 58   SCREENING FOR RED FLAGS: Bowel or bladder incontinence: No Spinal tumors: No Cauda equina syndrome: No Compression fracture: No Abdominal aneurysm: No  COGNITION: Overall cognitive status: Within functional limits for tasks assessed     SENSATION: WFL  MUSCLE LENGTH: Hamstrings: Right decreased lacking 30 deg full extension at knee deg; Left WFL lacking ~15 deg full extension   POSTURE: rounded shoulders, forward head, and weight shift  right  PALPATION: Multiple trigger point in R paraspinals and QL. Trigger points found in Piriformis and Glute med/max. Hypermobile in vertebrae T10-L5.   LUMBAR ROM:   AROM eval  Flexion   Extension   Right lateral flexion   Left lateral flexion   Right rotation   Left rotation    (Blank rows = not tested)  LOWER EXTREMITY ROM:     Active  Right eval Left eval  Hip flexion Lacking ~ 15 deg  WFL  Hip extension Unable to lift beyond neutral in prone [facility]  Hip abduction    Hip adduction    Hip internal rotation    Hip external rotation  Knee flexion    Knee extension    Ankle dorsiflexion    Ankle plantarflexion    Ankle inversion    Ankle eversion     (Blank rows = not tested)  LOWER EXTREMITY MMT:    MMT Right eval Left eval  Hip flexion 4- 4+  Hip extension 3 4-  Hip abduction 4- 5  Hip adduction 4 4+  Hip internal rotation    Hip external rotation    Knee flexion 4 5  Knee extension 4 5  Ankle dorsiflexion 4 5  Ankle plantarflexion 3+ 4+  Ankle inversion    Ankle eversion     (Blank rows = not tested)  LUMBAR SPECIAL TESTS:  Prone instability test: Positive, Straight leg raise test: Positive, Single leg stance test: TBD, SI Compression/distraction test: Negative, and FABER test: Positive  FUNCTIONAL TESTS:  5 times sit to stand: 13.22 with UE support 15.44 with no UE support  Timed up and go (TUG): 10.3  6 minute walk test: 1221ft. Mild increase in pain on the RLE 10 meter walk test: 10.9sec  Functional gait assessment: to be completed on next visit  GAIT: Distance walked: 40 Assistive device utilized: None Level of assistance: Complete Independence Comments: noted lateral cervical and trunk flexion to the R with decreased pelvic rotation Bil.   TODAY'S TREATMENT:                                                                                                                              DATE:      Manual  HS stretch 2 x 45 sec R side  only  IASTM to lateral and post gluteam musculature x several minutes     TherEx:  Supine Lower trunk rotation x 20 reps Supine Heel slide with TrA contraction 2 x 10 reps ea LE Supine bridging x 12 reps  Quadraped- Core braced with alt UE raises x 10 ea UE  Standing step up with TrA bracing x 12 reps alt LE's.   Seated piriformis - Hold 30 sec x 2 each LE Seated figure 4- Hold 30 sec x 2 each LE   PATIENT EDUCATION:  Education details: POC. Rehab potential. Pt educated throughout session about proper posture and technique with exercises. Improved exercise technique, movement at target joints, use of target muscles after min to mod verbal, visual, tactile cues.  Person educated: Patient Education method: Explanation, Demonstration, and Handouts Education comprehension: verbalized understanding and needs further education  HOME EXERCISE PROGRAM: Access Code: [email or web address] URL: [email or web address] Date: 05/14/2023 Prepared by: Maureen Ralphs  Exercises - Child's Pose with Sidebending  - 1 x daily - 7 x weekly - 3 sets - 20-30 hold - Standing Quadratus Lumborum Stretch with Doorway  - 1 x daily - 7 x weekly - 3 sets - 20-30 hold  Access Code: [email or web address] URL: [email or web address] Date: 05/06/2023 Prepared by: Maureen Ralphs  Exercises - Supine Posterior Pelvic Tilt  - 1 x  daily - 7 x weekly - 3 sets - 10 reps - Clamshell  - 1 x daily - 7 x weekly - 3 sets - 10 reps     Access Code: [email or web address] URL: [email or web address] Date: 04/16/2023 Prepared by: Grier Rocher  Exercises - Supine Bridge  - 1 x daily - 7 x weekly - 3 sets - 10 reps - 2 hold - Supine Lower Trunk Rotation  - 1 x daily - 7 x weekly - 3 sets - 10 reps - 3 hold - Seated Piriformis Stretch with Trunk Bend  - 1 x daily - 7 x weekly - 3 sets - 5 reps - 15 hold  ASSESSMENT:  CLINICAL IMPRESSION:  Patient presents with excellent motivation for completion of  physical therapy activities.  Patient continues to progress lower extremity strengthening as well as core strengthening activities this date.  Patient continues to work on stretches but has little overall benefit with her pain levels.  Patient continues to make progress with core stabilization will continue to benefit risk of physical therapy to improve her strength, and allow return to function and her prior level of function and improve her quality of life.    OBJECTIVE IMPAIRMENTS: Abnormal gait, decreased activity tolerance, decreased balance, decreased endurance, decreased knowledge of condition, decreased mobility, difficulty walking, decreased strength, increased fascial restrictions, impaired perceived functional ability, improper body mechanics, and postural dysfunction.   ACTIVITY LIMITATIONS: carrying, lifting, standing, squatting, stairs, bed mobility, and locomotion level  PARTICIPATION LIMITATIONS: laundry, community activity, and occupation  PERSONAL FACTORS: Age, Fitness, and Past/current experiences are also affecting patient's functional outcome.   REHAB POTENTIAL: Good  CLINICAL DECISION MAKING: Stable/uncomplicated  EVALUATION COMPLEXITY: Moderate   GOALS: Goals reviewed with patient? Yes   SHORT TERM GOALS: Target date: 05/28/2023    Patient will be independent in home exercise program to improve strength/mobility for better functional independence with ADLs. Baseline: 5/16: HEP compliant  Goal status: MET   LONG TERM GOALS: Target date: 07/09/2023    Patient will increase FOTO score to equal to or greater than 68   to demonstrate statistically significant improvement in mobility and quality of life.  Baseline: 54 Goal status: Ongoing   2.  Patient (> 85 years old) will complete five times sit to stand test in < 10 seconds indicating an increased LE strength and improved balance. Baseline: 15.4sec 5/16: 9.4 seconds Goal status: MET  3.  Patient will  increase 6 min walk test  to >1500 ft points to demonstrate decreased fall risk during functional activities Baseline: 1277ft. Mild increase in pain on the RLE 5/16: 1410  Goal status: IN PROGRESS  4.  Patient will increase 10 meter walk test to >1.41m/s as to improve gait speed for better community ambulation and to reduce fall risk. Baseline: 0.27m/s 5/1: 1.13 m/s  Goal status: MET  5.  Patient will increase FGA >26 as to demonstrate reduced fall risk and improved dynamic gait balance for better safety with community/home ambulation.  Baseline: to be completed 4/18: 17/30  5/16: 21 Goal status: IMET and progressed: New goal  6.  Patient will reduce pain to 0-1/10 with functional activities throughout day to demonstrate improved function and reduce impairment  Baseline:5/10 5/16: 5-6/10  Goal status: On going    PLAN:  PT FREQUENCY: 1-2x/week  PT DURATION: 12 weeks  PLANNED INTERVENTIONS: Therapeutic exercises, Therapeutic activity, Neuromuscular re-education, Balance training, Gait training, Patient/Family education, Self Care, Joint mobilization, Joint manipulation, Stair training, Dry Needling, Electrical stimulation, Spinal  manipulation, Spinal mobilization, Moist heat, Taping, Traction, and Manual therapy.  PLAN FOR NEXT SESSION:   Continue with core stabilization exercises, manual therapy Pain modulation for R sided radicular pain, RLE strengthening    Norman Herrlich PT ,DPT Physical Therapist- [facility]  [facility]   10:02 AM 05/30/23

## 2023-05-31 NOTE — Telephone Encounter (Signed)
PA submitted via [employer].  Authorization Number : [id number]

## 2023-06-03 ENCOUNTER — Ambulatory Visit: Payer: Commercial Managed Care - PPO | Attending: Neurosurgery | Admitting: Physical Therapy

## 2023-06-03 DIAGNOSIS — R262 Difficulty in walking, not elsewhere classified: Secondary | ICD-10-CM | POA: Insufficient documentation

## 2023-06-03 DIAGNOSIS — M545 Low back pain, unspecified: Secondary | ICD-10-CM | POA: Diagnosis not present

## 2023-06-03 DIAGNOSIS — M6281 Muscle weakness (generalized): Secondary | ICD-10-CM | POA: Diagnosis not present

## 2023-06-03 DIAGNOSIS — M79606 Pain in leg, unspecified: Secondary | ICD-10-CM | POA: Insufficient documentation

## 2023-06-03 NOTE — Therapy (Signed)
OUTPATIENT PHYSICAL THERAPY THORACOLUMBAR TREATMENT    Patient Name: Joyce Simpson MRN: [id number] DOB:[birth year], 62 y.o., female Today's Date: 06/03/2023  END OF SESSION:  PT End of Session - 06/03/23 0759     Visit Number 14    Number of Visits 24    Date for PT Re-Evaluation 07/09/23    PT Start Time 0802    PT Stop Time 0846    PT Time Calculation (min) 44 min    Activity Tolerance Patient tolerated treatment well    Behavior During Therapy [facility] for tasks assessed/performed                        Past Medical History:  Diagnosis Date   COVID-19    12/19/19   Diverticulosis    Heart murmur    Hyperlipidemia    Hypertension    Migraines    Osteoporosis    Past Surgical History:  Procedure Laterality Date   ABDOMINAL HYSTERECTOMY     dub 2008/2009 h/o abnormal pap    BACK SURGERY     cervical spine [place] [facility]   CESAREAN SECTION     x2    COLONOSCOPY WITH PROPOFOL N/A 04/11/2018   Procedure: COLONOSCOPY WITH PROPOFOL;  Surgeon: Wyline Mood, MD;  Location: [facility] ENDOSCOPY;  Service: Gastroenterology;  Laterality: N/A;   COLONOSCOPY WITH PROPOFOL N/A 06/20/2018   Procedure: COLONOSCOPY WITH PROPOFOL;  Surgeon: Wyline Mood, MD;  Location: [facility] ENDOSCOPY;  Service: Gastroenterology;  Laterality: N/A;   COLONOSCOPY WITH PROPOFOL N/A 09/22/2021   Procedure: COLONOSCOPY WITH PROPOFOL;  Surgeon: Wyline Mood, MD;  Location: [facility] ENDOSCOPY;  Service: Gastroenterology;  Laterality: N/A;   Patient Active Problem List   Diagnosis Date Noted   Breast cancer screening by mammogram 05/04/2023   Class 2 obesity 05/04/2023   Premature atrial contraction 02/28/2022   Hypokalemia 02/28/2022   Pruritus 11/03/2020   Hives 11/03/2020   Annual physical exam 08/02/2020   TMJ (temporomandibular joint disorder) 08/02/2020   Abnormal MRI, lumbar spine 08/02/2020   Lumbar radiculopathy 07/24/2020   Obesity (BMI 30-39.9) 07/18/2020   Left sided sciatica 07/18/2020    Prediabetes 07/18/2020   Osteoporosis 02/28/2018   Vitamin D deficiency 11/05/2017   Environmental and seasonal allergies 03/29/2017   Dermatitis 07/12/2016   Acute low back pain without sciatica 05/29/2016   Numbness and tingling in right hand 05/29/2016   Cardiac murmur 10/31/2015   Hyperlipidemia 06/14/2015   Essential hypertension 01/29/2011   ABNORMAL ELECTROCARDIOGRAM 01/29/2011    PCP: pt transferring providers.    REFERRING PROVIDER: Tressie Stalker, MD  REFERRING DIAG: ICD-10-CM Intervertebral disc disorders with radiculopathy, lumbar region   Rationale for Evaluation and Treatment: Rehabilitation  THERAPY DIAG:  Muscle weakness (generalized)  Low back pain radiating down leg  Difficulty in walking, not elsewhere classified  ONSET DATE: 11/30/2022  SUBJECTIVE:  SUBJECTIVE STATEMENT:  Pt reports that today is better than yesterday, but reports that pain is still 7/10, was 8-9/10 on Sunday. Reports sensation of radicular s/s into R leg.    Note: Portions of this document were prepared using Dragon voice recognition software and although reviewed may contain unintentional dictation errors in syntax, grammar, or spelling.   PERTINENT HISTORY:  62 year old black female nonsmoker on whom had a C6-7 anterior cervical diskectomy fusion plating on 02/05/2011. She did well after that surgery and have not seen her in years.The patient returns today with a new problem. She tells me she began having pain in her right leg all on 11/30/2022 without notable precipitating events. The patient was seen at [facility] by several doctors. She had a lumbar MRI. She then had 2 lumbar epidural injections which helped " a few days" . When her pain recurred she was told she had a pinched nerve. She scheduled  appointment for my opinion.During today's visit the patient is accompanied by her husband and relates the above information. She complains of pain mainly in her right leg and what sounds like the L4 distribution. She says her right leg/knee gives out. Her symptoms are present more less all the time but worse at night. Her symptoms do not significantly worsened with standing walking. She does not have any radicular symptoms on the left. She has been treated with various medications including a prednisone taper, gabapentin, oxycodone, muscle relaxants, etcetera. She has not had any physical therapy or chiropractic care. She tells me her neck is doing well.    PAIN:  Are you having pain? Yes: NPRS scale: 7/10 Pain location: lateral R leg  Pain description: nagging dull  ach. Can be burning or tingling.  Aggravating factors: going up/down stairs Relieving factors: none  PRECAUTIONS: None  WEIGHT BEARING RESTRICTIONS: No  FALLS:  Has patient fallen in last 6 months? 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   PLOF: Independent and Independent with basic ADLs  PATIENT GOALS: relief in back pain   NEXT MD VISIT: July 2024   OBJECTIVE:   DIAGNOSTIC FINDINGS:  MRI - R lateral disc bulge at L4-L5  PATIENT SURVEYS:  FOTO 58   SCREENING FOR RED FLAGS: Bowel or bladder incontinence: No Spinal tumors: No Cauda equina syndrome: No Compression fracture: No Abdominal aneurysm: No  COGNITION: Overall cognitive status: Within functional limits for tasks assessed     SENSATION: WFL  MUSCLE LENGTH: Hamstrings: Right decreased lacking 30 deg full extension at knee deg; Left WFL lacking ~15 deg full extension   POSTURE: rounded shoulders, forward head, and  weight shift right  PALPATION: Multiple trigger point in R paraspinals and QL. Trigger points found in Piriformis and Glute med/max. Hypermobile in vertebrae T10-L5.   LUMBAR ROM:   AROM eval  Flexion   Extension   Right lateral flexion   Left lateral flexion   Right rotation   Left rotation    (Blank rows = not tested)  LOWER EXTREMITY ROM:     Active  Right eval Left eval  Hip flexion Lacking ~ 15 deg  WFL  Hip extension Unable to lift beyond neutral in prone [facility]  Hip abduction    Hip adduction    Hip internal  rotation    Hip external rotation    Knee flexion    Knee extension    Ankle dorsiflexion    Ankle plantarflexion    Ankle inversion    Ankle eversion     (Blank rows = not tested)  LOWER EXTREMITY MMT:    MMT Right eval Left eval  Hip flexion 4- 4+  Hip extension 3 4-  Hip abduction 4- 5  Hip adduction 4 4+  Hip internal rotation    Hip external rotation    Knee flexion 4 5  Knee extension 4 5  Ankle dorsiflexion 4 5  Ankle plantarflexion 3+ 4+  Ankle inversion    Ankle eversion     (Blank rows = not tested)  LUMBAR SPECIAL TESTS:  Prone instability test: Positive, Straight leg raise test: Positive, Single leg stance test: TBD, SI Compression/distraction test: Negative, and FABER test: Positive  FUNCTIONAL TESTS:  5 times sit to stand: 13.22 with UE support 15.44 with no UE support  Timed up and go (TUG): 10.3  6 minute walk test: 125ft. Mild increase in pain on the RLE 10 meter walk test: 10.9sec  Functional gait assessment: to be completed on next visit  GAIT: Distance walked: 40 Assistive device utilized: None Level of assistance: Complete Independence Comments: noted lateral cervical and trunk flexion to the R with decreased pelvic rotation Bil.   TODAY'S TREATMENT:                                                                                                                              DATE:    LTR x 20 with 3 sec hold  SLR x  2 x 8  Dual knees to chest x 8  Bridge 2x 10   Qped UE reach 2 x 10  Qped donkey kick 2 x 8  Sidelying open book x 8 bil   Manual:  HS stretch 2 x 40 sec bil Figure 4 stretch x 40 sec bil Piriformis stretch 2x 40 sec bil   Increased ROM noted in RLE compared to L on this day  STM to proximal glute med, TFL, IT band. X 4 min. Pt noted to have sensitivity in greater trochanter at bursa site.       PATIENT EDUCATION:  Education details: POC. Rehab potential. Pt educated throughout session about proper posture and technique with exercises. Improved exercise technique, movement at target joints, use of target muscles after min to mod verbal, visual, tactile cues.  Person educated: Patient Education method: Explanation, Demonstration, and Handouts Education comprehension: verbalized understanding and needs further education  HOME EXERCISE PROGRAM: Access Code: [email or web address] URL: [email or web address] Date: 05/14/2023 Prepared by: Maureen Ralphs  Exercises - Child's Pose with Sidebending  - 1 x daily - 7 x weekly - 3 sets - 20-30 hold - Standing Quadratus Lumborum Stretch with Doorway  - 1 x daily - 7 x weekly - 3 sets - 20-30 hold  Access Code:  [email or web address] URL: [email or web address] Date: 05/06/2023 Prepared by: Maureen Ralphs  Exercises - Supine Posterior Pelvic Tilt  - 1 x daily - 7 x weekly - 3 sets - 10 reps - Clamshell  - 1 x daily - 7 x weekly - 3 sets - 10 reps     Access Code: [email or web address] URL: [email or web address] Date: 04/16/2023 Prepared by: Grier Rocher  Exercises - Supine Bridge  - 1 x daily - 7 x weekly - 3 sets - 10 reps - 2 hold - Supine Lower Trunk Rotation  - 1 x daily - 7 x weekly - 3 sets - 10 reps - 3 hold - Seated Piriformis Stretch with Trunk Bend  - 1 x daily - 7 x weekly - 3 sets - 5 reps - 15 hold  ASSESSMENT:  CLINICAL IMPRESSION:  Patient presents with excellent motivation for completion of  physical therapy activities.  Patient continues to progress lower extremity strengthening as well as core strengthening activities this date.  Mild improvement in pain at end of PT treatment. Noted to have increased hip tightness on the RLE compared to LLE, and increases sensitivity to STM at righ hip trochanteric bursa.  Patient continues to make progress with core stabilization will continue to benefit risk of physical therapy to improve her strength, and allow return to function and her prior level of function and improve her quality of life.    OBJECTIVE IMPAIRMENTS: Abnormal gait, decreased activity tolerance, decreased balance, decreased endurance, decreased knowledge of condition, decreased mobility, difficulty walking, decreased strength, increased fascial restrictions, impaired perceived functional ability, improper body mechanics, and postural dysfunction.   ACTIVITY LIMITATIONS: carrying, lifting, standing, squatting, stairs, bed mobility, and locomotion level  PARTICIPATION LIMITATIONS: laundry, community activity, and occupation  PERSONAL FACTORS: Age, Fitness, and Past/current experiences are also affecting patient's functional outcome.   REHAB POTENTIAL: Good  CLINICAL DECISION MAKING: Stable/uncomplicated  EVALUATION COMPLEXITY: Moderate   GOALS: Goals reviewed with patient? Yes   SHORT TERM GOALS: Target date: 05/28/2023    Patient will be independent in home exercise program to improve strength/mobility for better functional independence with ADLs. Baseline: 5/16: HEP compliant  Goal status: MET   LONG TERM GOALS: Target date: 07/09/2023    Patient will increase FOTO score to equal to or greater than 68   to demonstrate statistically significant improvement in mobility and quality of life.  Baseline: 54 Goal status: Ongoing   2.  Patient (> 34 years old) will complete five times sit to stand test in < 10 seconds indicating an increased LE strength and improved  balance. Baseline: 15.4sec 5/16: 9.4 seconds Goal status: MET  3.  Patient will increase 6 min walk test  to >1500 ft points to demonstrate decreased fall risk during functional activities Baseline: 1258ft. Mild increase in pain on the RLE 5/16: 1410  Goal status: IN PROGRESS  4.  Patient will increase 10 meter walk test to >1.68m/s as to improve gait speed for better community ambulation and to reduce fall risk. Baseline: 0.6m/s 5/1: 1.13 m/s  Goal status: MET  5.  Patient will increase FGA >26 as to demonstrate reduced fall risk and improved dynamic gait balance for better safety with community/home ambulation.  Baseline: to be completed 4/18: 17/30  5/16: 21 Goal status: IMET and progressed: New goal  6.  Patient will reduce pain to 0-1/10 with functional activities throughout day to demonstrate improved function and reduce impairment  Baseline:5/10 5/16: 5-6/10  Goal status: On going    PLAN:  PT FREQUENCY: 1-2x/week  PT DURATION: 12 weeks  PLANNED INTERVENTIONS: Therapeutic exercises, Therapeutic activity, Neuromuscular re-education, Balance training, Gait training, Patient/Family education, Self Care, Joint mobilization, Joint manipulation, Stair training, Dry Needling, Electrical stimulation, Spinal manipulation, Spinal mobilization, Moist heat, Taping, Traction, and Manual therapy.  PLAN FOR NEXT SESSION:   Continue with core stabilization exercises, manual therapy Pain modulation for R sided radicular pain, RLE strengthening    Golden Pop PT ,DPT Physical Therapist- [facility] [address]  [facility]   9:11 AM 06/03/23

## 2023-06-04 NOTE — Therapy (Signed)
OUTPATIENT PHYSICAL THERAPY THORACOLUMBAR TREATMENT    Patient Name: Joyce Simpson MRN: [id number] DOB:[birth year], 62 y.o., female Today's Date: 06/05/2023  END OF SESSION:  PT End of Session - 06/05/23 0759     Visit Number 15    Number of Visits 24    Date for PT Re-Evaluation 07/09/23    Progress Note Due on Visit 20    PT Start Time 0800    PT Stop Time 0842    PT Time Calculation (min) 42 min    Activity Tolerance Patient tolerated treatment well    Behavior During Therapy [name] for tasks assessed/performed                         Past Medical History:  Diagnosis Date   COVID-19    12/19/19   Diverticulosis    Heart murmur    Hyperlipidemia    Hypertension    Migraines    Osteoporosis    Past Surgical History:  Procedure Laterality Date   ABDOMINAL HYSTERECTOMY     dub 2008/2009 h/o abnormal pap    BACK SURGERY     cervical spine  NS   CESAREAN SECTION     x2    COLONOSCOPY WITH PROPOFOL N/A 04/11/2018   Procedure: COLONOSCOPY WITH PROPOFOL;  Surgeon: Wyline Mood, MD;  Location: [facility] ENDOSCOPY;  Service: Gastroenterology;  Laterality: N/A;   COLONOSCOPY WITH PROPOFOL N/A 06/20/2018   Procedure: COLONOSCOPY WITH PROPOFOL;  Surgeon: Wyline Mood, MD;  Location: [facility] ENDOSCOPY;  Service: Gastroenterology;  Laterality: N/A;   COLONOSCOPY WITH PROPOFOL N/A 09/22/2021   Procedure: COLONOSCOPY WITH PROPOFOL;  Surgeon: Wyline Mood, MD;  Location: [facility] ENDOSCOPY;  Service: Gastroenterology;  Laterality: N/A;   Patient Active Problem List   Diagnosis Date Noted   Breast cancer screening by mammogram 05/04/2023   Class 2 obesity 05/04/2023   Premature atrial contraction 02/28/2022   Hypokalemia 02/28/2022   Pruritus 11/03/2020   Hives 11/03/2020   Annual physical exam 08/02/2020   TMJ (temporomandibular joint disorder) 08/02/2020   Abnormal MRI, lumbar spine 08/02/2020   Lumbar radiculopathy 07/24/2020   Obesity (BMI 30-39.9) 07/18/2020   Left  sided sciatica 07/18/2020   Prediabetes 07/18/2020   Osteoporosis 02/28/2018   Vitamin D deficiency 11/05/2017   Environmental and seasonal allergies 03/29/2017   Dermatitis 07/12/2016   Acute low back pain without sciatica 05/29/2016   Numbness and tingling in right hand 05/29/2016   Cardiac murmur 10/31/2015   Hyperlipidemia 06/14/2015   Essential hypertension 01/29/2011   ABNORMAL ELECTROCARDIOGRAM 01/29/2011    PCP: pt transferring providers.    REFERRING PROVIDER: Tressie Stalker, MD  REFERRING DIAG: ICD-10-CM Intervertebral disc disorders with radiculopathy, lumbar region   Rationale for Evaluation and Treatment: Rehabilitation  THERAPY DIAG:  Muscle weakness (generalized)  Low back pain radiating down leg  Difficulty in walking, not elsewhere classified  ONSET DATE: 11/30/2022  SUBJECTIVE:  SUBJECTIVE STATEMENT:  Patient reports pain is the same - still going down the right leg. Next MD appt in 07/09/2023. Rates at 7.5/10.    Note: Portions of this document were prepared using Dragon voice recognition software and although reviewed may contain unintentional dictation errors in syntax, grammar, or spelling.   PERTINENT HISTORY:  62 year old black female nonsmoker on whom had a C6-7 anterior cervical diskectomy fusion plating on 02/05/2011. She did well after that surgery and have not seen her in years.The patient returns today with a new problem. She tells me she began having pain in her right leg all on 11/30/2022 without notable precipitating events. The patient was seen at [facility] by several doctors. She had a lumbar MRI. She then had 2 lumbar epidural injections which helped " a few days" . When her pain recurred she was told she had a pinched nerve. She scheduled appointment for  my opinion.During today's visit the patient is accompanied by her husband and relates the above information. She complains of pain mainly in her right leg and what sounds like the L4 distribution. She says her right leg/knee gives out. Her symptoms are present more less all the time but worse at night. Her symptoms do not significantly worsened with standing walking. She does not have any radicular symptoms on the left. She has been treated with various medications including a prednisone taper, gabapentin, oxycodone, muscle relaxants, etcetera. She has not had any physical therapy or chiropractic care. She tells me her neck is doing well.    PAIN:  Are you having pain? Yes: NPRS scale: 7/10 Pain location: lateral R leg  Pain description: nagging dull  ach. Can be burning or tingling.  Aggravating factors: going up/down stairs Relieving factors: none  PRECAUTIONS: None  WEIGHT BEARING RESTRICTIONS: No  FALLS:  Has patient fallen in last 6 months? 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   PLOF: Independent and Independent with basic ADLs  PATIENT GOALS: relief in back pain   NEXT MD VISIT: July 2024   OBJECTIVE:   DIAGNOSTIC FINDINGS:  MRI - R lateral disc bulge at L4-L5  PATIENT SURVEYS:  FOTO 58   SCREENING FOR RED FLAGS: Bowel or bladder incontinence: No Spinal tumors: No Cauda equina syndrome: No Compression fracture: No Abdominal aneurysm: No  COGNITION: Overall cognitive status: Within functional limits for tasks assessed     SENSATION: WFL  MUSCLE LENGTH: Hamstrings: Right decreased lacking 30 deg full extension at knee deg; Left WFL lacking ~15 deg full extension   POSTURE: rounded shoulders, forward head, and weight shift  right  PALPATION: Multiple trigger point in R paraspinals and QL. Trigger points found in Piriformis and Glute med/max. Hypermobile in vertebrae T10-L5.   LUMBAR ROM:   AROM eval  Flexion   Extension   Right lateral flexion   Left lateral flexion   Right rotation   Left rotation    (Blank rows = not tested)  LOWER EXTREMITY ROM:     Active  Right eval Left eval  Hip flexion Lacking ~ 15 deg  WFL  Hip extension Unable to lift beyond neutral in prone [facility]  Hip abduction    Hip adduction    Hip internal rotation    Hip external  rotation    Knee flexion    Knee extension    Ankle dorsiflexion    Ankle plantarflexion    Ankle inversion    Ankle eversion     (Blank rows = not tested)  LOWER EXTREMITY MMT:    MMT Right eval Left eval  Hip flexion 4- 4+  Hip extension 3 4-  Hip abduction 4- 5  Hip adduction 4 4+  Hip internal rotation    Hip external rotation    Knee flexion 4 5  Knee extension 4 5  Ankle dorsiflexion 4 5  Ankle plantarflexion 3+ 4+  Ankle inversion    Ankle eversion     (Blank rows = not tested)  LUMBAR SPECIAL TESTS:  Prone instability test: Positive, Straight leg raise test: Positive, Single leg stance test: TBD, SI Compression/distraction test: Negative, and FABER test: Positive  FUNCTIONAL TESTS:  5 times sit to stand: 13.22 with UE support 15.44 with no UE support  Timed up and go (TUG): 10.3  6 minute walk test: 1269ft. Mild increase in pain on the RLE 10 meter walk test: 10.9sec  Functional gait assessment: to be completed on next visit  GAIT: Distance walked: 40 Assistive device utilized: None Level of assistance: Complete Independence Comments: noted lateral cervical and trunk flexion to the R with decreased pelvic rotation Bil.   TODAY'S TREATMENT:                                                                                                                              DATE:   Single knee to chest  with sheet- hold 30 sec x  3 each LE Hamstring stretch with sheet- Hold 30 sec x 3 each LE  Transverse Ab contract with ball squeeze - hold 3 sec x 12 reps (VC and tactile cues to perform correct and for breathing)  Bridging 2 x 12 reps Transverse Ab contract with Hip abd (GTB) 3 sec hold x 12 reps LTR x 20 with 3 sec hold  Sidelying open book x 12 bil  Sidelye clamshell DKC with Green theraball x 15 reps   Manual:    STM  including some hand pressure and some use of rolling stick to Lateral quad from proximal down to knee, proximal glute med, TFL, IT band. X 8 min. Pt noted to have increased tight bands surrounding lateral quads and mid to distal IT band region.     PATIENT EDUCATION:  Education details: POC. Rehab potential. Pt educated throughout session about proper posture and technique with exercises. Improved exercise technique, movement at target joints, use of target muscles after min to mod verbal, visual, tactile cues.  Person educated: Patient Education method: Explanation, Demonstration, and Handouts Education comprehension: verbalized understanding and needs further education  HOME EXERCISE PROGRAM: Access Code: [email or web address] URL: [email or web address] Date: 05/14/2023 Prepared by: Maureen Ralphs  Exercises - Child's Pose with Sidebending  - 1 x daily - 7 x weekly - 3  sets - 20-30 hold - Standing Quadratus Lumborum Stretch with Doorway  - 1 x daily - 7 x weekly - 3 sets - 20-30 hold  Access Code: [email or web address] URL: [email or web address] Date: 05/06/2023 Prepared by: Maureen Ralphs  Exercises - Supine Posterior Pelvic Tilt  - 1 x daily - 7 x weekly - 3 sets - 10 reps - Clamshell  - 1 x daily - 7 x weekly - 3 sets - 10 reps     Access Code: [email or web address] URL: [email or web address] Date: 04/16/2023 Prepared by: Grier Rocher  Exercises - Supine Bridge  - 1 x daily - 7 x weekly - 3 sets - 10 reps - 2 hold - Supine Lower Trunk Rotation  - 1 x daily  - 7 x weekly - 3 sets - 10 reps - 3 hold - Seated Piriformis Stretch with Trunk Bend  - 1 x daily - 7 x weekly - 3 sets - 5 reps - 15 hold  ASSESSMENT:  CLINICAL IMPRESSION:  Patient presents with good motivation but nominal subjective report of any improvement in pain. She does endorse some decreased pain after STM- temporary and able to progress with Core strengthening with no report of increased She does present with some taut bands - painful along lateral quad and IT band region- improved with rolling stick and patient able to tolerate pressure well today. Patient continues to make progress with core stabilization will continue to benefit risk of physical therapy to improve her strength, and allow return to function and her prior level of function and improve her quality of life.    OBJECTIVE IMPAIRMENTS: Abnormal gait, decreased activity tolerance, decreased balance, decreased endurance, decreased knowledge of condition, decreased mobility, difficulty walking, decreased strength, increased fascial restrictions, impaired perceived functional ability, improper body mechanics, and postural dysfunction.   ACTIVITY LIMITATIONS: carrying, lifting, standing, squatting, stairs, bed mobility, and locomotion level  PARTICIPATION LIMITATIONS: laundry, community activity, and occupation  PERSONAL FACTORS: Age, Fitness, and Past/current experiences are also affecting patient's functional outcome.   REHAB POTENTIAL: Good  CLINICAL DECISION MAKING: Stable/uncomplicated  EVALUATION COMPLEXITY: Moderate   GOALS: Goals reviewed with patient? Yes   SHORT TERM GOALS: Target date: 05/28/2023    Patient will be independent in home exercise program to improve strength/mobility for better functional independence with ADLs. Baseline: 5/16: HEP compliant  Goal status: MET   LONG TERM GOALS: Target date: 07/09/2023    Patient will increase FOTO score to equal to or greater than 68   to demonstrate  statistically significant improvement in mobility and quality of life.  Baseline: 54 Goal status: Ongoing   2.  Patient (> 53 years old) will complete five times sit to stand test in < 10 seconds indicating an increased LE strength and improved balance. Baseline: 15.4sec 5/16: 9.4 seconds Goal status: MET  3.  Patient will increase 6 min walk test  to >1500 ft points to demonstrate decreased fall risk during functional activities Baseline: 1275ft. Mild increase in pain on the RLE 5/16: 1410  Goal status: IN PROGRESS  4.  Patient will increase 10 meter walk test to >1.16m/s as to improve gait speed for better community ambulation and to reduce fall risk. Baseline: 0.20m/s 5/1: 1.13 m/s  Goal status: MET  5.  Patient will increase FGA >26 as to demonstrate reduced fall risk and improved dynamic gait balance for better safety with community/home ambulation.  Baseline: to be completed 4/18: 17/30  5/16: 21 Goal status: IMET and progressed: New goal  6.  Patient will reduce pain to 0-1/10 with functional activities throughout day to demonstrate improved function and reduce impairment  Baseline:5/10 5/16: 5-6/10  Goal status: On going    PLAN:  PT FREQUENCY: 1-2x/week  PT DURATION: 12 weeks  PLANNED INTERVENTIONS: Therapeutic exercises, Therapeutic activity, Neuromuscular re-education, Balance training, Gait training, Patient/Family education, Self Care, Joint mobilization, Joint manipulation, Stair training, Dry Needling, Electrical stimulation, Spinal manipulation, Spinal mobilization, Moist heat, Taping, Traction, and Manual therapy.  PLAN FOR NEXT SESSION:   Continue with core stabilization exercises, manual therapy Pain modulation for R sided radicular pain, RLE strengthening    Lenda Kelp PT  Physical Therapist- [facility]  [facility]   10:41 AM 06/05/23

## 2023-06-05 ENCOUNTER — Ambulatory Visit: Payer: Commercial Managed Care - PPO

## 2023-06-05 DIAGNOSIS — R262 Difficulty in walking, not elsewhere classified: Secondary | ICD-10-CM

## 2023-06-05 DIAGNOSIS — M79606 Pain in leg, unspecified: Secondary | ICD-10-CM | POA: Diagnosis not present

## 2023-06-05 DIAGNOSIS — M545 Low back pain, unspecified: Secondary | ICD-10-CM

## 2023-06-05 DIAGNOSIS — M6281 Muscle weakness (generalized): Secondary | ICD-10-CM | POA: Diagnosis not present

## 2023-06-06 ENCOUNTER — Encounter: Payer: Self-pay | Admitting: Family Medicine

## 2023-06-06 ENCOUNTER — Other Ambulatory Visit: Payer: Self-pay

## 2023-06-06 ENCOUNTER — Ambulatory Visit: Payer: Commercial Managed Care - PPO | Admitting: Family Medicine

## 2023-06-06 VITALS — BP 128/84 | HR 81 | Temp 97.8°F | Ht 60.0 in | Wt 191.0 lb

## 2023-06-06 DIAGNOSIS — E785 Hyperlipidemia, unspecified: Secondary | ICD-10-CM | POA: Diagnosis not present

## 2023-06-06 DIAGNOSIS — M810 Age-related osteoporosis without current pathological fracture: Secondary | ICD-10-CM

## 2023-06-06 DIAGNOSIS — I10 Essential (primary) hypertension: Secondary | ICD-10-CM

## 2023-06-06 DIAGNOSIS — M5416 Radiculopathy, lumbar region: Secondary | ICD-10-CM | POA: Diagnosis not present

## 2023-06-06 DIAGNOSIS — E559 Vitamin D deficiency, unspecified: Secondary | ICD-10-CM

## 2023-06-06 MED ORDER — DENOSUMAB 60 MG/ML ~~LOC~~ SOSY
60.0000 mg | PREFILLED_SYRINGE | Freq: Once | SUBCUTANEOUS | Status: AC
  Administered 2023-06-06: 60 mg via SUBCUTANEOUS

## 2023-06-06 MED ORDER — PRAVASTATIN SODIUM 20 MG PO TABS
20.0000 mg | ORAL_TABLET | Freq: Every day | ORAL | 3 refills | Status: DC
  Filled 2023-06-06: qty 90, 90d supply, fill #0

## 2023-06-06 NOTE — Patient Instructions (Addendum)
It was a pleasure meeting you today. Thank you for allowing me to take part in your health care.  Our goals for today as we discussed include:  Restart Pravastatin 20 mg daily.  Prescription sent  Continue Vitamin D 1.25 mg weekly.  Once completed can take over the counter Vitamin D daily  Recommend Calcium 1200 mg daily  Received Prolia injection  PAP is due in August.  Please schedule appointment  Referral sent for Mammogram. Please call to schedule appointment. [facility] [address] [place], [place] [address] [phone]    Recommend Shingles vaccine.  This is a 2 dose series and can be given at your local pharmacy.  Please talk to your pharmacist about this.     If you have any questions or concerns, please do not hesitate to call the office at [phone].  I look forward to our next visit and until then take care and stay safe.  Regards,   Dana Allan, MD   [facility]

## 2023-06-06 NOTE — Progress Notes (Signed)
SUBJECTIVE:   Chief Complaint  Patient presents with   Follow-up    Pt stated--lower left back pain radiated left leg--still painful.   HPI Patient presents to clinic to follow-up chronic disease management.  No acute concerns today.  Hypertension Doing well.  Compliant with current medication Norvasc 5 mg and Hyzaar 50-12.5 mg daily.  Continues to be asymptomatic.  Hyperlipidemia Had not been taking Pravachol.  Recent LDL remains not at goal.  Willing to restart medication.  Osteoporosis Prolia has not been approved.  Patient able to receive medication today.  Recent labs within normal limits.  Recent DEXA 10/23 that showed improvement from previous imaging.  Lumbar radiculopathy Patient reports plans for back surgery in July.  She currently follows with Dr. Lovell Sheehan at [facility] neurosurgery in [place].  PERTINENT PMH / PSH: Osteoporosis Hypertension Lumbar radiculopathy Hyperlipidemia  OBJECTIVE:  BP 128/84 (BP Location: Left Arm, Patient Position: Sitting, Cuff Size: Large)   Pulse 81   Temp 97.8 F (36.6 C)   Ht 5' (1.524 m)   Wt 191 lb (86.6 kg)   SpO2 98%   BMI 37.30 kg/m    Physical Exam Vitals reviewed.  Constitutional:      General: She is not in acute distress.    Appearance: She is not ill-appearing.  HENT:     Head: Normocephalic.     Nose: Nose normal.  Eyes:     Conjunctiva/sclera: Conjunctivae normal.  Neck:     Thyroid: No thyromegaly or thyroid tenderness.  Cardiovascular:     Rate and Rhythm: Normal rate and regular rhythm.     Heart sounds: Normal heart sounds.  Pulmonary:     Effort: Pulmonary effort is normal.     Breath sounds: Normal breath sounds.  Abdominal:     General: Abdomen is flat. Bowel sounds are normal.     Palpations: Abdomen is soft.  Musculoskeletal:        General: Normal range of motion.     Cervical back: Normal range of motion.  Neurological:     Mental Status: She is alert and oriented to person, place,  and time. Mental status is at baseline.  Psychiatric:        Mood and Affect: Mood normal.        Behavior: Behavior normal.        Thought Content: Thought content normal.        Judgment: Judgment normal.     ASSESSMENT/PLAN:  Osteoporosis, unspecified osteoporosis type, unspecified pathological fracture presence Assessment & Plan: Chronic.  Prolia injection now approved Labs up-to-date and within normal limits.  Recent DEXA 10/23, T-score -1.9. Prolia injection administered by CMA today Continue calcium and vitamin D supplement Follow-up in 6 months for repeat injection     Orders: -     Denosumab  Essential hypertension Assessment & Plan: Chronic.  Stable.  Well-controlled on current medications. Continue Hyzaar 50-12.5 mg daily Continue Amlodipine 5 mg daily Monitor BP at home.  Goal less than 140/90 per JNC 8 guidelines   Hyperlipidemia, unspecified hyperlipidemia type Assessment & Plan: Chronic.  Recent LDL elevated not at goal. Restart Pravachol 20 mg daily   Orders: -     Pravastatin Sodium; Take 1 tablet (20 mg total) by mouth daily.  Dispense: 90 tablet; Refill: 3  Lumbar radiculopathy Assessment & Plan: Chronic.  Stable.  Has been following with physical therapy and doing well. Plan for surgical intervention in July 2024. Follows with Dr. Lovell Sheehan at [facility] neurosurgery  in [place].   Vitamin D deficiency Assessment & Plan: Chronic. Start vitamin D 1.25 mg weekly      HCM Recommend shingles vaccine Last Pap 07/2020.  NILM, HPV negative.  Due 08/24-26.  Patient aware to schedule appointment. Mammogram up-to-date.  Due 10/2023.  Referral has been.  Patient aware to call  for an appointment. Recent DEXA 10/23, T-score -2.9, improved from -1.9.  Repeat DEXA 09/2024 Colonoscopy up-to-date.  Recommend repeat surveillance in 5 years due to personal history of colon polyps.  Due 9/27 Tetanus up-to-date   PDMP reviewed  Return in about 6 months  (around 12/06/2023) for PCP, and Prolia injection.  Dana Allan, MD

## 2023-06-06 NOTE — Telephone Encounter (Signed)
PA approved. Case Number: [id number]

## 2023-06-06 NOTE — Telephone Encounter (Signed)
I did not receive this back. Pt in office now for an OV. I will notify her and give Prolia injection today during visit.

## 2023-06-07 ENCOUNTER — Ambulatory Visit: Payer: Commercial Managed Care - PPO | Admitting: Physical Therapy

## 2023-06-10 ENCOUNTER — Ambulatory Visit: Payer: Commercial Managed Care - PPO

## 2023-06-10 DIAGNOSIS — M6281 Muscle weakness (generalized): Secondary | ICD-10-CM | POA: Diagnosis not present

## 2023-06-10 DIAGNOSIS — M545 Low back pain, unspecified: Secondary | ICD-10-CM | POA: Diagnosis not present

## 2023-06-10 DIAGNOSIS — M79606 Pain in leg, unspecified: Secondary | ICD-10-CM | POA: Diagnosis not present

## 2023-06-10 DIAGNOSIS — R262 Difficulty in walking, not elsewhere classified: Secondary | ICD-10-CM

## 2023-06-10 NOTE — Therapy (Signed)
OUTPATIENT PHYSICAL THERAPY THORACOLUMBAR TREATMENT    Patient Name: Joyce Simpson MRN: [id number] DOB:1961-07-14, 62 y.o., female Today's Date: 06/10/2023  END OF SESSION:  PT End of Session - 06/10/23 0801     Visit Number 16    Number of Visits 24    Date for PT Re-Evaluation 07/09/23    Progress Note Due on Visit 20    PT Start Time 0800    PT Stop Time 0844    PT Time Calculation (min) 44 min    Activity Tolerance Patient tolerated treatment well    Behavior During Therapy [facility] for tasks assessed/performed                         Past Medical History:  Diagnosis Date   COVID-19    12/19/19   Diverticulosis    Heart murmur    Hyperlipidemia    Hypertension    Migraines    Osteoporosis    Past Surgical History:  Procedure Laterality Date   ABDOMINAL HYSTERECTOMY     dub 2008/2009 h/o abnormal pap    BACK SURGERY     cervical spine Yates NS   CESAREAN SECTION     x2    COLONOSCOPY WITH PROPOFOL N/A 04/11/2018   Procedure: COLONOSCOPY WITH PROPOFOL;  Surgeon: Wyline Mood, MD;  Location: [facility] ENDOSCOPY;  Service: Gastroenterology;  Laterality: N/A;   COLONOSCOPY WITH PROPOFOL N/A 06/20/2018   Procedure: COLONOSCOPY WITH PROPOFOL;  Surgeon: Wyline Mood, MD;  Location: [facility] ENDOSCOPY;  Service: Gastroenterology;  Laterality: N/A;   COLONOSCOPY WITH PROPOFOL N/A 09/22/2021   Procedure: COLONOSCOPY WITH PROPOFOL;  Surgeon: Wyline Mood, MD;  Location: [facility] ENDOSCOPY;  Service: Gastroenterology;  Laterality: N/A;   Patient Active Problem List   Diagnosis Date Noted   Breast cancer screening by mammogram 05/04/2023   Class 2 obesity 05/04/2023   Premature atrial contraction 02/28/2022   Hypokalemia 02/28/2022   Pruritus 11/03/2020   Hives 11/03/2020   Annual physical exam 08/02/2020   TMJ (temporomandibular joint disorder) 08/02/2020   Abnormal MRI, lumbar spine 08/02/2020   Lumbar radiculopathy 07/24/2020   Obesity (BMI 30-39.9) 07/18/2020    Left sided sciatica 07/18/2020   Prediabetes 07/18/2020   Osteoporosis 02/28/2018   Vitamin D deficiency 11/05/2017   Environmental and seasonal allergies 03/29/2017   Dermatitis 07/12/2016   Acute low back pain without sciatica 05/29/2016   Numbness and tingling in right hand 05/29/2016   Cardiac murmur 10/31/2015   Hyperlipidemia 06/14/2015   Essential hypertension 01/29/2011   ABNORMAL ELECTROCARDIOGRAM 01/29/2011    PCP: pt transferring providers.    REFERRING PROVIDER: Tressie Stalker, MD  REFERRING DIAG: ICD-10-CM Intervertebral disc disorders with radiculopathy, lumbar region   Rationale for Evaluation and Treatment: Rehabilitation  THERAPY DIAG:  Muscle weakness (generalized)  Difficulty in walking, not elsewhere classified  Low back pain radiating down leg  ONSET DATE: 11/30/2022  SUBJECTIVE:  SUBJECTIVE STATEMENT:  Patient reports her pain is getting worse. Physician won't see her until she is discharged from PT but will double check that     PERTINENT HISTORY:  62 year old black female nonsmoker on whom had a C6-7 anterior cervical diskectomy fusion plating on 02/05/2011. She did well after that surgery and have not seen her in years.The patient returns today with a new problem. She tells me she began having pain in her right leg all on 11/30/2022 without notable precipitating events. The patient was seen at [facility] by several doctors. She had a lumbar MRI. She then had 2 lumbar epidural injections which helped " a few days" . When her pain recurred she was told she had a pinched nerve. She scheduled appointment for my opinion.During today's visit the patient is accompanied by her husband and relates the above information. She complains of pain mainly in her right leg and what  sounds like the L4 distribution. She says her right leg/knee gives out. Her symptoms are present more less all the time but worse at night. Her symptoms do not significantly worsened with standing walking. She does not have any radicular symptoms on the left. She has been treated with various medications including a prednisone taper, gabapentin, oxycodone, muscle relaxants, etcetera. She has not had any physical therapy or chiropractic care. She tells me her neck is doing well.    PAIN:  Are you having pain? Yes: NPRS scale: 7/10 Pain location: lateral R leg  Pain description: nagging dull  ach. Can be burning or tingling.  Aggravating factors: going up/down stairs Relieving factors: none  PRECAUTIONS: None  WEIGHT BEARING RESTRICTIONS: No  FALLS:  Has patient fallen in last 6 months? 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   PLOF: Independent and Independent with basic ADLs  PATIENT GOALS: relief in back pain   NEXT MD VISIT: July 2024   OBJECTIVE:   DIAGNOSTIC FINDINGS:  MRI - R lateral disc bulge at L4-L5  PATIENT SURVEYS:  FOTO 58   SCREENING FOR RED FLAGS: Bowel or bladder incontinence: No Spinal tumors: No Cauda equina syndrome: No Compression fracture: No Abdominal aneurysm: No  COGNITION: Overall cognitive status: Within functional limits for tasks assessed     SENSATION: WFL  MUSCLE LENGTH: Hamstrings: Right decreased lacking 30 deg full extension at knee deg; Left WFL lacking ~15 deg full extension   POSTURE: rounded shoulders, forward head, and weight shift right  PALPATION: Multiple trigger point in R paraspinals and QL. Trigger points found in Piriformis and Glute med/max. Hypermobile in vertebrae T10-L5.   LUMBAR  ROM:   AROM eval  Flexion   Extension   Right lateral flexion   Left lateral flexion   Right rotation   Left rotation    (Blank rows = not tested)  LOWER EXTREMITY ROM:     Active  Right eval Left eval  Hip flexion Lacking ~ 15 deg  WFL  Hip extension Unable to lift beyond neutral in prone [facility]  Hip abduction    Hip adduction    Hip internal rotation    Hip external rotation    Knee flexion    Knee extension    Ankle dorsiflexion    Ankle plantarflexion    Ankle  inversion    Ankle eversion     (Blank rows = not tested)  LOWER EXTREMITY MMT:    MMT Right eval Left eval  Hip flexion 4- 4+  Hip extension 3 4-  Hip abduction 4- 5  Hip adduction 4 4+  Hip internal rotation    Hip external rotation    Knee flexion 4 5  Knee extension 4 5  Ankle dorsiflexion 4 5  Ankle plantarflexion 3+ 4+  Ankle inversion    Ankle eversion     (Blank rows = not tested)  LUMBAR SPECIAL TESTS:  Prone instability test: Positive, Straight leg raise test: Positive, Single leg stance test: TBD, SI Compression/distraction test: Negative, and FABER test: Positive  FUNCTIONAL TESTS:  5 times sit to stand: 13.22 with UE support 15.44 with no UE support  Timed up and go (TUG): 10.3  6 minute walk test: 1260ft. Mild increase in pain on the RLE 10 meter walk test: 10.9sec  Functional gait assessment: to be completed on next visit  GAIT: Distance walked: 40 Assistive device utilized: None Level of assistance: Complete Independence Comments: noted lateral cervical and trunk flexion to the R with decreased pelvic rotation Bil.   TODAY'S TREATMENT:                                                                                                                              DATE:   Single knee to chest 30 seconds each LE Hamstring stretch with sheet leg on PT shoulder 60 seconds Posterior pelvic tilt 10x 10 second holds  Bridging -terminated due to pain in back Transverse Ab contract with  Hip abd (GTB) 3 sec hold x 12 reps LTR x 20 with 3 sec hold  TRA into green swiss ball 10x 3 second holds DKC with Green theraball x 15 reps   Manual:    STM with implementation of effleurage and pettrisage to R quadratus lumborum and lumbar paraspinals. X 10 min.Noticeable decrease in spasm by end of STM.     PATIENT EDUCATION:  Education details: POC. Rehab potential. Pt educated throughout session about proper posture and technique with exercises. Improved exercise technique, movement at target joints, use of target muscles after min to mod verbal, visual, tactile cues.  Person educated: Patient Education method: Explanation, Demonstration, and Handouts Education comprehension: verbalized understanding and needs further education  HOME EXERCISE PROGRAM: Access Code: [email or web address] URL: [email or web address] Date: 05/14/2023 Prepared by: Maureen Ralphs  Exercises - Child's Pose with Sidebending  - 1 x daily - 7 x weekly - 3 sets - 20-30 hold - Standing Quadratus Lumborum Stretch with Doorway  - 1 x daily - 7 x weekly - 3 sets - 20-30 hold  Access Code: [email or web address] URL: [email or web address] Date: 05/06/2023 Prepared by: Maureen Ralphs  Exercises - Supine Posterior Pelvic Tilt  - 1 x daily - 7 x weekly - 3 sets - 10 reps - Clamshell  - 1 x daily - 7  x weekly - 3 sets - 10 reps     Access Code: [email or web address] URL: [email or web address] Date: 04/16/2023 Prepared by: Grier Rocher  Exercises - Supine Bridge  - 1 x daily - 7 x weekly - 3 sets - 10 reps - 2 hold - Supine Lower Trunk Rotation  - 1 x daily - 7 x weekly - 3 sets - 10 reps - 3 hold - Seated Piriformis Stretch with Trunk Bend  - 1 x daily - 7 x weekly - 3 sets - 5 reps - 15 hold  ASSESSMENT:  CLINICAL IMPRESSION:  Patient has significant tension of R lumbar paraspinals and quadratus lumborum that is relieved with STM. Patient does have a plateau of pain symptoms in  general between sessions, will benefit from return to physician. LE strengthening tolerated well this session however patient is unable to perform bridges due to pain. Patient continues to make progress with core stabilization will continue to benefit risk of physical therapy to improve her strength, and allow return to function and her prior level of function and improve her quality of life.    OBJECTIVE IMPAIRMENTS: Abnormal gait, decreased activity tolerance, decreased balance, decreased endurance, decreased knowledge of condition, decreased mobility, difficulty walking, decreased strength, increased fascial restrictions, impaired perceived functional ability, improper body mechanics, and postural dysfunction.   ACTIVITY LIMITATIONS: carrying, lifting, standing, squatting, stairs, bed mobility, and locomotion level  PARTICIPATION LIMITATIONS: laundry, community activity, and occupation  PERSONAL FACTORS: Age, Fitness, and Past/current experiences are also affecting patient's functional outcome.   REHAB POTENTIAL: Good  CLINICAL DECISION MAKING: Stable/uncomplicated  EVALUATION COMPLEXITY: Moderate   GOALS: Goals reviewed with patient? Yes   SHORT TERM GOALS: Target date: 05/28/2023    Patient will be independent in home exercise program to improve strength/mobility for better functional independence with ADLs. Baseline: 5/16: HEP compliant  Goal status: MET   LONG TERM GOALS: Target date: 07/09/2023    Patient will increase FOTO score to equal to or greater than 68   to demonstrate statistically significant improvement in mobility and quality of life.  Baseline: 54 Goal status: Ongoing   2.  Patient (> 35 years old) will complete five times sit to stand test in < 10 seconds indicating an increased LE strength and improved balance. Baseline: 15.4sec 5/16: 9.4 seconds Goal status: MET  3.  Patient will increase 6 min walk test  to >1500 ft points to demonstrate decreased fall  risk during functional activities Baseline: 1244ft. Mild increase in pain on the RLE 5/16: 1410  Goal status: IN PROGRESS  4.  Patient will increase 10 meter walk test to >1.11m/s as to improve gait speed for better community ambulation and to reduce fall risk. Baseline: 0.28m/s 5/1: 1.13 m/s  Goal status: MET  5.  Patient will increase FGA >26 as to demonstrate reduced fall risk and improved dynamic gait balance for better safety with community/home ambulation.  Baseline: to be completed 4/18: 17/30  5/16: 21 Goal status: IMET and progressed: New goal  6.  Patient will reduce pain to 0-1/10 with functional activities throughout day to demonstrate improved function and reduce impairment  Baseline:5/10 5/16: 5-6/10 6/10: 10/10  Goal status: On going    PLAN:  PT FREQUENCY: 1-2x/week  PT DURATION: 12 weeks  PLANNED INTERVENTIONS: Therapeutic exercises, Therapeutic activity, Neuromuscular re-education, Balance training, Gait training, Patient/Family education, Self Care, Joint mobilization, Joint manipulation, Stair training, Dry Needling, Electrical stimulation, Spinal manipulation, Spinal mobilization, Moist heat, Taping, Traction, and Manual therapy.  PLAN  FOR NEXT SESSION:   Continue with core stabilization exercises, manual therapy Pain modulation for R sided radicular pain, RLE strengthening    Precious Bard PT  Physical Therapist- [facility]  [facility]   8:44 AM 06/10/23

## 2023-06-13 ENCOUNTER — Ambulatory Visit: Payer: Commercial Managed Care - PPO | Admitting: Physical Therapy

## 2023-06-13 DIAGNOSIS — M79606 Pain in leg, unspecified: Secondary | ICD-10-CM | POA: Diagnosis not present

## 2023-06-13 DIAGNOSIS — R262 Difficulty in walking, not elsewhere classified: Secondary | ICD-10-CM

## 2023-06-13 DIAGNOSIS — M6281 Muscle weakness (generalized): Secondary | ICD-10-CM

## 2023-06-13 DIAGNOSIS — M545 Low back pain, unspecified: Secondary | ICD-10-CM | POA: Diagnosis not present

## 2023-06-13 NOTE — Therapy (Signed)
OUTPATIENT PHYSICAL THERAPY THORACOLUMBAR TREATMENT    Patient Name: Joyce Simpson MRN: [id number] DOB:[birth year], 62 y.o., female Today's Date: 06/13/2023  END OF SESSION:  PT End of Session - 06/13/23 0801     Visit Number 17    Number of Visits 24    Date for PT Re-Evaluation 07/09/23    Progress Note Due on Visit 20    PT Start Time 0804    Activity Tolerance Patient tolerated treatment well    Behavior During Therapy [facility] for tasks assessed/performed                         Past Medical History:  Diagnosis Date   COVID-19    12/19/19   Diverticulosis    Heart murmur    Hyperlipidemia    Hypertension    Migraines    Osteoporosis    Past Surgical History:  Procedure Laterality Date   ABDOMINAL HYSTERECTOMY     dub 2008/2009 h/o abnormal pap    BACK SURGERY     cervical spine [place] NS   CESAREAN SECTION     x2    COLONOSCOPY WITH PROPOFOL N/A 04/11/2018   Procedure: COLONOSCOPY WITH PROPOFOL;  Surgeon: Wyline Mood, MD;  Location: [facility] ENDOSCOPY;  Service: Gastroenterology;  Laterality: N/A;   COLONOSCOPY WITH PROPOFOL N/A 06/20/2018   Procedure: COLONOSCOPY WITH PROPOFOL;  Surgeon: Wyline Mood, MD;  Location: [facility] ENDOSCOPY;  Service: Gastroenterology;  Laterality: N/A;   COLONOSCOPY WITH PROPOFOL N/A 09/22/2021   Procedure: COLONOSCOPY WITH PROPOFOL;  Surgeon: Wyline Mood, MD;  Location: [facility] ENDOSCOPY;  Service: Gastroenterology;  Laterality: N/A;   Patient Active Problem List   Diagnosis Date Noted   Breast cancer screening by mammogram 05/04/2023   Class 2 obesity 05/04/2023   Premature atrial contraction 02/28/2022   Hypokalemia 02/28/2022   Pruritus 11/03/2020   Hives 11/03/2020   Annual physical exam 08/02/2020   TMJ (temporomandibular joint disorder) 08/02/2020   Abnormal MRI, lumbar spine 08/02/2020   Lumbar radiculopathy 07/24/2020   Obesity (BMI 30-39.9) 07/18/2020   Left sided sciatica 07/18/2020   Prediabetes 07/18/2020    Osteoporosis 02/28/2018   Vitamin D deficiency 11/05/2017   Environmental and seasonal allergies 03/29/2017   Dermatitis 07/12/2016   Acute low back pain without sciatica 05/29/2016   Numbness and tingling in right hand 05/29/2016   Cardiac murmur 10/31/2015   Hyperlipidemia 06/14/2015   Essential hypertension 01/29/2011   ABNORMAL ELECTROCARDIOGRAM 01/29/2011    PCP: pt transferring providers.    REFERRING PROVIDER: Tressie Stalker, MD  REFERRING DIAG: ICD-10-CM Intervertebral disc disorders with radiculopathy, lumbar region   Rationale for Evaluation and Treatment: Rehabilitation  THERAPY DIAG:  Muscle weakness (generalized)  Difficulty in walking, not elsewhere classified  Low back pain radiating down leg  ONSET DATE: 11/30/2022  SUBJECTIVE:  SUBJECTIVE STATEMENT:  Patient reports that he pain remains the same, 7/10 out of ten on this day. States radicular s/s into the R thigh.  Next MD appoint ment scheduled for 07/09/23.   PERTINENT HISTORY:   From recent MD visit " 62 year old black female nonsmoker on whom had a C6-7 anterior cervical diskectomy fusion plating on 02/05/2011. She did well after that surgery and have not seen her in years.The patient returns today with a new problem. She tells me she began having pain in her right leg all on 11/30/2022 without notable precipitating events. The patient was seen at [facility] by several doctors. She had a lumbar MRI. She then had 2 lumbar epidural injections which helped " a few days" . When her pain recurred she was told she had a pinched nerve. She scheduled appointment for my opinion.During today's visit the patient is accompanied by her husband and relates the above information. She complains of pain mainly in her right leg and what sounds  like the L4 distribution. She says her right leg/knee gives out. Her symptoms are present more less all the time but worse at night. Her symptoms do not significantly worsened with standing walking. She does not have any radicular symptoms on the left. She has been treated with various medications including a prednisone taper, gabapentin, oxycodone, muscle relaxants, etcetera. She has not had any physical therapy or chiropractic care. She tells me her neck is doing well.  "  PAIN:  Are you having pain? Yes: NPRS scale: 7/10 Pain location: lateral R leg  Pain description: nagging dull  ach. Can be burning or tingling.  Aggravating factors: going up/down stairs Relieving factors: none  PRECAUTIONS: None  WEIGHT BEARING RESTRICTIONS: No  FALLS:  Has patient fallen in last 6 months? 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   PLOF: Independent and Independent with basic ADLs  PATIENT GOALS: relief in back pain   NEXT MD VISIT: July 2024   OBJECTIVE:   DIAGNOSTIC FINDINGS:  MRI - R lateral disc bulge at L4-L5  PATIENT SURVEYS:  FOTO 58   SCREENING FOR RED FLAGS: Bowel or bladder incontinence: No Spinal tumors: No Cauda equina syndrome: No Compression fracture: No Abdominal aneurysm: No  COGNITION: Overall cognitive status: Within functional limits for tasks assessed     SENSATION: WFL  MUSCLE LENGTH: Hamstrings: Right decreased lacking 30 deg full extension at knee deg; Left WFL lacking ~15 deg full extension   POSTURE: rounded shoulders, forward head, and weight shift right  PALPATION: Multiple trigger point in R paraspinals and QL. Trigger points found in Piriformis and Glute med/max. Hypermobile in vertebrae T10-L5.   LUMBAR ROM:    AROM eval  Flexion   Extension   Right lateral flexion   Left lateral flexion   Right rotation   Left rotation    (Blank rows = not tested)  LOWER EXTREMITY ROM:     Active  Right eval Left eval  Hip flexion Lacking ~ 15 deg  WFL  Hip extension Unable to lift beyond neutral in prone [facility]  Hip abduction    Hip adduction    Hip internal rotation    Hip external rotation    Knee flexion    Knee extension  Ankle dorsiflexion    Ankle plantarflexion    Ankle inversion    Ankle eversion     (Blank rows = not tested)  LOWER EXTREMITY MMT:    MMT Right eval Left eval  Hip flexion 4- 4+  Hip extension 3 4-  Hip abduction 4- 5  Hip adduction 4 4+  Hip internal rotation    Hip external rotation    Knee flexion 4 5  Knee extension 4 5  Ankle dorsiflexion 4 5  Ankle plantarflexion 3+ 4+  Ankle inversion    Ankle eversion     (Blank rows = not tested)  LUMBAR SPECIAL TESTS:  Prone instability test: Positive, Straight leg raise test: Positive, Single leg stance test: TBD, SI Compression/distraction test: Negative, and FABER test: Positive  FUNCTIONAL TESTS:  5 times sit to stand: 13.22 with UE support 15.44 with no UE support  Timed up and go (TUG): 10.3  6 minute walk test: 1246ft. Mild increase in pain on the RLE 10 meter walk test: 10.9sec  Functional gait assessment: to be completed on next visit  GAIT: Distance walked: 40 Assistive device utilized: None Level of assistance: Complete Independence Comments: noted lateral cervical and trunk flexion to the R with decreased pelvic rotation Bil.   TODAY'S TREATMENT:                                                                                                                              DATE:   Single knee to chest 2 x 40 seconds each LE Posterior pelvic tilt 2x 15 with 3 sec holds  LTR x 20 with 3 sec hold  Sustained hip flexor quad stretch off edge of bed 2 x 40 seconds bilateral. Straight leg raise x 5  bilateral cues for deep core transverse abdominis activation.  Bridge with lateral scoot x 4 bilateral.  Manual:    STM with implementation of effleurage and pettrisage to R quadratus lumborum and lumbar paraspinals. X 10 min.   Hamstring stretch with leg on PT shoulder 2 x 45 seconds bilateral with contract relax at 20 seconds with 5-second hold. Noticeable decrease in spasm by end of STM.    PATIENT EDUCATION:  Education details: POC. Rehab potential. Pt educated throughout session about proper posture and technique with exercises. Improved exercise technique, movement at target joints, use of target muscles after min to mod verbal, visual, tactile cues.  Person educated: Patient Education method: Explanation, Demonstration, and Handouts Education comprehension: verbalized understanding and needs further education  HOME EXERCISE PROGRAM: Access Code: [email or web address] URL: [email or web address] Date: 05/14/2023 Prepared by: Maureen Ralphs  Exercises - Child's Pose with Sidebending  - 1 x daily - 7 x weekly - 3 sets - 20-30 hold - Standing Quadratus Lumborum Stretch with Doorway  - 1 x daily - 7 x weekly - 3 sets - 20-30 hold  Access Code: [email or web address] URL: [email or web address] Date: 05/06/2023 Prepared by: Maureen Ralphs  Exercises - Supine Posterior  Pelvic Tilt  - 1 x daily - 7 x weekly - 3 sets - 10 reps - Clamshell  - 1 x daily - 7 x weekly - 3 sets - 10 reps     Access Code: [email or web address] URL: [email or web address] Date: 04/16/2023 Prepared by: Grier Rocher  Exercises - Supine Bridge  - 1 x daily - 7 x weekly - 3 sets - 10 reps - 2 hold - Supine Lower Trunk Rotation  - 1 x daily - 7 x weekly - 3 sets - 10 reps - 3 hold - Seated Piriformis Stretch with Trunk Bend  - 1 x daily - 7 x weekly - 3 sets - 5 reps - 15 hold  ASSESSMENT:  CLINICAL IMPRESSION:  Patient patient put forth good effort and therapeutic intervention this  day.  Continues to state only minimal change between therapy sessions.  Able to demonstrate improved activation of core with cues from PT.  Mild reduction in  low back pain and stiffness at end of PT session.  Patient reports no reduction in radicular signs and symptoms.  Will benefit from return to physician.  Patient continues to make progress with core stabilization will continue to benefit risk of physical therapy to improve her strength, and allow return to function and her prior level of function and improve her quality of life.    OBJECTIVE IMPAIRMENTS: Abnormal gait, decreased activity tolerance, decreased balance, decreased endurance, decreased knowledge of condition, decreased mobility, difficulty walking, decreased strength, increased fascial restrictions, impaired perceived functional ability, improper body mechanics, and postural dysfunction.   ACTIVITY LIMITATIONS: carrying, lifting, standing, squatting, stairs, bed mobility, and locomotion level  PARTICIPATION LIMITATIONS: laundry, community activity, and occupation  PERSONAL FACTORS: Age, Fitness, and Past/current experiences are also affecting patient's functional outcome.   REHAB POTENTIAL: Good  CLINICAL DECISION MAKING: Stable/uncomplicated  EVALUATION COMPLEXITY: Moderate   GOALS: Goals reviewed with patient? Yes   SHORT TERM GOALS: Target date: 05/28/2023    Patient will be independent in home exercise program to improve strength/mobility for better functional independence with ADLs. Baseline: 5/16: HEP compliant  Goal status: MET   LONG TERM GOALS: Target date: 07/09/2023    Patient will increase FOTO score to equal to or greater than 68   to demonstrate statistically significant improvement in mobility and quality of life.  Baseline: 54 Goal status: Ongoing   2.  Patient (> 81 years old) will complete five times sit to stand test in < 10 seconds indicating an increased LE strength and improved  balance. Baseline: 15.4sec 5/16: 9.4 seconds Goal status: MET  3.  Patient will increase 6 min walk test  to >1500 ft points to demonstrate decreased fall risk during functional activities Baseline: 1271ft. Mild increase in pain on the RLE 5/16: 1410  Goal status: IN PROGRESS  4.  Patient will increase 10 meter walk test to >1.42m/s as to improve gait speed for better community ambulation and to reduce fall risk. Baseline: 0.67m/s 5/1: 1.13 m/s  Goal status: MET  5.  Patient will increase FGA >26 as to demonstrate reduced fall risk and improved dynamic gait balance for better safety with community/home ambulation.  Baseline: to be completed 4/18: 17/30  5/16: 21 Goal status: IMET and progressed: New goal  6.  Patient will reduce pain to 0-1/10 with functional activities throughout day to demonstrate improved function and reduce impairment  Baseline:5/10 5/16: 5-6/10 6/10: 10/10  Goal status: On going    PLAN:  PT FREQUENCY: 1-2x/week  PT DURATION:  12 weeks  PLANNED INTERVENTIONS: Therapeutic exercises, Therapeutic activity, Neuromuscular re-education, Balance training, Gait training, Patient/Family education, Self Care, Joint mobilization, Joint manipulation, Stair training, Dry Needling, Electrical stimulation, Spinal manipulation, Spinal mobilization, Moist heat, Taping, Traction, and Manual therapy.  PLAN FOR NEXT SESSION:   Continue with core stabilization exercises,  manual therapy Pain modulation for R sided radicular pain,  RLE strengthening    Golden Pop PT  Physical Therapist- [place]  [facility]   8:02 AM 06/13/23   Note: Portions of this document were prepared using Dragon voice recognition software and although reviewed may contain unintentional dictation errors in syntax, grammar, or spelling.

## 2023-06-15 ENCOUNTER — Encounter: Payer: Self-pay | Admitting: Family Medicine

## 2023-06-15 NOTE — Assessment & Plan Note (Signed)
Chronic.  Recent LDL elevated not at goal. Restart Pravachol 20 mg daily

## 2023-06-15 NOTE — Assessment & Plan Note (Signed)
Chronic.  Prolia injection now approved Labs up-to-date and within normal limits.  Recent DEXA 10/23, T-score -1.9. Prolia injection administered by CMA today Continue calcium and vitamin D supplement Follow-up in 6 months for repeat injection

## 2023-06-15 NOTE — Assessment & Plan Note (Signed)
Chronic. Start vitamin D 1.25 mg weekly  

## 2023-06-15 NOTE — Assessment & Plan Note (Signed)
Chronic.  Stable.  Has been following with physical therapy and doing well. Plan for surgical intervention in July 2024. Follows with Dr. Lovell Sheehan at [facility] neurosurgery in [place].

## 2023-06-15 NOTE — Assessment & Plan Note (Signed)
Chronic.  Stable.  Well-controlled on current medications. Continue Hyzaar 50-12.5 mg daily Continue Amlodipine 5 mg daily Monitor BP at home.  Goal less than 140/90 per JNC 8 guidelines

## 2023-06-17 ENCOUNTER — Ambulatory Visit: Payer: Commercial Managed Care - PPO

## 2023-06-17 DIAGNOSIS — M545 Low back pain, unspecified: Secondary | ICD-10-CM

## 2023-06-17 DIAGNOSIS — R262 Difficulty in walking, not elsewhere classified: Secondary | ICD-10-CM

## 2023-06-17 DIAGNOSIS — M79606 Pain in leg, unspecified: Secondary | ICD-10-CM

## 2023-06-17 DIAGNOSIS — M6281 Muscle weakness (generalized): Secondary | ICD-10-CM | POA: Diagnosis not present

## 2023-06-17 NOTE — Therapy (Addendum)
OUTPATIENT PHYSICAL THERAPY THORACOLUMBAR TREATMENT    Patient Name: Joyce Simpson MRN: [id number] DOB:Jan 09, 1961, 62 y.o., female Today's Date: 06/17/2023  END OF SESSION:  PT End of Session - 06/17/23 0804     Visit Number 18    Number of Visits 24    Date for PT Re-Evaluation 07/09/23    Progress Note Due on Visit 20    PT Start Time 0804    PT Stop Time 0845    PT Time Calculation (min) 41 min    Activity Tolerance Patient tolerated treatment well    Behavior During Therapy [facility] for tasks assessed/performed                         Past Medical History:  Diagnosis Date   COVID-19    12/19/19   Diverticulosis    Heart murmur    Hyperlipidemia    Hypertension    Migraines    Osteoporosis    Past Surgical History:  Procedure Laterality Date   ABDOMINAL HYSTERECTOMY     dub 2008/2009 h/o abnormal pap    BACK SURGERY     cervical spine [place]   CESAREAN SECTION     x2    COLONOSCOPY WITH PROPOFOL N/A 04/11/2018   Procedure: COLONOSCOPY WITH PROPOFOL;  Surgeon: Wyline Mood, MD;  Location: [facility] ENDOSCOPY;  Service: Gastroenterology;  Laterality: N/A;   COLONOSCOPY WITH PROPOFOL N/A 06/20/2018   Procedure: COLONOSCOPY WITH PROPOFOL;  Surgeon: Wyline Mood, MD;  Location: [facility] ENDOSCOPY;  Service: Gastroenterology;  Laterality: N/A;   COLONOSCOPY WITH PROPOFOL N/A 09/22/2021   Procedure: COLONOSCOPY WITH PROPOFOL;  Surgeon: Wyline Mood, MD;  Location: [facility] ENDOSCOPY;  Service: Gastroenterology;  Laterality: N/A;   Patient Active Problem List   Diagnosis Date Noted   Breast cancer screening by mammogram 05/04/2023   Class 2 obesity 05/04/2023   Premature atrial contraction 02/28/2022   Hypokalemia 02/28/2022   Pruritus 11/03/2020   Hives 11/03/2020   Annual physical exam 08/02/2020   TMJ (temporomandibular joint disorder) 08/02/2020   Abnormal MRI, lumbar spine 08/02/2020   Lumbar radiculopathy 07/24/2020   Obesity (BMI 30-39.9) 07/18/2020    Left sided sciatica 07/18/2020   Prediabetes 07/18/2020   Osteoporosis 02/28/2018   Vitamin D deficiency 11/05/2017   Environmental and seasonal allergies 03/29/2017   Dermatitis 07/12/2016   Acute low back pain without sciatica 05/29/2016   Numbness and tingling in right hand 05/29/2016   Cardiac murmur 10/31/2015   Hyperlipidemia 06/14/2015   Essential hypertension 01/29/2011   ABNORMAL ELECTROCARDIOGRAM 01/29/2011    PCP: pt transferring providers.    REFERRING PROVIDER: Tressie Stalker, MD  REFERRING DIAG: ICD-10-CM Intervertebral disc disorders with radiculopathy, lumbar region   Rationale for Evaluation and Treatment: Rehabilitation  THERAPY DIAG:  Muscle weakness (generalized)  Difficulty in walking, not elsewhere classified  Low back pain radiating down leg  ONSET DATE: 11/30/2022  SUBJECTIVE:  SUBJECTIVE STATEMENT:  Pt reports she fell yesterday walking in [place] parking [place].  Pt reports her R LE is still hurting at this point in time.  8/10 pain reported   PERTINENT HISTORY:   From recent MD visit " 62 year old black female nonsmoker on whom had a C6-7 anterior cervical diskectomy fusion plating on 02/05/2011. She did well after that surgery and have not seen her in years.The patient returns today with a new problem. She tells me she began having pain in her right leg all on 11/30/2022 without notable precipitating events. The patient was seen at [facility] by several doctors. She had a lumbar MRI. She then had 2 lumbar epidural injections which helped " a few days" . When her pain recurred she was told she had a pinched nerve. She scheduled appointment for my opinion.During today's visit the patient is accompanied by her husband and relates the above information. She complains  of pain mainly in her right leg and what sounds like the L4 distribution. She says her right leg/knee gives out. Her symptoms are present more less all the time but worse at night. Her symptoms do not significantly worsened with standing walking. She does not have any radicular symptoms on the left. She has been treated with various medications including a prednisone taper, gabapentin, oxycodone, muscle relaxants, etcetera. She has not had any physical therapy or chiropractic care. She tells me her neck is doing well.  "  PAIN:  Are you having pain? Yes: NPRS scale: 8/10 Pain location: lateral R leg  Pain description: nagging dull  ach. Can be burning or tingling.  Aggravating factors: going up/down stairs Relieving factors: none  PRECAUTIONS: None  WEIGHT BEARING RESTRICTIONS: No  FALLS:  Has patient fallen in last 6 months? 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   PLOF: Independent and Independent with basic ADLs  PATIENT GOALS: relief in back pain   NEXT MD VISIT: July 2024   OBJECTIVE:   DIAGNOSTIC FINDINGS:  MRI - R lateral disc bulge at L4-L5  PATIENT SURVEYS:  FOTO 58   SCREENING FOR RED FLAGS: Bowel or bladder incontinence: No Spinal tumors: No Cauda equina syndrome: No Compression fracture: No Abdominal aneurysm: No  COGNITION: Overall cognitive status: Within functional limits for tasks assessed     SENSATION: WFL  MUSCLE LENGTH: Hamstrings: Right decreased lacking 30 deg full extension at knee deg; Left WFL lacking ~15 deg full extension   POSTURE: rounded shoulders, forward head, and weight shift right  PALPATION: Multiple trigger point in R paraspinals and QL. Trigger points found in Piriformis and Glute med/max.  Hypermobile in vertebrae T10-L5.   LUMBAR ROM:   AROM eval  Flexion   Extension   Right lateral flexion   Left lateral flexion   Right rotation   Left rotation    (Blank rows = not tested)  LOWER EXTREMITY ROM:     Active  Right eval Left eval  Hip flexion Lacking ~ 15 deg  WFL  Hip extension Unable to lift beyond neutral in prone [facility]  Hip abduction    Hip adduction    Hip internal rotation    Hip external rotation    Knee flexion    Knee extension  Ankle dorsiflexion    Ankle plantarflexion    Ankle inversion    Ankle eversion     (Blank rows = not tested)  LOWER EXTREMITY MMT:    MMT Right eval Left eval  Hip flexion 4- 4+  Hip extension 3 4-  Hip abduction 4- 5  Hip adduction 4 4+  Hip internal rotation    Hip external rotation    Knee flexion 4 5  Knee extension 4 5  Ankle dorsiflexion 4 5  Ankle plantarflexion 3+ 4+  Ankle inversion    Ankle eversion     (Blank rows = not tested)  LUMBAR SPECIAL TESTS:  Prone instability test: Positive, Straight leg raise test: Positive, Single leg stance test: TBD, SI Compression/distraction test: Negative, and FABER test: Positive  FUNCTIONAL TESTS:  5 times sit to stand: 13.22 with UE support 15.44 with no UE support  Timed up and go (TUG): 10.3  6 minute walk test: 121ft. Mild increase in pain on the RLE 10 meter walk test: 10.9sec  Functional gait assessment: to be completed on next visit  GAIT: Distance walked: 40 Assistive device utilized: None Level of assistance: Complete Independence Comments: noted lateral cervical and trunk flexion to the R with decreased pelvic rotation Bil.   TODAY'S TREATMENT: DATE: 06/17/23  TherEx:  Supine B hamstring stretch, SLR, 30 sec bouts Supine B hamstring stretch, bent knee, 30 sec bouts Hooklying B figure 4 stretch, 30 sec bouts Hooklying B piriformis stretch, 30 sec bouts Supine ITB stretch with belt, 30 sec bouts x2 Prone quad stretch with belt, 30 sec  bouts x2 Supine lower trunk rotations, 3-4 sec holds, x20 each side Supine SKTC, 30 sec holds, x2 each LE Hooklying bridging with glute squeeze prior to lift-off, 2x10 Hooklying hip adduction squeeze into physioball, 3 sec holds, 2x10 Hooklying hip abduction into green TB, 2x10 each      PATIENT EDUCATION:  Education details: POC. Rehab potential. Pt educated throughout session about proper posture and technique with exercises. Improved exercise technique, movement at target joints, use of target muscles after min to mod verbal, visual, tactile cues.  Person educated: Patient Education method: Explanation, Demonstration, and Handouts Education comprehension: verbalized understanding and needs further education  HOME EXERCISE PROGRAM: Access Code: [email or web address] URL: [email or web address] Date: 05/14/2023 Prepared by: Maureen Ralphs  Exercises - Child's Pose with Sidebending  - 1 x daily - 7 x weekly - 3 sets - 20-30 hold - Standing Quadratus Lumborum Stretch with Doorway  - 1 x daily - 7 x weekly - 3 sets - 20-30 hold  Access Code: [email or web address] URL: [email or web address] Date: 05/06/2023 Prepared by: Maureen Ralphs  Exercises - Supine Posterior Pelvic Tilt  - 1 x daily - 7 x weekly - 3 sets - 10 reps - Clamshell  - 1 x daily - 7 x weekly - 3 sets - 10 reps     Access Code: [email or web address] URL: [email or web address] Date: 04/16/2023 Prepared by: Grier Rocher  Exercises - Supine Bridge  - 1 x daily - 7 x weekly - 3 sets - 10 reps - 2 hold - Supine Lower Trunk Rotation  - 1 x daily - 7 x weekly - 3 sets - 10 reps - 3 hold - Seated Piriformis Stretch with Trunk Bend  - 1 x daily - 7 x weekly - 3 sets - 5 reps - 15 hold  ASSESSMENT:  CLINICAL IMPRESSION:  Pt responded well to the therapeutic exercises, noting a reduction in her pain levels upon leaving  the clinic.  Pt noted increased pain reported in the ITB and quads of the R LE upon  arrival today, so more time was spent on exercises that targeted those musculature regions.  Pt given exercises that pt can replicate at home.   Pt will continue to benefit from skilled therapy to address remaining deficits in order to improve overall QoL and return to PLOF.       OBJECTIVE IMPAIRMENTS: Abnormal gait, decreased activity tolerance, decreased balance, decreased endurance, decreased knowledge of condition, decreased mobility, difficulty walking, decreased strength, increased fascial restrictions, impaired perceived functional ability, improper body mechanics, and postural dysfunction.   ACTIVITY LIMITATIONS: carrying, lifting, standing, squatting, stairs, bed mobility, and locomotion level  PARTICIPATION LIMITATIONS: laundry, community activity, and occupation  PERSONAL FACTORS: Age, Fitness, and Past/current experiences are also affecting patient's functional outcome.   REHAB POTENTIAL: Good  CLINICAL DECISION MAKING: Stable/uncomplicated  EVALUATION COMPLEXITY: Moderate   GOALS: Goals reviewed with patient? Yes   SHORT TERM GOALS: Target date: 05/28/2023    Patient will be independent in home exercise program to improve strength/mobility for better functional independence with ADLs. Baseline: 5/16: HEP compliant  Goal status: MET   LONG TERM GOALS: Target date: 07/09/2023    Patient will increase FOTO score to equal to or greater than 68   to demonstrate statistically significant improvement in mobility and quality of life.  Baseline: 54 Goal status: Ongoing   2.  Patient (> 60 years old) will complete five times sit to stand test in < 10 seconds indicating an increased LE strength and improved balance. Baseline: 15.4sec 5/16: 9.4 seconds Goal status: MET  3.  Patient will increase 6 min walk test  to >1500 ft points to demonstrate decreased fall risk during functional activities Baseline: 1239ft. Mild increase in pain on the RLE 5/16: 1410  Goal status: IN  PROGRESS  4.  Patient will increase 10 meter walk test to >1.80m/s as to improve gait speed for better community ambulation and to reduce fall risk. Baseline: 0.75m/s 5/1: 1.13 m/s  Goal status: MET  5.  Patient will increase FGA >26 as to demonstrate reduced fall risk and improved dynamic gait balance for better safety with community/home ambulation.  Baseline: to be completed 4/18: 17/30  5/16: 21 Goal status: IMET and progressed: New goal  6.  Patient will reduce pain to 0-1/10 with functional activities throughout day to demonstrate improved function and reduce impairment  Baseline:5/10 5/16: 5-6/10 6/10: 10/10  Goal status: On going    PLAN:  PT FREQUENCY: 1-2x/week  PT DURATION: 12 weeks  PLANNED INTERVENTIONS: Therapeutic exercises, Therapeutic activity, Neuromuscular re-education, Balance training, Gait training, Patient/Family education, Self Care, Joint mobilization, Joint manipulation, Stair training, Dry Needling, Electrical stimulation, Spinal manipulation, Spinal mobilization, Moist heat, Taping, Traction, and Manual therapy.  PLAN FOR NEXT SESSION:   Continue with core stabilization exercises,  manual therapy Pain modulation for R sided radicular pain,  RLE strengthening     Nolon Bussing, PT, DPT Physical Therapist - [facility]  06/17/23, 1:38 PM

## 2023-06-20 ENCOUNTER — Ambulatory Visit: Payer: Commercial Managed Care - PPO

## 2023-06-20 DIAGNOSIS — M545 Low back pain, unspecified: Secondary | ICD-10-CM | POA: Diagnosis not present

## 2023-06-20 DIAGNOSIS — M79606 Pain in leg, unspecified: Secondary | ICD-10-CM

## 2023-06-20 DIAGNOSIS — R262 Difficulty in walking, not elsewhere classified: Secondary | ICD-10-CM | POA: Diagnosis not present

## 2023-06-20 DIAGNOSIS — M6281 Muscle weakness (generalized): Secondary | ICD-10-CM

## 2023-06-20 NOTE — Therapy (Signed)
OUTPATIENT PHYSICAL THERAPY THORACOLUMBAR TREATMENT    Patient Name: Joyce Simpson MRN: [id number] DOB:1961/10/06, 62 y.o., female Today's Date: 06/21/2023  END OF SESSION:  PT End of Session - 06/20/23 0901     Visit Number 19    Number of Visits 24    Date for PT Re-Evaluation 07/09/23    Progress Note Due on Visit 20    PT Start Time 0800    PT Stop Time 0844    PT Time Calculation (min) 44 min    Activity Tolerance Patient tolerated treatment well    Behavior During Therapy [facility] for tasks assessed/performed                          Past Medical History:  Diagnosis Date   COVID-19    12/19/19   Diverticulosis    Heart murmur    Hyperlipidemia    Hypertension    Migraines    Osteoporosis    Past Surgical History:  Procedure Laterality Date   ABDOMINAL HYSTERECTOMY     dub 2008/2009 h/o abnormal pap    BACK SURGERY     cervical spine [place] NS   CESAREAN SECTION     x2    COLONOSCOPY WITH PROPOFOL N/A 04/11/2018   Procedure: COLONOSCOPY WITH PROPOFOL;  Surgeon: Wyline Mood, MD;  Location: [facility] ENDOSCOPY;  Service: Gastroenterology;  Laterality: N/A;   COLONOSCOPY WITH PROPOFOL N/A 06/20/2018   Procedure: COLONOSCOPY WITH PROPOFOL;  Surgeon: Wyline Mood, MD;  Location: [facility] ENDOSCOPY;  Service: Gastroenterology;  Laterality: N/A;   COLONOSCOPY WITH PROPOFOL N/A 09/22/2021   Procedure: COLONOSCOPY WITH PROPOFOL;  Surgeon: Wyline Mood, MD;  Location: [facility] ENDOSCOPY;  Service: Gastroenterology;  Laterality: N/A;   Patient Active Problem List   Diagnosis Date Noted   Breast cancer screening by mammogram 05/04/2023   Class 2 obesity 05/04/2023   Premature atrial contraction 02/28/2022   Hypokalemia 02/28/2022   Pruritus 11/03/2020   Hives 11/03/2020   Annual physical exam 08/02/2020   TMJ (temporomandibular joint disorder) 08/02/2020   Abnormal MRI, lumbar spine 08/02/2020   Lumbar radiculopathy 07/24/2020   Obesity (BMI 30-39.9) 07/18/2020    Left sided sciatica 07/18/2020   Prediabetes 07/18/2020   Osteoporosis 02/28/2018   Vitamin D deficiency 11/05/2017   Environmental and seasonal allergies 03/29/2017   Dermatitis 07/12/2016   Acute low back pain without sciatica 05/29/2016   Numbness and tingling in right hand 05/29/2016   Cardiac murmur 10/31/2015   Hyperlipidemia 06/14/2015   Essential hypertension 01/29/2011   ABNORMAL ELECTROCARDIOGRAM 01/29/2011    PCP: pt transferring providers.    REFERRING PROVIDER: Tressie Stalker, MD  REFERRING DIAG: ICD-10-CM Intervertebral disc disorders with radiculopathy, lumbar region   Rationale for Evaluation and Treatment: Rehabilitation  THERAPY DIAG:  Muscle weakness (generalized)  Difficulty in walking, not elsewhere classified  Low back pain radiating down leg  ONSET DATE: 11/30/2022  SUBJECTIVE:  SUBJECTIVE STATEMENT:  Pt reports still very sore from parking lot fall at [employer].  Pt continues to endorse 8/10 right LE pain.   PERTINENT HISTORY:   From recent MD visit " 62 year old black female nonsmoker on whom had a C6-7 anterior cervical diskectomy fusion plating on 02/05/2011. She did well after that surgery and have not seen her in years.The patient returns today with a new problem. She tells me she began having pain in her right leg all on 11/30/2022 without notable precipitating events. The patient was seen at [facility] by several doctors. She had a lumbar MRI. She then had 2 lumbar epidural injections which helped " a few days" . When her pain recurred she was told she had a pinched nerve. She scheduled appointment for my opinion.During today's visit the patient is accompanied by her husband and relates the above information. She complains of pain mainly in her right leg and what  sounds like the L4 distribution. She says her right leg/knee gives out. Her symptoms are present more less all the time but worse at night. Her symptoms do not significantly worsened with standing walking. She does not have any radicular symptoms on the left. She has been treated with various medications including a prednisone taper, gabapentin, oxycodone, muscle relaxants, etcetera. She has not had any physical therapy or chiropractic care. She tells me her neck is doing well.  "  PAIN:  Are you having pain? Yes: NPRS scale: 8/10 Pain location: lateral R leg  Pain description: nagging dull  ach. Can be burning or tingling.  Aggravating factors: going up/down stairs Relieving factors: none  PRECAUTIONS: None  WEIGHT BEARING RESTRICTIONS: No  FALLS:  Has patient fallen in last 6 months? 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   PLOF: Independent and Independent with basic ADLs  PATIENT GOALS: relief in back pain   NEXT MD VISIT: July 2024   OBJECTIVE:   DIAGNOSTIC FINDINGS:  MRI - R lateral disc bulge at L4-L5  PATIENT SURVEYS:  FOTO 58   SCREENING FOR RED FLAGS: Bowel or bladder incontinence: No Spinal tumors: No Cauda equina syndrome: No Compression fracture: No Abdominal aneurysm: No  COGNITION: Overall cognitive status: Within functional limits for tasks assessed     SENSATION: WFL  MUSCLE LENGTH: Hamstrings: Right decreased lacking 30 deg full extension at knee deg; Left WFL lacking ~15 deg full extension   POSTURE: rounded shoulders, forward head, and weight shift right  PALPATION: Multiple trigger point in R paraspinals and QL. Trigger points found in Piriformis and Glute med/max. Hypermobile in vertebrae T10-L5.   LUMBAR  ROM:   AROM eval  Flexion   Extension   Right lateral flexion   Left lateral flexion   Right rotation   Left rotation    (Blank rows = not tested)  LOWER EXTREMITY ROM:     Active  Right eval Left eval  Hip flexion Lacking ~ 15 deg  WFL  Hip extension Unable to lift beyond neutral in prone [facility]  Hip abduction    Hip adduction    Hip internal rotation    Hip external rotation    Knee flexion    Knee extension    Ankle dorsiflexion    Ankle plantarflexion  Ankle inversion    Ankle eversion     (Blank rows = not tested)  LOWER EXTREMITY MMT:    MMT Right eval Left eval  Hip flexion 4- 4+  Hip extension 3 4-  Hip abduction 4- 5  Hip adduction 4 4+  Hip internal rotation    Hip external rotation    Knee flexion 4 5  Knee extension 4 5  Ankle dorsiflexion 4 5  Ankle plantarflexion 3+ 4+  Ankle inversion    Ankle eversion     (Blank rows = not tested)  LUMBAR SPECIAL TESTS:  Prone instability test: Positive, Straight leg raise test: Positive, Single leg stance test: TBD, SI Compression/distraction test: Negative, and FABER test: Positive  FUNCTIONAL TESTS:  5 times sit to stand: 13.22 with UE support 15.44 with no UE support  Timed up and go (TUG): 10.3  6 minute walk test: 1219ft. Mild increase in pain on the RLE 10 meter walk test: 10.9sec  Functional gait assessment: to be completed on next visit  GAIT: Distance walked: 40 Assistive device utilized: None Level of assistance: Complete Independence Comments: noted lateral cervical and trunk flexion to the R with decreased pelvic rotation Bil.   TODAY'S TREATMENT: DATE: 06/21/23  TherEx: Supine  SKTC with overpressure, 30 sec holds, x2 each LE Supine Lower trunk rotation hold 30 sec x 3 Supine B hamstring stretch, SLR, 30 sec bouts Supine B hamstring stretch, bent knee, 30 sec bouts  figure 4 stretch, 30 sec bouts Hooklying B piriformis stretch, 30 sec hold x 3 Prone quad stretch manual, 30 sec  bouts x2 Prone press up x 10 reps Prone hip ext each LE x 10 reps Supine lower trunk rotations, 3-4 sec holds, x20 each side Hooklying bridging with glute squeeze prior to lift-off, x10 Hooklying hip abduction RTB, 2x10 each   Manual Long axis Distraction RLE - sustained hold x 1 min x 3 STM with rolling stick to right lateral quad/IT band/Hamstrings x 7 min    PATIENT EDUCATION:  Education details: POC. Rehab potential. Pt educated throughout session about proper posture and technique with exercises. Improved exercise technique, movement at target joints, use of target muscles after min to mod verbal, visual, tactile cues.  Person educated: Patient Education method: Explanation, Demonstration, and Handouts Education comprehension: verbalized understanding and needs further education  HOME EXERCISE PROGRAM: Access Code: [email or web address] URL: [email or web address] Date: 06/20/2023 Prepared by: Maureen Ralphs  Exercises - Seated Lower Limb Slump Neural Mobilization  - 1 x daily - 7 x weekly - 3 sets - 10 reps      Access Code: [email or web address] URL: [email or web address] Date: 05/14/2023 Prepared by: Maureen Ralphs  Exercises - Child's Pose with Sidebending  - 1 x daily - 7 x weekly - 3 sets - 20-30 hold - Standing Quadratus Lumborum Stretch with Doorway  - 1 x daily - 7 x weekly - 3 sets - 20-30 hold  Access Code: [email or web address] URL: [email or web address] Date: 05/06/2023 Prepared by: Maureen Ralphs  Exercises - Supine Posterior Pelvic Tilt  - 1 x daily - 7 x weekly - 3 sets - 10 reps - Clamshell  - 1 x daily - 7 x weekly - 3 sets - 10 reps     Access Code: [email or web address] URL: [email or web address] Date: 04/16/2023 Prepared by: Grier Rocher  Exercises - Supine Bridge  - 1 x daily - 7 x weekly - 3 sets - 10 reps - 2 hold - Supine Lower Trunk Rotation  - 1 x daily -  7 x weekly - 3 sets - 10 reps - 3 hold - Seated  Piriformis Stretch with Trunk Bend  - 1 x daily - 7 x weekly - 3 sets - 5 reps - 15 hold  ASSESSMENT:  CLINICAL IMPRESSION:  Patient presents with good motivation despite ongoing more acute pain from recent fall. She responded well both subjectively stating feeling better after treatment and as seen by ability to complete ROM and strengthening exercises. She was able to progress to more lumbar ext strengthening without c/o of increased Right LE pain.   Pt will continue to benefit from skilled therapy to address remaining deficits in order to improve overall QoL and return to PLOF.       OBJECTIVE IMPAIRMENTS: Abnormal gait, decreased activity tolerance, decreased balance, decreased endurance, decreased knowledge of condition, decreased mobility, difficulty walking, decreased strength, increased fascial restrictions, impaired perceived functional ability, improper body mechanics, and postural dysfunction.   ACTIVITY LIMITATIONS: carrying, lifting, standing, squatting, stairs, bed mobility, and locomotion level  PARTICIPATION LIMITATIONS: laundry, community activity, and occupation  PERSONAL FACTORS: Age, Fitness, and Past/current experiences are also affecting patient's functional outcome.   REHAB POTENTIAL: Good  CLINICAL DECISION MAKING: Stable/uncomplicated  EVALUATION COMPLEXITY: Moderate   GOALS: Goals reviewed with patient? Yes   SHORT TERM GOALS: Target date: 05/28/2023    Patient will be independent in home exercise program to improve strength/mobility for better functional independence with ADLs. Baseline: 5/16: HEP compliant  Goal status: MET   LONG TERM GOALS: Target date: 07/09/2023    Patient will increase FOTO score to equal to or greater than 68   to demonstrate statistically significant improvement in mobility and quality of life.  Baseline: 54 Goal status: Ongoing   2.  Patient (> 23 years old) will complete five times sit to stand test in < 10 seconds  indicating an increased LE strength and improved balance. Baseline: 15.4sec 5/16: 9.4 seconds Goal status: MET  3.  Patient will increase 6 min walk test  to >1500 ft points to demonstrate decreased fall risk during functional activities Baseline: 1280ft. Mild increase in pain on the RLE 5/16: 1410  Goal status: IN PROGRESS  4.  Patient will increase 10 meter walk test to >1.29m/s as to improve gait speed for better community ambulation and to reduce fall risk. Baseline: 0.44m/s 5/1: 1.13 m/s  Goal status: MET  5.  Patient will increase FGA >26 as to demonstrate reduced fall risk and improved dynamic gait balance for better safety with community/home ambulation.  Baseline: to be completed 4/18: 17/30  5/16: 21 Goal status: IMET and progressed: New goal  6.  Patient will reduce pain to 0-1/10 with functional activities throughout day to demonstrate improved function and reduce impairment  Baseline:5/10 5/16: 5-6/10 6/10: 10/10  Goal status: On going    PLAN:  PT FREQUENCY: 1-2x/week  PT DURATION: 12 weeks  PLANNED INTERVENTIONS: Therapeutic exercises, Therapeutic activity, Neuromuscular re-education, Balance training, Gait training, Patient/Family education, Self Care, Joint mobilization, Joint manipulation, Stair training, Dry Needling, Electrical stimulation, Spinal manipulation, Spinal mobilization, Moist heat, Taping, Traction, and Manual therapy.  PLAN FOR NEXT SESSION:   Continue with core stabilization exercises,  manual therapy Pain modulation for R sided radicular pain,  RLE strengthening     Louis Meckel, PT Physical Therapist - [facility]  [facility]  06/21/23, 8:19 AM

## 2023-06-24 ENCOUNTER — Ambulatory Visit: Payer: Commercial Managed Care - PPO

## 2023-06-24 DIAGNOSIS — M545 Low back pain, unspecified: Secondary | ICD-10-CM | POA: Diagnosis not present

## 2023-06-24 DIAGNOSIS — R262 Difficulty in walking, not elsewhere classified: Secondary | ICD-10-CM

## 2023-06-24 DIAGNOSIS — M79606 Pain in leg, unspecified: Secondary | ICD-10-CM

## 2023-06-24 DIAGNOSIS — M6281 Muscle weakness (generalized): Secondary | ICD-10-CM

## 2023-06-24 NOTE — Therapy (Signed)
OUTPATIENT PHYSICAL THERAPY THORACOLUMBAR TREATMENT/Physical Therapy Progress Note   Dates of reporting period  05/16/2023   to   06/24/2023   Patient Name: Joyce Simpson MRN: [id number] DOB:December 21, 1961, 62 y.o., female Today's Date: 06/24/2023  END OF SESSION:  PT End of Session - 06/24/23 0803     Visit Number 20    Number of Visits 24    Date for PT Re-Evaluation 07/09/23    Progress Note Due on Visit 30    PT Start Time 0805    Activity Tolerance Patient tolerated treatment well    Behavior During Therapy [facility] for tasks assessed/performed                          Past Medical History:  Diagnosis Date   COVID-19    12/19/19   Diverticulosis    Heart murmur    Hyperlipidemia    Hypertension    Migraines    Osteoporosis    Past Surgical History:  Procedure Laterality Date   ABDOMINAL HYSTERECTOMY     dub 2008/2009 h/o abnormal pap    BACK SURGERY     cervical spine [place]   CESAREAN SECTION     x2    COLONOSCOPY WITH PROPOFOL N/A 04/11/2018   Procedure: COLONOSCOPY WITH PROPOFOL;  Surgeon: Wyline Mood, MD;  Location: [facility] ENDOSCOPY;  Service: Gastroenterology;  Laterality: N/A;   COLONOSCOPY WITH PROPOFOL N/A 06/20/2018   Procedure: COLONOSCOPY WITH PROPOFOL;  Surgeon: Wyline Mood, MD;  Location: [facility] ENDOSCOPY;  Service: Gastroenterology;  Laterality: N/A;   COLONOSCOPY WITH PROPOFOL N/A 09/22/2021   Procedure: COLONOSCOPY WITH PROPOFOL;  Surgeon: Wyline Mood, MD;  Location: [facility] ENDOSCOPY;  Service: Gastroenterology;  Laterality: N/A;   Patient Active Problem List   Diagnosis Date Noted   Breast cancer screening by mammogram 05/04/2023   Class 2 obesity 05/04/2023   Premature atrial contraction 02/28/2022   Hypokalemia 02/28/2022   Pruritus 11/03/2020   Hives 11/03/2020   Annual physical exam 08/02/2020   TMJ (temporomandibular joint disorder) 08/02/2020   Abnormal MRI, lumbar spine 08/02/2020   Lumbar radiculopathy 07/24/2020   Obesity  (BMI 30-39.9) 07/18/2020   Left sided sciatica 07/18/2020   Prediabetes 07/18/2020   Osteoporosis 02/28/2018   Vitamin D deficiency 11/05/2017   Environmental and seasonal allergies 03/29/2017   Dermatitis 07/12/2016   Acute low back pain without sciatica 05/29/2016   Numbness and tingling in right hand 05/29/2016   Cardiac murmur 10/31/2015   Hyperlipidemia 06/14/2015   Essential hypertension 01/29/2011   ABNORMAL ELECTROCARDIOGRAM 01/29/2011    PCP: pt transferring providers.    REFERRING PROVIDER: Tressie Stalker, MD  REFERRING DIAG: ICD-10-CM Intervertebral disc disorders with radiculopathy, lumbar region   Rationale for Evaluation and Treatment: Rehabilitation  THERAPY DIAG:  Muscle weakness (generalized)  Difficulty in walking, not elsewhere classified  Low back pain radiating down leg  ONSET DATE: 11/30/2022  SUBJECTIVE:  SUBJECTIVE STATEMENT:  Pt reports her pain has returned back to her basic pain- still high but improved since recent acute pain from fall last week. States feeling tight today and pain is 7.5/10  PERTINENT HISTORY:   From recent MD visit " 62 year old black female nonsmoker on whom had a C6-7 anterior cervical diskectomy fusion plating on 02/05/2011. She did well after that surgery and have not seen her in years.The patient returns today with a new problem. She tells me she began having pain in her right leg all on 11/30/2022 without notable precipitating events. The patient was seen at [facility] by several doctors. She had a lumbar MRI. She then had 2 lumbar epidural injections which helped " a few days" . When her pain recurred she was told she had a pinched nerve. She scheduled appointment for my opinion.During today's visit the patient is accompanied by her  husband and relates the above information. She complains of pain mainly in her right leg and what sounds like the L4 distribution. She says her right leg/knee gives out. Her symptoms are present more less all the time but worse at night. Her symptoms do not significantly worsened with standing walking. She does not have any radicular symptoms on the left. She has been treated with various medications including a prednisone taper, gabapentin, oxycodone, muscle relaxants, etcetera. She has not had any physical therapy or chiropractic care. She tells me her neck is doing well.  "  PAIN:  Are you having pain? Yes: NPRS scale: 7.5/10 Pain location: lateral R leg  Pain description: nagging dull  ach. Can be burning or tingling.  Aggravating factors: going up/down stairs Relieving factors: none  PRECAUTIONS: None  WEIGHT BEARING RESTRICTIONS: No  FALLS:  Has patient fallen in last 6 months? 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   PLOF: Independent and Independent with basic ADLs  PATIENT GOALS: relief in back pain   NEXT MD VISIT: July 2024   OBJECTIVE:   DIAGNOSTIC FINDINGS:  MRI - R lateral disc bulge at L4-L5  PATIENT SURVEYS:  FOTO 58   SCREENING FOR RED FLAGS: Bowel or bladder incontinence: No Spinal tumors: No Cauda equina syndrome: No Compression fracture: No Abdominal aneurysm: No  COGNITION: Overall cognitive status: Within functional limits for tasks assessed     SENSATION: WFL  MUSCLE LENGTH: Hamstrings: Right decreased lacking 30 deg full extension at knee deg; Left WFL lacking ~15 deg full extension   POSTURE: rounded shoulders, forward head, and weight shift right  PALPATION: Multiple trigger point in R paraspinals and QL.  Trigger points found in Piriformis and Glute med/max. Hypermobile in vertebrae T10-L5.   LUMBAR ROM:   AROM eval  Flexion   Extension   Right lateral flexion   Left lateral flexion   Right rotation   Left rotation    (Blank rows = not tested)  LOWER EXTREMITY ROM:     Active  Right eval Left eval  Hip flexion Lacking ~ 15 deg  WFL  Hip extension Unable to lift beyond neutral in prone [facility]  Hip abduction    Hip adduction    Hip internal rotation    Hip external rotation    Knee flexion    Knee extension  Ankle dorsiflexion    Ankle plantarflexion    Ankle inversion    Ankle eversion     (Blank rows = not tested)  LOWER EXTREMITY MMT:    MMT Right eval Left eval  Hip flexion 4- 4+  Hip extension 3 4-  Hip abduction 4- 5  Hip adduction 4 4+  Hip internal rotation    Hip external rotation    Knee flexion 4 5  Knee extension 4 5  Ankle dorsiflexion 4 5  Ankle plantarflexion 3+ 4+  Ankle inversion    Ankle eversion     (Blank rows = not tested)  LUMBAR SPECIAL TESTS:  Prone instability test: Positive, Straight leg raise test: Positive, Single leg stance test: TBD, SI Compression/distraction test: Negative, and FABER test: Positive  FUNCTIONAL TESTS:  5 times sit to stand: 13.22 with UE support 15.44 with no UE support  Timed up and go (TUG): 10.3  6 minute walk test: 1253ft. Mild increase in pain on the RLE 10 meter walk test: 10.9sec  Functional gait assessment: to be completed on next visit  GAIT: Distance walked: 40 Assistive device utilized: None Level of assistance: Complete Independence Comments: noted lateral cervical and trunk flexion to the R with decreased pelvic rotation Bil.   TODAY'S TREATMENT: DATE: 06/24/23  TherEx: RLE stretching and core/Low back exercises Supine  SKTC 10 sec holds, x3 each LE Supine Lower trunk rotation hold 10 sec x 5 Supine B hamstring stretch With Strap, SLR, 30 sec bouts x 2 Supine figure 4 stretch, 30 sec  bouts x 2 Hooklying B piriformis stretch, 30 sec hold x 3 Hooklying bridging with glute squeeze prior to lift-off, x10 Hooklying hip abduction RTB with TrA activation, 10 each   Physical therapy treatment session today consisted of completing assessment of goals and administration of testing as demonstrated and documented in flow sheet, treatment, and goals section of this note. Addition treatments may be found below.       PATIENT EDUCATION:  Education details: POC. Rehab potential. Pt educated throughout session about proper posture and technique with exercises. Improved exercise technique, movement at target joints, use of target muscles after min to mod verbal, visual, tactile cues.  Person educated: Patient Education method: Explanation, Demonstration, and Handouts Education comprehension: verbalized understanding and needs further education  HOME EXERCISE PROGRAM: Access Code: [email or web address] URL: [email or web address] Date: 06/20/2023 Prepared by: Maureen Ralphs  Exercises - Seated Lower Limb Slump Neural Mobilization  - 1 x daily - 7 x weekly - 3 sets - 10 reps      Access Code: [email or web address] URL: [email or web address] Date: 05/14/2023 Prepared by: Maureen Ralphs  Exercises - Child's Pose with Sidebending  - 1 x daily - 7 x weekly - 3 sets - 20-30 hold - Standing Quadratus Lumborum Stretch with Doorway  - 1 x daily - 7 x weekly - 3 sets - 20-30 hold  Access Code: [email or web address] URL: [email or web address] Date: 05/06/2023 Prepared by: Maureen Ralphs  Exercises - Supine Posterior Pelvic Tilt  - 1 x daily - 7 x weekly - 3 sets - 10 reps - Clamshell  - 1 x daily - 7 x weekly - 3 sets - 10 reps     Access Code: [email or web address] URL: [email or web address] Date: 04/16/2023 Prepared by: Grier Rocher  Exercises - Supine Bridge  - 1 x daily - 7 x weekly - 3 sets - 10 reps - 2 hold - Supine Lower Trunk Rotation  - 1 x  daily - 7 x weekly -  3 sets - 10 reps - 3 hold - Seated Piriformis Stretch with Trunk Bend  - 1 x daily - 7 x weekly - 3 sets - 5 reps - 15 hold  ASSESSMENT:  CLINICAL IMPRESSION: Overall patient continues to make functional progress as seen by improved and improved balance as seen by higher score on FGA. She also presents with much improved report of pain after stretching and walking- reporting no RLE pain after 6 min walk today. Patient's condition has the potential to improve in response to therapy. Maximum improvement is yet to be obtained. The anticipated improvement is attainable and reasonable in a generally predictable time.  Pt will continue to benefit from skilled therapy to address remaining deficits in order to improve overall QoL and return to PLOF.       OBJECTIVE IMPAIRMENTS: Abnormal gait, decreased activity tolerance, decreased balance, decreased endurance, decreased knowledge of condition, decreased mobility, difficulty walking, decreased strength, increased fascial restrictions, impaired perceived functional ability, improper body mechanics, and postural dysfunction.   ACTIVITY LIMITATIONS: carrying, lifting, standing, squatting, stairs, bed mobility, and locomotion level  PARTICIPATION LIMITATIONS: laundry, community activity, and occupation  PERSONAL FACTORS: Age, Fitness, and Past/current experiences are also affecting patient's functional outcome.   REHAB POTENTIAL: Good  CLINICAL DECISION MAKING: Stable/uncomplicated  EVALUATION COMPLEXITY: Moderate   GOALS: Goals reviewed with patient? Yes   SHORT TERM GOALS: Target date: 05/28/2023    Patient will be independent in home exercise program to improve strength/mobility for better functional independence with ADLs. Baseline: 5/16: HEP compliant  Goal status: MET   LONG TERM GOALS: Target date: 07/09/2023    Patient will increase FOTO score to equal to or greater than 68   to demonstrate statistically  significant improvement in mobility and quality of life.  Baseline: 54; 06/24/2023=54 Goal status: Ongoing   2.  Patient (> 34 years old) will complete five times sit to stand test in < 10 seconds indicating an increased LE strength and improved balance. Baseline: 15.4sec 5/16: 9.4 seconds Goal status: MET  3.  Patient will increase 6 min walk test  to >1500 ft points to demonstrate decreased fall risk during functional activities Baseline: 1273ft. Mild increase in pain on the RLE 5/16: 1410; 06/24/2023= 1485 feet without AD and 0/10 RLE today Goal status: Progressing  4.  Patient will increase 10 meter walk test to >1.27m/s as to improve gait speed for better community ambulation and to reduce fall risk. Baseline: 0.41m/s 5/1: 1.13 m/s  Goal status: MET  5.  Patient will increase FGA >26 as to demonstrate reduced fall risk and improved dynamic gait balance for better safety with community/home ambulation.  Baseline: to be completed 4/18: 17/30  5/16: 21; 6/24= 26 Goal status: IMET and progressed: PROGRESSING  6.  Patient will reduce pain to 0-1/10 with functional activities throughout day to demonstrate improved function and reduce impairment  Baseline:5/10 5/16: 5-6/10 6/10: 10/10: 06/24/2023= 7.5/10 RLE pain initially but after stretching and walking - 0/10 reported. Goal status: On going    PLAN:  PT FREQUENCY: 1-2x/week  PT DURATION: 12 weeks  PLANNED INTERVENTIONS: Therapeutic exercises, Therapeutic activity, Neuromuscular re-education, Balance training, Gait training, Patient/Family education, Self Care, Joint mobilization, Joint manipulation, Stair training, Dry Needling, Electrical stimulation, Spinal manipulation, Spinal mobilization, Moist heat, Taping, Traction, and Manual therapy.  PLAN FOR NEXT SESSION:   Continue with core stabilization exercises,  manual therapy Pain modulation for R sided radicular pain,  RLE strengthening     Louis Meckel,  PT Physical  Therapist - [facility]  06/24/23, 8:09 AM

## 2023-06-26 ENCOUNTER — Ambulatory Visit: Payer: Commercial Managed Care - PPO

## 2023-06-26 DIAGNOSIS — R262 Difficulty in walking, not elsewhere classified: Secondary | ICD-10-CM

## 2023-06-26 DIAGNOSIS — M6281 Muscle weakness (generalized): Secondary | ICD-10-CM

## 2023-06-26 DIAGNOSIS — M79606 Pain in leg, unspecified: Secondary | ICD-10-CM | POA: Diagnosis not present

## 2023-06-26 DIAGNOSIS — M545 Low back pain, unspecified: Secondary | ICD-10-CM | POA: Diagnosis not present

## 2023-06-26 NOTE — Therapy (Signed)
OUTPATIENT PHYSICAL THERAPY THORACOLUMBAR TREATMENT   Patient Name: Joyce Simpson MRN: [id number] DOB:12/11/1961, 62 y.o., female Today's Date: 06/27/2023  END OF SESSION:  PT End of Session - 06/26/23 0801     Visit Number 21    Number of Visits 24    Date for PT Re-Evaluation 07/09/23    Progress Note Due on Visit 30    PT Start Time 0800    PT Stop Time 0839    PT Time Calculation (min) 39 min    Activity Tolerance Patient tolerated treatment well    Behavior During Therapy [facility] for tasks assessed/performed                          Past Medical History:  Diagnosis Date   COVID-19    12/19/19   Diverticulosis    Heart murmur    Hyperlipidemia    Hypertension    Migraines    Osteoporosis    Past Surgical History:  Procedure Laterality Date   ABDOMINAL HYSTERECTOMY     dub 2008/2009 h/o abnormal pap    BACK SURGERY     cervical spine [place] [facility]   CESAREAN SECTION     x2    COLONOSCOPY WITH PROPOFOL N/A 04/11/2018   Procedure: COLONOSCOPY WITH PROPOFOL;  Surgeon: Wyline Mood, MD;  Location: [facility] ENDOSCOPY;  Service: Gastroenterology;  Laterality: N/A;   COLONOSCOPY WITH PROPOFOL N/A 06/20/2018   Procedure: COLONOSCOPY WITH PROPOFOL;  Surgeon: Wyline Mood, MD;  Location: [facility] ENDOSCOPY;  Service: Gastroenterology;  Laterality: N/A;   COLONOSCOPY WITH PROPOFOL N/A 09/22/2021   Procedure: COLONOSCOPY WITH PROPOFOL;  Surgeon: Wyline Mood, MD;  Location: [facility] ENDOSCOPY;  Service: Gastroenterology;  Laterality: N/A;   Patient Active Problem List   Diagnosis Date Noted   Breast cancer screening by mammogram 05/04/2023   Class 2 obesity 05/04/2023   Premature atrial contraction 02/28/2022   Hypokalemia 02/28/2022   Pruritus 11/03/2020   Hives 11/03/2020   Annual physical exam 08/02/2020   TMJ (temporomandibular joint disorder) 08/02/2020   Abnormal MRI, lumbar spine 08/02/2020   Lumbar radiculopathy 07/24/2020   Obesity (BMI 30-39.9) 07/18/2020    Left sided sciatica 07/18/2020   Prediabetes 07/18/2020   Osteoporosis 02/28/2018   Vitamin D deficiency 11/05/2017   Environmental and seasonal allergies 03/29/2017   Dermatitis 07/12/2016   Acute low back pain without sciatica 05/29/2016   Numbness and tingling in right hand 05/29/2016   Cardiac murmur 10/31/2015   Hyperlipidemia 06/14/2015   Essential hypertension 01/29/2011   ABNORMAL ELECTROCARDIOGRAM 01/29/2011    PCP: pt transferring providers.    REFERRING PROVIDER: Tressie Stalker, MD  REFERRING DIAG: ICD-10-CM Intervertebral disc disorders with radiculopathy, lumbar region   Rationale for Evaluation and Treatment: Rehabilitation  THERAPY DIAG:  Muscle weakness (generalized)  Difficulty in walking, not elsewhere classified  Low back pain radiating down leg  ONSET DATE: 11/30/2022  SUBJECTIVE:  SUBJECTIVE STATEMENT:  Pt reports pain is the same as usual- 7.5/10. States she is going on vacation next week and then on 07/09/2023 returns to MD to discuss her options including surgery. She states the exercises help loosen her up and do bring her pain down some but pain always returns.   PERTINENT HISTORY:   From recent MD visit " 62 year old black female nonsmoker on whom had a C6-7 anterior cervical diskectomy fusion plating on 02/05/2011. She did well after that surgery and have not seen her in years.The patient returns today with a new problem. She tells me she began having pain in her right leg all on 11/30/2022 without notable precipitating events. The patient was seen at [facility] by several doctors. She had a lumbar MRI. She then had 2 lumbar epidural injections which helped " a few days" . When her pain recurred she was told she had a pinched nerve. She scheduled appointment for my  opinion.During today's visit the patient is accompanied by her husband and relates the above information. She complains of pain mainly in her right leg and what sounds like the L4 distribution. She says her right leg/knee gives out. Her symptoms are present more less all the time but worse at night. Her symptoms do not significantly worsened with standing walking. She does not have any radicular symptoms on the left. She has been treated with various medications including a prednisone taper, gabapentin, oxycodone, muscle relaxants, etcetera. She has not had any physical therapy or chiropractic care. She tells me her neck is doing well.  "  PAIN:  Are you having pain? Yes: NPRS scale: 7.5/10 Pain location: lateral R leg  Pain description: nagging dull  ach. Can be burning or tingling.  Aggravating factors: going up/down stairs Relieving factors: none  PRECAUTIONS: None  WEIGHT BEARING RESTRICTIONS: No  FALLS:  Has patient fallen in last 6 months? 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   PLOF: Independent and Independent with basic ADLs  PATIENT GOALS: relief in back pain   NEXT MD VISIT: July 2024   OBJECTIVE:   DIAGNOSTIC FINDINGS:  MRI - R lateral disc bulge at L4-L5  PATIENT SURVEYS:  FOTO 58   SCREENING FOR RED FLAGS: Bowel or bladder incontinence: No Spinal tumors: No Cauda equina syndrome: No Compression fracture: No Abdominal aneurysm: No  COGNITION: Overall cognitive status: Within functional limits for tasks assessed     SENSATION: WFL  MUSCLE LENGTH: Hamstrings: Right decreased lacking 30 deg full extension at knee deg; Left WFL lacking ~15 deg full extension   POSTURE: rounded shoulders, forward head, and weight shift  right  PALPATION: Multiple trigger point in R paraspinals and QL. Trigger points found in Piriformis and Glute med/max. Hypermobile in vertebrae T10-L5.   LUMBAR ROM:   AROM eval  Flexion   Extension   Right lateral flexion   Left lateral flexion   Right rotation   Left rotation    (Blank rows = not tested)  LOWER EXTREMITY ROM:     Active  Right eval Left eval  Hip flexion Lacking ~ 15 deg  WFL  Hip extension Unable to lift beyond neutral in prone [facility]  Hip abduction    Hip adduction    Hip internal  rotation    Hip external rotation    Knee flexion    Knee extension    Ankle dorsiflexion    Ankle plantarflexion    Ankle inversion    Ankle eversion     (Blank rows = not tested)  LOWER EXTREMITY MMT:    MMT Right eval Left eval  Hip flexion 4- 4+  Hip extension 3 4-  Hip abduction 4- 5  Hip adduction 4 4+  Hip internal rotation    Hip external rotation    Knee flexion 4 5  Knee extension 4 5  Ankle dorsiflexion 4 5  Ankle plantarflexion 3+ 4+  Ankle inversion    Ankle eversion     (Blank rows = not tested)  LUMBAR SPECIAL TESTS:  Prone instability test: Positive, Straight leg raise test: Positive, Single leg stance test: TBD, SI Compression/distraction test: Negative, and FABER test: Positive  FUNCTIONAL TESTS:  5 times sit to stand: 13.22 with UE support 15.44 with no UE support  Timed up and go (TUG): 10.3  6 minute walk test: 121ft. Mild increase in pain on the RLE 10 meter walk test: 10.9sec  Functional gait assessment: to be completed on next visit  GAIT: Distance walked: 40 Assistive device utilized: None Level of assistance: Complete Independence Comments: noted lateral cervical and trunk flexion to the R with decreased pelvic rotation Bil.   TODAY'S TREATMENT: DATE: 06/26/2023  TherEx: RLE stretching and core/Low back exercises Reviewed stretching and core stabilization as patient will be on vacation all next week then plans to return to  MD to discuss further options including potential surgery.   Seated hamstring stretch- hold 30 sec x 3 each LE Seated figure 4 stretch- hold 30 sec Seated lumbar flex x 10 (straight) and 10 to left and right as well Seated Neural stretch (slump) head forward with toe raise x   Supine  SKTC 10 sec holds, x3 each LE Supine Lower trunk rotation hold 10 sec x 5 Supine TrA contraction with Hip march in hooklye and 2 sec hold x 10 Hooklying bridging with glute squeeze prior to lift-off, x10 Hooklying hip abduction GTB with TrA activation, 10 each         PATIENT EDUCATION:  Education details: Exercise technique/Review of HEP.   Pt educated throughout session about proper posture and technique with exercises. Improved exercise technique, movement at target joints, use of target muscles after min to mod verbal, visual, tactile cues.  Person educated: Patient Education method: Explanation, Demonstration, and Handouts Education comprehension: verbalized understanding and needs further education  HOME EXERCISE PROGRAM: Access Code: [email or web address] URL: [email or web address] Date: 06/20/2023 Prepared by: Maureen Ralphs  Exercises - Seated Lower Limb Slump Neural Mobilization  - 1 x daily - 7 x weekly - 3 sets - 10 reps      Access Code: [email or web address] URL: [email or web address] Date: 05/14/2023 Prepared by: Maureen Ralphs  Exercises - Child's Pose with Sidebending  - 1 x daily - 7 x weekly - 3 sets - 20-30 hold - Standing Quadratus Lumborum Stretch with Doorway  - 1 x daily - 7 x weekly - 3 sets - 20-30 hold  Access Code: [email or web address] URL: [email or web address] Date: 05/06/2023 Prepared by: Maureen Ralphs  Exercises - Supine Posterior Pelvic Tilt  - 1 x daily - 7 x weekly - 3 sets - 10 reps - Clamshell  - 1 x daily - 7 x weekly - 3 sets - 10 reps     Access Code: [email or web address] URL: [email or web address] Date:  04/16/2023 Prepared by: Grier Rocher  Exercises - Supine Bridge  - 1 x daily - 7 x weekly - 3 sets - 10 reps - 2 hold - Supine Lower Trunk Rotation  - 1 x daily - 7 x weekly - 3 sets - 10 reps - 3 hold - Seated Piriformis Stretch with Trunk Bend  - 1 x daily - 7 x weekly - 3 sets - 5 reps - 15 hold  ASSESSMENT:  CLINICAL IMPRESSION: Overall patient continues to perform well despite high subjective pain. Since she will be on vacation next week and plans to return to MD to discuss surgery- treatment focused today on HEP and reminders for back safety. Reminded of importance of changing position often to avoid stiffness/pain. Also discussed importance of walking and compliance of HEP as instructed to maintain improved function despite pain. Will await either MD recommendation or patient phone call to determine if any further PT warranted. At this point patient presents with good understanding of HEP including low back/LE flexibility, low back, core, and LE strengthening, and importance of mobility for pain relief.    OBJECTIVE IMPAIRMENTS: Abnormal gait, decreased activity tolerance, decreased balance, decreased endurance, decreased knowledge of condition, decreased mobility, difficulty walking, decreased strength, increased fascial restrictions, impaired perceived functional ability, improper body mechanics, and postural dysfunction.   ACTIVITY LIMITATIONS: carrying, lifting, standing, squatting, stairs, bed mobility, and locomotion level  PARTICIPATION LIMITATIONS: laundry, community activity, and occupation  PERSONAL FACTORS: Age, Fitness, and Past/current experiences are also affecting patient's functional outcome.   REHAB POTENTIAL: Good  CLINICAL DECISION MAKING: Stable/uncomplicated  EVALUATION COMPLEXITY: Moderate   GOALS: Goals reviewed with patient? Yes   SHORT TERM GOALS: Target date: 05/28/2023    Patient will be independent in home exercise program to improve  strength/mobility for better functional independence with ADLs. Baseline: 5/16: HEP compliant  Goal status: MET   LONG TERM GOALS: Target date: 07/09/2023    Patient will increase FOTO score to equal to or greater than 68   to demonstrate statistically significant improvement in mobility and quality of life.  Baseline: 54; 06/24/2023=54 Goal status: Ongoing   2.  Patient (> 14 years old) will complete five times sit to stand test in < 10 seconds indicating an increased LE strength and improved balance. Baseline: 15.4sec 5/16: 9.4 seconds Goal status: MET  3.  Patient will increase 6 min walk test  to >1500 ft points to demonstrate decreased fall risk during functional activities Baseline: 1250ft. Mild increase in pain on the RLE 5/16: 1410; 06/24/2023= 1485 feet without AD and 0/10 RLE today Goal status: Progressing  4.  Patient will increase 10 meter walk test to >1.24m/s as to improve gait speed for better community ambulation and to reduce fall risk. Baseline: 0.12m/s 5/1: 1.13 m/s  Goal status: MET  5.  Patient will increase FGA >26 as to demonstrate reduced fall risk and improved dynamic gait balance for better safety with community/home ambulation.  Baseline: to be completed 4/18: 17/30  5/16: 21; 6/24= 26 Goal status: IMET and progressed: PROGRESSING  6.  Patient will reduce pain to 0-1/10 with functional activities throughout day to demonstrate improved function and reduce impairment  Baseline:5/10 5/16: 5-6/10 6/10: 10/10: 06/24/2023= 7.5/10 RLE pain initially but after stretching and walking - 0/10 reported. Goal status: On going    PLAN:  PT FREQUENCY: 1-2x/week  PT DURATION: 12 weeks  PLANNED INTERVENTIONS: Therapeutic exercises, Therapeutic activity, Neuromuscular re-education, Balance training, Gait training, Patient/Family education, Self Care, Joint mobilization,  Joint manipulation, Stair training, Dry Needling, Electrical stimulation, Spinal manipulation, Spinal  mobilization, Moist heat, Taping, Traction, and Manual therapy.  PLAN FOR NEXT SESSION:   Continue if needed. Pending results of patient's next MD visit on 07/09/2023.     Louis Meckel, PT Physical Therapist - [facility]  06/27/23, 8:02 AM

## 2023-07-01 ENCOUNTER — Ambulatory Visit: Payer: Commercial Managed Care - PPO

## 2023-07-05 ENCOUNTER — Ambulatory Visit: Payer: Commercial Managed Care - PPO

## 2023-07-08 ENCOUNTER — Ambulatory Visit: Payer: Commercial Managed Care - PPO | Admitting: Physical Therapy

## 2023-07-09 DIAGNOSIS — M5116 Intervertebral disc disorders with radiculopathy, lumbar region: Secondary | ICD-10-CM | POA: Diagnosis not present

## 2023-07-10 ENCOUNTER — Ambulatory Visit: Payer: Commercial Managed Care - PPO | Admitting: Physical Therapy

## 2023-07-10 ENCOUNTER — Telehealth: Payer: Self-pay | Admitting: Physical Therapy

## 2023-07-10 NOTE — Telephone Encounter (Signed)
PT spoke with pt this AM. she reports that she saw her MD yesterday and will to moving forward with surgical management of back pain. Therefore, she will no longer require PT services at this time. Encouraged to call clinic for any future Pt needs following surgery.    Grier Rocher PT, DPT  Physical Therapist -   [facility]  8:22 AM 07/10/23

## 2023-07-15 ENCOUNTER — Ambulatory Visit: Payer: Commercial Managed Care - PPO | Admitting: Physical Therapy

## 2023-07-17 ENCOUNTER — Ambulatory Visit: Payer: Commercial Managed Care - PPO | Admitting: Physical Therapy

## 2023-07-22 ENCOUNTER — Ambulatory Visit: Payer: Commercial Managed Care - PPO

## 2023-07-24 ENCOUNTER — Ambulatory Visit: Payer: Commercial Managed Care - PPO | Admitting: Physical Therapy

## 2023-07-29 ENCOUNTER — Other Ambulatory Visit: Payer: Self-pay

## 2023-07-29 ENCOUNTER — Ambulatory Visit: Payer: Commercial Managed Care - PPO

## 2023-07-29 DIAGNOSIS — M5116 Intervertebral disc disorders with radiculopathy, lumbar region: Secondary | ICD-10-CM | POA: Diagnosis not present

## 2023-07-29 DIAGNOSIS — M5136 Other intervertebral disc degeneration, lumbar region: Secondary | ICD-10-CM | POA: Diagnosis not present

## 2023-07-29 MED ORDER — OXYCODONE-ACETAMINOPHEN 5-325 MG PO TABS
1.0000 | ORAL_TABLET | ORAL | 0 refills | Status: DC | PRN
  Filled 2023-07-29: qty 30, 5d supply, fill #0

## 2023-07-29 MED ORDER — CYCLOBENZAPRINE HCL 10 MG PO TABS
10.0000 mg | ORAL_TABLET | Freq: Three times a day (TID) | ORAL | 0 refills | Status: DC | PRN
  Filled 2023-07-29: qty 30, 10d supply, fill #0

## 2023-07-31 ENCOUNTER — Other Ambulatory Visit: Payer: Self-pay

## 2023-07-31 ENCOUNTER — Encounter: Payer: Commercial Managed Care - PPO | Admitting: Physical Therapy

## 2023-07-31 MED ORDER — OXYCODONE-ACETAMINOPHEN 5-325 MG PO TABS
1.0000 | ORAL_TABLET | ORAL | 0 refills | Status: DC | PRN
  Filled 2023-07-31 – 2023-08-01 (×2): qty 40, 4d supply, fill #0

## 2023-08-01 ENCOUNTER — Other Ambulatory Visit: Payer: Self-pay

## 2023-08-05 ENCOUNTER — Other Ambulatory Visit: Payer: Self-pay

## 2023-08-07 ENCOUNTER — Ambulatory Visit: Payer: Commercial Managed Care - PPO | Admitting: Family Medicine

## 2023-08-07 ENCOUNTER — Encounter: Payer: Self-pay | Admitting: Neurosurgery

## 2023-08-07 ENCOUNTER — Encounter: Payer: Commercial Managed Care - PPO | Admitting: Physical Therapy

## 2023-08-08 ENCOUNTER — Other Ambulatory Visit: Payer: Self-pay | Admitting: Neurosurgery

## 2023-08-08 ENCOUNTER — Ambulatory Visit
Admission: RE | Admit: 2023-08-08 | Discharge: 2023-08-08 | Disposition: A | Discharge status: Home / Self Care | Payer: Commercial Managed Care - PPO | Source: Ambulatory Visit | Attending: Neurosurgery | Admitting: Neurosurgery

## 2023-08-08 DIAGNOSIS — R600 Localized edema: Secondary | ICD-10-CM | POA: Diagnosis not present

## 2023-08-08 DIAGNOSIS — M7989 Other specified soft tissue disorders: Secondary | ICD-10-CM

## 2023-08-26 ENCOUNTER — Ambulatory Visit: Payer: Commercial Managed Care - PPO | Admitting: Family Medicine

## 2023-09-11 ENCOUNTER — Ambulatory Visit: Payer: Commercial Managed Care - PPO | Admitting: Family Medicine

## 2023-09-26 ENCOUNTER — Ambulatory Visit: Payer: Commercial Managed Care - PPO | Admitting: Family Medicine

## 2023-09-26 ENCOUNTER — Encounter: Payer: Self-pay | Admitting: Family Medicine

## 2023-09-26 ENCOUNTER — Other Ambulatory Visit (HOSPITAL_COMMUNITY)
Admission: RE | Admit: 2023-09-26 | Discharge: 2023-09-26 | Disposition: A | Discharge status: Home / Self Care | Payer: Commercial Managed Care - PPO | Source: Ambulatory Visit | Attending: Family Medicine | Admitting: Family Medicine

## 2023-09-26 VITALS — BP 122/76 | HR 81 | Temp 98.0°F | Resp 16 | Ht 60.0 in | Wt 196.4 lb

## 2023-09-26 DIAGNOSIS — Z124 Encounter for screening for malignant neoplasm of cervix: Secondary | ICD-10-CM | POA: Insufficient documentation

## 2023-09-26 DIAGNOSIS — E559 Vitamin D deficiency, unspecified: Secondary | ICD-10-CM | POA: Diagnosis not present

## 2023-09-26 MED ORDER — VITAMIN D3 25 MCG (1000 UT) PO CAPS: 1000.0000 [IU] | ORAL_CAPSULE | Freq: Every day | ORAL | Status: DC

## 2023-09-26 NOTE — Patient Instructions (Addendum)
It was a pleasure meeting you today. Thank you for allowing me to take part in your health care.  Our goals for today as we discussed include:  Will send you MyChart letter with results of PAP when resulted  Please send Flu vaccination update when received at [facility]  Continue Vitamin D 1000IU daily.   This is a list of the screening recommended for you and due dates:  Health Maintenance  Topic Date Due   Zoster (Shingles) Vaccine (1 of 2) Never done   COVID-19 Vaccine (5 - 2023-24 season) 09/01/2023   Mammogram  10/23/2023   DEXA scan (bone density measurement)  10/22/2024   Pap with HPV screening  08/02/2025   Colon Cancer Screening  09/22/2026   DTaP/Tdap/Td vaccine (2 - Td or Tdap) 07/28/2028   Hepatitis C Screening  Completed   HIV Screening  Completed   HPV Vaccine  Aged Out   Flu Shot  Discontinued     Follow up as needed  If you have any questions or concerns, please do not hesitate to call the office at [phone].  I look forward to our next visit and until then take care and stay safe.  Regards,   Dana Allan, MD   [facility]

## 2023-09-26 NOTE — Progress Notes (Signed)
   SUBJECTIVE:   Chief Complaint  Patient presents with   Gynecologic Exam   HPI Presents for annual PAP  No acute concerns  Postmenopausal No vaginal pain, discharge or bleeding.   No dyspareunia.   No previous abnormal PAP   PERTINENT PMH / PSH: As above  OBJECTIVE:  BP 122/76   Pulse 81   Temp 98 F (36.7 C)   Resp 16   Ht 5' (1.524 m)   Wt 196 lb 6 oz (89.1 kg)   SpO2 100%   BMI 38.35 kg/m    Physical Exam Exam conducted with a chaperone present.  Genitourinary:    General: Normal vulva.     Exam position: Lithotomy position.     Pubic Area: No rash.      Labia:        Right: No rash or lesion.        Left: No rash or lesion.      Vagina: Normal.     Cervix: Normal. No friability, lesion, erythema or cervical bleeding.     Uterus: Normal.      Adnexa: Right adnexa normal and left adnexa normal.        09/26/2023    1:03 PM 06/06/2023    9:35 AM 04/24/2023    8:13 AM 08/31/2022    2:55 PM 08/04/2021    7:46 AM  Depression screen PHQ 2/9  Decreased Interest 0 0 0 0 0  Down, Depressed, Hopeless 0 0 0 0 0  PHQ - 2 Score 0 0 0 0 0  Altered sleeping 0 1 0    Tired, decreased energy 0 1 0    Change in appetite 0 0 0    Feeling bad or failure about yourself  0 0 0    Trouble concentrating 0 0 0    Moving slowly or fidgety/restless 0 0 0    Suicidal thoughts 0 0 0    PHQ-9 Score 0 2 0    Difficult doing work/chores Not difficult at all Not difficult at all Not difficult at all        09/26/2023    1:04 PM 06/06/2023    9:35 AM 04/24/2023    8:13 AM 08/02/2020    8:17 AM  GAD 7 : Generalized Anxiety Score  Nervous, Anxious, on Edge 0 0 0 0  Control/stop worrying 0 0 0 0  Worry too much - different things 0 0 0 0  Trouble relaxing 0 0 0 0  Restless 0 0 0 0  Easily annoyed or irritable 0 0 0 0  Afraid - awful might happen 0 0 0 0  Total GAD 7 Score 0 0 0 0  Anxiety Difficulty Not difficult at all Not difficult at all Not difficult at all Not difficult at  all    ASSESSMENT/PLAN:  Cervical cancer screening Assessment & Plan: UPT not indicated Postmenopausal Pap smear: performed with HPV cotesting Will have aged out in 3-5 years.  No indication for STI screening   Orders: -     Cytology - PAP  Vitamin D deficiency -     Vitamin D3; Take 1 capsule (1,000 Units total) by mouth daily.   PDMP reviewed  Return if symptoms worsen or fail to improve, for PCP.  Dana Allan, MD

## 2023-10-03 LAB — CYTOLOGY - PAP
Adequacy: ABSENT
Comment: NEGATIVE
Diagnosis: NEGATIVE
High risk HPV: NEGATIVE

## 2023-10-04 ENCOUNTER — Encounter: Payer: Self-pay | Admitting: Family Medicine

## 2023-10-04 ENCOUNTER — Other Ambulatory Visit: Payer: Self-pay

## 2023-10-04 ENCOUNTER — Telehealth: Payer: Self-pay | Admitting: Family Medicine

## 2023-10-04 DIAGNOSIS — I10 Essential (primary) hypertension: Secondary | ICD-10-CM

## 2023-10-04 MED ORDER — AMLODIPINE BESYLATE 5 MG PO TABS
5.0000 mg | ORAL_TABLET | Freq: Every day | ORAL | 3 refills | Status: DC
  Filled 2023-10-04: qty 90, 90d supply, fill #0
  Filled 2024-01-14: qty 90, 90d supply, fill #1
  Filled 2024-05-06: qty 90, 90d supply, fill #2
  Filled 2024-08-20: qty 90, 90d supply, fill #3

## 2023-10-04 NOTE — Telephone Encounter (Signed)
Prescription Request  10/04/2023  LOV: 09/26/2023  What is the name of the medication or equipment?  amLODipine (NORVASC) 5 MG tablet and  losartan-hydrochlorothiazide (HYZAAR) 50-12.5 MG tablet   Have you contacted your pharmacy to request a reill? Yes   Which pharmacy would you like this sent to?  [facility] [address] [place] [address] Phone: [phone] Fax: [phone]    Patient notified that their request is being sent to the clinical staff for review and that they should receive a response within 2 business days.   Please advise at [facility] [phone]

## 2023-10-04 NOTE — Telephone Encounter (Signed)
RX sent to pharmacy 

## 2023-10-06 ENCOUNTER — Encounter: Payer: Self-pay | Admitting: Family Medicine

## 2023-10-06 DIAGNOSIS — Z124 Encounter for screening for malignant neoplasm of cervix: Secondary | ICD-10-CM | POA: Insufficient documentation

## 2023-10-06 NOTE — Assessment & Plan Note (Signed)
UPT not indicated Postmenopausal Pap smear: performed with HPV cotesting Will have aged out in 3-5 years.  No indication for STI screening

## 2023-10-25 ENCOUNTER — Ambulatory Visit
Admission: RE | Admit: 2023-10-25 | Discharge: 2023-10-25 | Disposition: A | Discharge status: Home / Self Care | Payer: Commercial Managed Care - PPO | Source: Ambulatory Visit | Attending: Family Medicine | Admitting: Family Medicine

## 2023-10-25 DIAGNOSIS — Z1231 Encounter for screening mammogram for malignant neoplasm of breast: Secondary | ICD-10-CM | POA: Insufficient documentation

## 2023-11-11 ENCOUNTER — Other Ambulatory Visit: Payer: Self-pay

## 2023-11-11 ENCOUNTER — Telehealth: Payer: Self-pay | Admitting: Family Medicine

## 2023-11-11 NOTE — Telephone Encounter (Signed)
Prescription Request  11/11/2023  LOV: 09/26/2023  What is the name of the medication or equipment? losartan-hydrochlorothiazide (HYZAAR) 50-12.5 MG tablet   Have you contacted your pharmacy to request a refill? Yes   Which pharmacy would you like this sent to?  [facility] [address] [place] [address] Phone: [phone] Fax: [phone]    Patient notified that their request is being sent to the clinical staff for review and that they should receive a response within 2 business days.   Please advise at Mobile [phone] (mobile)

## 2023-11-12 ENCOUNTER — Other Ambulatory Visit: Payer: Self-pay

## 2023-11-12 MED FILL — Losartan Potassium & Hydrochlorothiazide Tab 50-12.5 MG: ORAL | 90 days supply | Qty: 90 | Fill #0 | Status: AC

## 2023-11-12 NOTE — Telephone Encounter (Signed)
RX sent to pharmacy  

## 2023-11-14 ENCOUNTER — Other Ambulatory Visit: Payer: Self-pay

## 2023-11-19 ENCOUNTER — Other Ambulatory Visit: Payer: Self-pay

## 2023-11-19 DIAGNOSIS — Z6838 Body mass index (BMI) 38.0-38.9, adult: Secondary | ICD-10-CM | POA: Diagnosis not present

## 2023-11-19 DIAGNOSIS — M5116 Intervertebral disc disorders with radiculopathy, lumbar region: Secondary | ICD-10-CM | POA: Diagnosis not present

## 2023-11-19 MED ORDER — DENOSUMAB 60 MG/ML ~~LOC~~ SOSY
60.0000 mg | PREFILLED_SYRINGE | Freq: Once | SUBCUTANEOUS | Status: AC
  Administered 2023-12-09: 60 mg via SUBCUTANEOUS

## 2023-11-19 MED ORDER — GABAPENTIN 100 MG PO CAPS
100.0000 mg | ORAL_CAPSULE | Freq: Every day | ORAL | 1 refills | Status: DC
  Filled 2023-11-19: qty 90, 30d supply, fill #0

## 2023-11-19 NOTE — Addendum Note (Signed)
Addended by: Warden Fillers on: 11/19/2023 03:24 PM   Modules accepted: Orders

## 2023-11-22 ENCOUNTER — Other Ambulatory Visit: Payer: Self-pay

## 2023-11-22 MED ORDER — PREGABALIN 50 MG PO CAPS
ORAL_CAPSULE | ORAL | 0 refills | Status: DC
  Filled 2023-11-22: qty 60, 34d supply, fill #0

## 2023-11-25 ENCOUNTER — Other Ambulatory Visit: Payer: Self-pay

## 2023-12-09 ENCOUNTER — Encounter: Payer: Self-pay | Admitting: Family Medicine

## 2023-12-09 ENCOUNTER — Ambulatory Visit: Payer: Commercial Managed Care - PPO | Admitting: Family Medicine

## 2023-12-09 ENCOUNTER — Other Ambulatory Visit: Payer: Self-pay

## 2023-12-09 VITALS — BP 138/80 | HR 96 | Temp 98.0°F | Resp 18 | Ht 60.0 in | Wt 190.0 lb

## 2023-12-09 DIAGNOSIS — M810 Age-related osteoporosis without current pathological fracture: Secondary | ICD-10-CM

## 2023-12-09 DIAGNOSIS — J0111 Acute recurrent frontal sinusitis: Secondary | ICD-10-CM

## 2023-12-09 DIAGNOSIS — J3489 Other specified disorders of nose and nasal sinuses: Secondary | ICD-10-CM

## 2023-12-09 DIAGNOSIS — J309 Allergic rhinitis, unspecified: Secondary | ICD-10-CM

## 2023-12-09 MED ORDER — SACCHAROMYCES BOULARDII 250 MG PO CAPS
250.0000 mg | ORAL_CAPSULE | Freq: Every day | ORAL | 0 refills | Status: DC
  Filled 2023-12-09: qty 90, 90d supply, fill #0
  Filled 2023-12-09: qty 30, 30d supply, fill #0

## 2023-12-09 MED ORDER — FLUTICASONE PROPIONATE 50 MCG/ACT NA SUSP
2.0000 | Freq: Every day | NASAL | 6 refills | Status: DC
  Filled 2023-12-09: qty 16, 30d supply, fill #0

## 2023-12-09 MED ORDER — AMOXICILLIN-POT CLAVULANATE 875-125 MG PO TABS
1.0000 | ORAL_TABLET | Freq: Two times a day (BID) | ORAL | 0 refills | Status: AC
  Filled 2023-12-09: qty 10, 5d supply, fill #0

## 2023-12-09 MED ORDER — DENOSUMAB 60 MG/ML ~~LOC~~ SOSY
60.0000 mg | PREFILLED_SYRINGE | Freq: Once | SUBCUTANEOUS | Status: AC
  Administered 2024-06-19: 60 mg via SUBCUTANEOUS

## 2023-12-09 MED ORDER — DEXTROMETHORPHAN HBR 15 MG/5ML PO SYRP
10.0000 mL | ORAL_SOLUTION | Freq: Four times a day (QID) | ORAL | 0 refills | Status: DC | PRN
  Filled 2023-12-09: qty 120, 3d supply, fill #0

## 2023-12-09 NOTE — Progress Notes (Unsigned)
SUBJECTIVE:   Chief Complaint  Patient presents with   Sinus Problem    X 1 week   HPI Presents for acute visit  PERTINENT PMH / PSH: As above  OBJECTIVE:  BP 138/80   Pulse 96   Temp 98 F (36.7 C)   Resp 18   Ht 5' (1.524 m)   Wt 190 lb (86.2 kg)   SpO2 100%   BMI 37.11 kg/m    Physical Exam Vitals reviewed.  Constitutional:      General: She is not in acute distress.    Appearance: Normal appearance. She is normal weight. She is not ill-appearing, toxic-appearing or diaphoretic.  HENT:     Right Ear: Tympanic membrane, ear canal and external ear normal.     Left Ear: Tympanic membrane, ear canal and external ear normal.     Nose:     Right Turbinates: Swollen.     Left Turbinates: Swollen.     Mouth/Throat:     Mouth: Mucous membranes are moist.     Pharynx: Posterior oropharyngeal erythema present.  Eyes:     General:        Right eye: No discharge.        Left eye: No discharge.     Conjunctiva/sclera: Conjunctivae normal.  Cardiovascular:     Rate and Rhythm: Normal rate and regular rhythm.     Heart sounds: Normal heart sounds.  Pulmonary:     Effort: Pulmonary effort is normal.     Breath sounds: Normal breath sounds.  Abdominal:     General: Bowel sounds are normal.  Musculoskeletal:        General: Normal range of motion.  Skin:    General: Skin is warm and dry.  Neurological:     General: No focal deficit present.     Mental Status: She is alert and oriented to person, place, and time. Mental status is at baseline.  Psychiatric:        Mood and Affect: Mood normal.        Behavior: Behavior normal.        Thought Content: Thought content normal.        Judgment: Judgment normal.        12/09/2023    1:08 PM 09/26/2023    1:03 PM 06/06/2023    9:35 AM 04/24/2023    8:13 AM 08/31/2022    2:55 PM  Depression screen PHQ 2/9  Decreased Interest 1 0 0 0 0  Down, Depressed, Hopeless 0 0 0 0 0  PHQ - 2 Score 1 0 0 0 0  Altered sleeping 3 0  1 0   Tired, decreased energy 1 0 1 0   Change in appetite 0 0 0 0   Feeling bad or failure about yourself  0 0 0 0   Trouble concentrating 0 0 0 0   Moving slowly or fidgety/restless 0 0 0 0   Suicidal thoughts 0 0 0 0   PHQ-9 Score 5 0 2 0   Difficult doing work/chores Not difficult at all Not difficult at all Not difficult at all Not difficult at all       12/09/2023    1:09 PM 09/26/2023    1:04 PM 06/06/2023    9:35 AM 04/24/2023    8:13 AM  GAD 7 : Generalized Anxiety Score  Nervous, Anxious, on Edge 0 0 0 0  Control/stop worrying 0 0 0 0  Worry too much -  different things 0 0 0 0  Trouble relaxing 0 0 0 0  Restless 0 0 0 0  Easily annoyed or irritable 0 0 0 0  Afraid - awful might happen 0 0 0 0  Total GAD 7 Score 0 0 0 0  Anxiety Difficulty Not difficult at all Not difficult at all Not difficult at all Not difficult at all    ASSESSMENT/PLAN:  Acute recurrent frontal sinusitis -     Amoxicillin-Pot Clavulanate; Take 1 tablet by mouth 2 (two) times daily for 5 days.  Dispense: 10 tablet; Refill: 0 -     Saccharomyces boulardii; Take 1 capsule (250 mg total) by mouth daily.  Dispense: 90 capsule; Refill: 0 -     Dextromethorphan HBr; Take 10 mLs (30 mg total) by mouth 4 (four) times daily as needed for cough.  Dispense: 120 mL; Refill: 0  Stuffy and runny nose -     POC COVID-19 BinaxNow  Allergic rhinitis, unspecified seasonality, unspecified trigger -     Fluticasone Propionate; Place 2 sprays into both nostrils daily.  Dispense: 16 g; Refill: 6   PDMP reviewed  Return if symptoms worsen or fail to improve, for PCP.  Dana Allan, MD

## 2023-12-09 NOTE — Patient Instructions (Addendum)
It was a pleasure meeting you today. Thank you for allowing me to take part in your health care.  Our goals for today as we discussed include:  Start Augmentin 1 tablet two times a day Start Probiotics daily and continue for 14 days after treatment.  Can be any over the counter probiotic Flonase 2 sprays to each nostril daily  Tylenol 325 mg every 6-8 hours for discomfort Dextromethorphan every 6 hours as needed for cough Lozenges to help sooth throat and decrease cough  Received Prolia injection today Schedule nurse appointment for next Prolia injection appointment.  Due 06/08/24  This is a list of the screening recommended for you and due dates:  Health Maintenance  Topic Date Due   Zoster (Shingles) Vaccine (1 of 2) Never done   COVID-19 Vaccine (5 - 2023-24 season) 09/01/2023   DEXA scan (bone density measurement)  10/22/2024   Mammogram  10/24/2024   Colon Cancer Screening  09/22/2026   DTaP/Tdap/Td vaccine (2 - Td or Tdap) 07/28/2028   Pap with HPV screening  09/25/2028   Hepatitis C Screening  Completed   HIV Screening  Completed   HPV Vaccine  Aged Out   Flu Shot  Discontinued     If you have any questions or concerns, please do not hesitate to call the office at [phone].  I look forward to our next visit and until then take care and stay safe.  Regards,   Dana Allan, MD   [facility]

## 2023-12-10 ENCOUNTER — Telehealth: Payer: Commercial Managed Care - PPO | Admitting: Family Medicine

## 2023-12-10 ENCOUNTER — Telehealth: Payer: Self-pay | Admitting: Family Medicine

## 2023-12-10 NOTE — Telephone Encounter (Signed)
Patient called and would like a call back regarding instructions of antibiotic that was prescribed on 12/9. Please give patient a call back .

## 2023-12-10 NOTE — Telephone Encounter (Signed)
Called pt and answer her question about when to take the probiotic.

## 2023-12-12 ENCOUNTER — Ambulatory Visit: Payer: Commercial Managed Care - PPO | Admitting: Family Medicine

## 2023-12-12 ENCOUNTER — Encounter: Payer: Self-pay | Admitting: Family Medicine

## 2023-12-12 DIAGNOSIS — J309 Allergic rhinitis, unspecified: Secondary | ICD-10-CM | POA: Insufficient documentation

## 2023-12-12 DIAGNOSIS — J3489 Other specified disorders of nose and nasal sinuses: Secondary | ICD-10-CM | POA: Insufficient documentation

## 2023-12-12 DIAGNOSIS — J0111 Acute recurrent frontal sinusitis: Secondary | ICD-10-CM | POA: Insufficient documentation

## 2023-12-12 LAB — POC COVID19 BINAXNOW: SARS Coronavirus 2 Ag: NEGATIVE

## 2023-12-12 NOTE — Assessment & Plan Note (Signed)
Prolia injection administered today by CMA Follow up in 6 months for repeat injection

## 2023-12-12 NOTE — Assessment & Plan Note (Signed)
Refill: Flonase nasal spray

## 2023-12-12 NOTE — Assessment & Plan Note (Addendum)
Persistent sinus pressure and postnasal drip causing cough for over a week. No fever or headache. Previous history of similar episodes. COVID negative -Start Augmentin for 5 days. -Continue Flonase to help dry up postnasal drip. -Start probiotics daily during antibiotic treatment and continue for 14 days after completion of antibiotics. -Trial of Dextromethorphan cough syrup to help manage symptoms.

## 2023-12-28 ENCOUNTER — Emergency Department
Admission: EM | Admit: 2023-12-28 | Discharge: 2023-12-28 | Disposition: A | Discharge status: Home / Self Care | Payer: Commercial Managed Care - PPO | Attending: Emergency Medicine | Admitting: Emergency Medicine

## 2023-12-28 ENCOUNTER — Other Ambulatory Visit: Payer: Self-pay

## 2023-12-28 DIAGNOSIS — I10 Essential (primary) hypertension: Secondary | ICD-10-CM | POA: Insufficient documentation

## 2023-12-28 DIAGNOSIS — L509 Urticaria, unspecified: Secondary | ICD-10-CM | POA: Diagnosis not present

## 2023-12-28 DIAGNOSIS — T7840XA Allergy, unspecified, initial encounter: Secondary | ICD-10-CM | POA: Diagnosis present

## 2023-12-28 MED ORDER — CYPROHEPTADINE HCL 4 MG PO TABS
4.0000 mg | ORAL_TABLET | Freq: Once | ORAL | Status: AC
  Administered 2023-12-28: 4 mg via ORAL
  Filled 2023-12-28: qty 1

## 2023-12-28 MED ORDER — PREDNISONE 20 MG PO TABS
60.0000 mg | ORAL_TABLET | Freq: Once | ORAL | Status: AC
  Administered 2023-12-28: 60 mg via ORAL
  Filled 2023-12-28: qty 3

## 2023-12-28 MED ORDER — DEXAMETHASONE SODIUM PHOSPHATE 10 MG/ML IJ SOLN
10.0000 mg | Freq: Once | INTRAMUSCULAR | Status: AC
  Administered 2023-12-28: 10 mg via INTRAMUSCULAR
  Filled 2023-12-28: qty 1

## 2023-12-28 MED ORDER — DIPHENHYDRAMINE HCL 25 MG PO CAPS
25.0000 mg | ORAL_CAPSULE | Freq: Once | ORAL | Status: AC
  Administered 2023-12-28: 25 mg via ORAL
  Filled 2023-12-28: qty 1

## 2023-12-28 MED ORDER — CYPROHEPTADINE HCL 4 MG PO TABS: 4.0000 mg | ORAL_TABLET | Freq: Three times a day (TID) | ORAL | 0 refills | Status: DC | PRN

## 2023-12-28 MED ORDER — FAMOTIDINE 20 MG PO TABS: 20.0000 mg | ORAL_TABLET | Freq: Two times a day (BID) | ORAL | 0 refills | Status: DC

## 2023-12-28 MED ORDER — PREDNISONE 20 MG PO TABS: 40.0000 mg | ORAL_TABLET | Freq: Every day | ORAL | 0 refills | Status: AC

## 2023-12-28 MED ORDER — FAMOTIDINE 20 MG PO TABS
40.0000 mg | ORAL_TABLET | Freq: Once | ORAL | Status: AC
  Administered 2023-12-28: 40 mg via ORAL
  Filled 2023-12-28: qty 2

## 2023-12-28 NOTE — ED Provider Notes (Signed)
[facility] Emergency Department Provider Note     Event Date/Time   First MD Initiated Contact with Patient 12/28/23 2024     (approximate)   History   Allergic Reaction   HPI  Joyce Simpson is a 62 y.o. female with a history of obesity, hypertension, HLD, and osteoporosis who presents to the ED for evaluation of allergic reaction to unknown source.  Patient awoke around 10 AM with generalized itching and noted hives to the trunk and extremities.  She denies any difficulty breathing, swallowing, controlling oral secretions.  She also denied any involvement of her lips, tongue, or throat.  Patient took a dose of a Benadryl elixir she had on hand at home about 2 PM with no benefit.  She presents to the ED for evaluation of her symptoms.  Denies any history of food or environmental allergies.   Physical Exam   Triage Vital Signs: ED Triage Vitals  Encounter Vitals Group     BP 12/28/23 1816 (!) 150/88     Systolic BP Percentile --      Diastolic BP Percentile --      Pulse Rate 12/28/23 1816 74     Resp 12/28/23 1816 16     Temp 12/28/23 1816 97.8 F (36.6 C)     Temp Source 12/28/23 1816 Oral     SpO2 12/28/23 1816 100 %     Weight --      Height --      Head Circumference --      Peak Flow --      Pain Score 12/28/23 1815 0     Pain Loc --      Pain Education --      Exclude from Growth Chart --     Most recent vital signs: Vitals:   12/28/23 1816  BP: (!) 150/88  Pulse: 74  Resp: 16  Temp: 97.8 F (36.6 C)  SpO2: 100%    General Awake, no distress. NAD HEENT NCAT. PERRL. EOMI. No rhinorrhea. Mucous membranes are moist.  Uvula is midline tonsils are flat.  No oropharyngeal lesions.  No uvulitis or angioedema appreciated. CV:  Good peripheral perfusion. RRR RESP:  Normal effort. CTA ABD:  No distention.  SKIN:  Patient with generalized whelps and hives over the trunk and extremities consistent with urticaria.   ED Results /  Procedures / Treatments   Labs (all labs ordered are listed, but only abnormal results are displayed) Labs Reviewed - No data to display   EKG  RADIOLOGY  No results found.   PROCEDURES:  Critical Care performed: No  Procedures   MEDICATIONS ORDERED IN ED: Medications  diphenhydrAMINE (BENADRYL) capsule 25 mg (25 mg Oral Given 12/28/23 1827)  famotidine (PEPCID) tablet 40 mg (40 mg Oral Given 12/28/23 1827)  predniSONE (DELTASONE) tablet 60 mg (60 mg Oral Given 12/28/23 1827)  dexamethasone (DECADRON) injection 10 mg (10 mg Intramuscular Given 12/28/23 2058)  cyproheptadine (PERIACTIN) 4 MG tablet 4 mg (4 mg Oral Given 12/28/23 2113)     IMPRESSION / MDM / ASSESSMENT AND PLAN / ED COURSE  I reviewed the triage vital signs and the nursing notes.                              Differential diagnosis includes, but is not limited to, angioedema, anaphylaxis, contact dermatitis, food sensitivity, drug reaction  Patient's presentation is most consistent with acute,  uncomplicated illness.  Patient's diagnosis is consistent with urticaria of an unclear etiology.  Patient presents with generalized hives to trunk and extremities.  No evidence of angioedema or anaphylaxis on exam presentation.  Patient will be discharged home with prescriptions for Cipro hepta Dean, famotidine, and prednisone. Patient is to follow up with PCP as discussed, as needed or otherwise directed. Patient is given ED precautions to return to the ED for any worsening or new symptoms.     FINAL CLINICAL IMPRESSION(S) / ED DIAGNOSES   Final diagnoses:  Urticaria     Rx / DC Orders   ED Discharge Orders          Ordered    cyproheptadine (PERIACTIN) 4 MG tablet  3 times daily PRN        12/28/23 2208    famotidine (PEPCID) 20 MG tablet  2 times daily        12/28/23 2208    predniSONE (DELTASONE) 20 MG tablet  Daily with breakfast        12/28/23 2208             Note:  This document was  prepared using Dragon voice recognition software and may include unintentional dictation errors.    Lissa Hoard, PA-C 12/28/23 2209    Trinna Post, MD 12/28/23 [phone]

## 2023-12-28 NOTE — ED Provider Triage Note (Signed)
Emergency Medicine Provider Triage Evaluation Note  Joyce Simpson , a 62 y.o. female  was evaluated in triage.  Pt complains of allergic reaction, she switched laundry detergent. Now she has hives on her back and abdomen. No tongue or lip swelling.   Review of Systems  Positive: itchy Negative: Tongue swelling, lip swelling. Difficulty breathing, nausea, vomiting  Physical Exam  BP (!) 150/88   Pulse 74   Temp 97.8 F (36.6 C) (Oral)   Resp 16   SpO2 100%  Gen:   Awake, no distress   Resp:  Normal effort  MSK:   Moves extremities without difficulty  Other:    Medical Decision Making  Medically screening exam initiated at 6:24 PM.  Appropriate orders placed.  [name] was informed that the remainder of the evaluation will be completed by another provider, this initial triage assessment does not replace that evaluation, and the importance of remaining in the ED until their evaluation is complete.  Benadryl, famotidine and prednisone ordered while in triage.   Cameron Ali, PA-C 12/28/23 1825

## 2023-12-28 NOTE — ED Triage Notes (Addendum)
Pt to ed from home via POV for allergic reaction to unknown substance. Pt advised she woke up around 10AM itching and hives. Pt has no airway involvement. Pt has taken PO benadryl at home with no relief once at 2pm today. Pt is caox4, in no acute distress and ambulatory in triage. Pt switched laundry detergents in the last 7 days from tide to gain.

## 2023-12-28 NOTE — Discharge Instructions (Signed)
Your exam shows hives/urticaria.  This source or trigger for your symptoms is not known at this time.  Take the prescription meds as directed.  Avoid any long hot steamy showers.  Also avoid getting overheated or sweaty.  Continue to hydrate as appropriate.  Return to the ED if necessary.

## 2023-12-30 ENCOUNTER — Ambulatory Visit: Payer: Self-pay | Admitting: Family Medicine

## 2023-12-30 NOTE — Telephone Encounter (Signed)
   Chief Complaint: itching Symptoms: redness, itching, indigestion Frequency: since Saturday morning Pertinent Negatives: Patient denies fever, hives, facial swelling Disposition: [] ED /[] Urgent Care (no appt availability in office) / [x] Appointment(In office/virtual)/ []  [place]/ [] Home Care/ [] Refused Recommended Disposition /[] [place] [facility]/ []  Follow-up with PCP Additional Notes: Patient reports she has been experiencing severe itching of her hands, back, side, and neck since Saturday. Patient reports she does not have hives or a rash but that there is redness in those areas from where she is scratching so much. Patient reports the only other symptom she is experiencing is indigestion. Patient was seen on Saturday in the ED for itching and was prescribed medication at that time but states that it is not helping and she is still having severe intermittent itching. Per protocol, appt scheduled 12/21. Patient advised to call back with worsening symptoms. Patient verbalized understanding.    Copied from CRM [id number]. Topic: Clinical - Red Word Triage >> Dec 30, 2023 10:20 AM Almira Coaster wrote: Red Word that prompted transfer to Nurse Triage: Patient is having a reaction but does not know what's causing Itchiness and indigestion. Since Saturday morning. Reason for Disposition  SEVERE itching (i.e., interferes with sleep, normal activities or school)  Answer Assessment - Initial Assessment Questions 1. APPEARANCE of RASH: "Describe the rash." (e.g., spots, blisters, raised areas, skin peeling, scaly)     Red raised 2. SIZE: "How big are the spots?" (e.g., tip of pen, eraser, coin; inches, centimeters)     Mainly red where I am scratchintg 3. LOCATION: "Where is the rash located?"     Hands, back, stomach, and neck 4. COLOR: "What color is the rash?" (Note: It is difficult to assess rash color in people with darker-colored skin. When this situation occurs, simply ask the  caller to describe what they see.)     red 5. ONSET: "When did the rash begin?"     Saturday morning 6. FEVER: "Do you have a fever?" If Yes, ask: "What is your temperature, how was it measured, and when did it start?"     none 7. ITCHING: "Does the rash itch?" If Yes, ask: "How bad is the itch?" (Scale 1-10; or mild, moderate, severe)     severe 8. CAUSE: "What do you think is causing the rash?"     I'm not sure, I started a new detergent 3 weeks ago 9. MEDICINE FACTORS: "Have you started any new medicines within the last 2 weeks?" (e.g., antibiotics)      none 10. OTHER SYMPTOMS: "Do you have any other symptoms?" (e.g., dizziness, headache, sore throat, joint pain)       Indigestion  Protocols used: Rash or Redness - [facility]

## 2023-12-31 ENCOUNTER — Ambulatory Visit: Payer: Commercial Managed Care - PPO | Admitting: Nurse Practitioner

## 2023-12-31 VITALS — BP 148/90 | HR 56 | Temp 97.8°F | Ht 60.0 in | Wt 196.2 lb

## 2023-12-31 DIAGNOSIS — L509 Urticaria, unspecified: Secondary | ICD-10-CM | POA: Diagnosis not present

## 2023-12-31 LAB — CBC WITH DIFFERENTIAL/PLATELET
Basophils Absolute: 0.1 10*3/uL (ref 0.0–0.1)
Basophils Relative: 0.6 % (ref 0.0–3.0)
Eosinophils Absolute: 0 10*3/uL (ref 0.0–0.7)
Eosinophils Relative: 0.2 % (ref 0.0–5.0)
HCT: 41.8 % (ref 36.0–46.0)
Hemoglobin: 13.9 g/dL (ref 12.0–15.0)
Lymphocytes Relative: 26.5 % (ref 12.0–46.0)
Lymphs Abs: 2.6 10*3/uL (ref 0.7–4.0)
MCHC: 33.3 g/dL (ref 30.0–36.0)
MCV: 85.3 fL (ref 78.0–100.0)
Monocytes Absolute: 0.8 10*3/uL (ref 0.1–1.0)
Monocytes Relative: 8.4 % (ref 3.0–12.0)
Neutro Abs: 6.2 10*3/uL (ref 1.4–7.7)
Neutrophils Relative %: 64.3 % (ref 43.0–77.0)
Platelets: 252 10*3/uL (ref 150.0–400.0)
RBC: 4.9 Mil/uL (ref 3.87–5.11)
RDW: 13.5 % (ref 11.5–15.5)
WBC: 9.7 10*3/uL (ref 4.0–10.5)

## 2023-12-31 LAB — COMPREHENSIVE METABOLIC PANEL
ALT: 17 U/L (ref 0–35)
AST: 14 U/L (ref 0–37)
Albumin: 4 g/dL (ref 3.5–5.2)
Alkaline Phosphatase: 69 U/L (ref 39–117)
BUN: 15 mg/dL (ref 6–23)
CO2: 29 meq/L (ref 19–32)
Calcium: 9 mg/dL (ref 8.4–10.5)
Chloride: 105 meq/L (ref 96–112)
Creatinine, Ser: 0.82 mg/dL (ref 0.40–1.20)
GFR: 76.35 mL/min (ref >60.00)
Glucose, Bld: 90 mg/dL (ref 70–99)
Potassium: 3.5 meq/L (ref 3.5–5.1)
Sodium: 142 meq/L (ref 135–145)
Total Bilirubin: 0.4 mg/dL (ref 0.2–1.2)
Total Protein: 6.3 g/dL (ref 6.0–8.3)

## 2023-12-31 LAB — SEDIMENTATION RATE: Sed Rate: 8 mm/h (ref 0–30)

## 2023-12-31 LAB — C-REACTIVE PROTEIN: CRP: <1 mg/dL (ref 0.5–20.0)

## 2023-12-31 LAB — TSH: TSH: 1.08 u[IU]/mL (ref 0.35–5.50)

## 2023-12-31 NOTE — Progress Notes (Signed)
 Leron Glance, NP-C Phone: [phone]  Joyce Simpson is a 62 y.o. female who presents today for itching.   Discussed the use of AI scribe software for clinical note transcription with the patient, who gave verbal consent to proceed.  History of Present Illness   The patient presented with a severe rash that started on a Saturday morning. The rash, described as hives, was primarily located on the trunk of the body. Despite taking a dose of Benadryl , the patient found no relief and subsequently visited the ER. The ER administered an injection of steroids, Benadryl , prednisone , and famotidine . However, the patient continues to experience flare-ups of the rash, particularly on the hands, under the breasts, and on the neck. The patient also reported itching in these areas.  The patient started taking prednisone  and cyproheptadine , as prescribed by the ER, the day after the ER visit. The patient has been compliant with the medication regimen, which includes taking cyproheptadine  three times a day and prednisone  two tablets daily for five days.  The patient has not identified any changes in their environment or lifestyle that could have triggered the rash. They reported a change in laundry detergent from Gain to Tide two weeks prior to the onset of the rash, but then switched back to Gain which they had used for years without any adverse reactions. The patient has not changed their body wash, lotion, or diet, and they have not had any new exposures to animals or foods.  The rash appears to come and go, with flare-ups occurring on the trunk of the body, under the breasts, and on the neck. The patient has not experienced any swelling of the throat or itchy mouth. The patient expressed interest in having blood work done to rule out any underlying conditions that could be causing the rash.      Social History   Tobacco Use  Smoking Status Never  Smokeless Tobacco Never    Current Outpatient  Medications on File Prior to Visit  Medication Sig Dispense Refill   amLODipine  (NORVASC ) 5 MG tablet Take 1 tablet (5 mg total) by mouth daily. 90 tablet 3   Cholecalciferol  (VITAMIN D3) 25 MCG (1000 UT) CAPS Take 1 capsule (1,000 Units total) by mouth daily.     cyproheptadine  (PERIACTIN ) 4 MG tablet Take 1 tablet (4 mg total) by mouth 3 (three) times daily as needed for allergies. 30 tablet 0   famotidine  (PEPCID ) 20 MG tablet Take 1 tablet (20 mg total) by mouth 2 (two) times daily for 10 days. 20 tablet 0   fluticasone  (FLONASE ) 50 MCG/ACT nasal spray Place 2 sprays into both nostrils daily. 16 g 6   losartan -hydrochlorothiazide  (HYZAAR ) 50-12.5 MG tablet Take 1 tablet by mouth every morning. 90 tablet 3   pravastatin  (PRAVACHOL ) 20 MG tablet Take 1 tablet (20 mg total) by mouth daily. 90 tablet 3   predniSONE  (DELTASONE ) 20 MG tablet Take 2 tablets (40 mg total) by mouth daily with breakfast for 5 days. 10 tablet 0   saccharomyces boulardii (FLORASTOR) 250 MG capsule Take 1 capsule (250 mg total) by mouth daily. 90 capsule 0   Vitamin D , Ergocalciferol , (DRISDOL ) 1.25 MG (50000 UNIT) CAPS capsule Take 1 capsule (50,000 Units total) by mouth every 7 (seven) days. 12 capsule 1   Current Facility-Administered Medications on File Prior to Visit  Medication Dose Route Frequency Provider Last Rate Last Admin   [START ON 06/08/2024] denosumab  (PROLIA ) injection 60 mg  60 mg Subcutaneous Once Walsh, Tanya,  MD         ROS see history of present illness  Objective  Physical Exam Vitals:   12/31/23 0810 12/31/23 0824  BP: (!) 146/88 (!) 148/90  Pulse: (!) 56   Temp: 97.8 F (36.6 C)   SpO2: 98%     BP Readings from Last 3 Encounters:  12/31/23 (!) 148/90  12/28/23 (!) 150/88  12/09/23 138/80   Wt Readings from Last 3 Encounters:  12/31/23 196 lb 3.2 oz (89 kg)  12/09/23 190 lb (86.2 kg)  09/26/23 196 lb 6 oz (89.1 kg)    Physical Exam Constitutional:      General: She is not in  acute distress.    Appearance: Normal appearance.  HENT:     Head: Normocephalic.  Cardiovascular:     Rate and Rhythm: Normal rate and regular rhythm.     Heart sounds: Normal heart sounds.  Pulmonary:     Effort: Pulmonary effort is normal.     Breath sounds: Normal breath sounds.  Skin:    General: Skin is warm and dry.     Findings: Rash present. Rash is urticarial.     Comments: One hive noted under left breast  Neurological:     General: No focal deficit present.     Mental Status: She is alert.  Psychiatric:        Mood and Affect: Mood normal.        Behavior: Behavior normal.    Assessment/Plan: Please see individual problem list.  Urticaria Assessment & Plan: They presented with generalized hives, primarily on the trunk, accompanied by severe itching. One hive noted on exam under left breast. Despite no known triggers or changes in exposures, they have seen some improvement on prednisone , famotidine , and cyproheptadine , yet continue to experience flare-ups. We will continue prednisone , famotidine , and cyproheptadine  as prescribed. Blood work will be ordered to rule out systemic causes. Should symptoms persist after completion of current medications, we will consider a referral to an allergist. Return precautions given to patient.   Orders: -     CBC with Differential/Platelet -     Comprehensive metabolic panel -     TSH -     Sedimentation rate -     C-reactive protein    Return if symptoms worsen or fail to improve.   Leron Glance, NP-C [facility]

## 2023-12-31 NOTE — Assessment & Plan Note (Signed)
 They presented with generalized hives, primarily on the trunk, accompanied by severe itching. One hive noted on exam under left breast. Despite no known triggers or changes in exposures, they have seen some improvement on prednisone , famotidine , and cyproheptadine , yet continue to experience flare-ups. We will continue prednisone , famotidine , and cyproheptadine  as prescribed. Blood work will be ordered to rule out systemic causes. Should symptoms persist after completion of current medications, we will consider a referral to an allergist. Return precautions given to patient.

## 2024-02-25 MED FILL — Losartan Potassium & Hydrochlorothiazide Tab 50-12.5 MG: ORAL | 90 days supply | Qty: 90 | Fill #1 | Status: AC

## 2024-02-28 ENCOUNTER — Other Ambulatory Visit: Payer: Self-pay

## 2024-02-28 DIAGNOSIS — M25511 Pain in right shoulder: Secondary | ICD-10-CM | POA: Diagnosis not present

## 2024-02-28 MED ORDER — MELOXICAM 15 MG PO TABS
15.0000 mg | ORAL_TABLET | Freq: Every day | ORAL | 1 refills | Status: DC
  Filled 2024-02-28: qty 30, 30d supply, fill #0

## 2024-04-29 ENCOUNTER — Other Ambulatory Visit (HOSPITAL_COMMUNITY): Payer: Self-pay

## 2024-05-06 ENCOUNTER — Telehealth: Payer: Self-pay

## 2024-05-06 ENCOUNTER — Other Ambulatory Visit (HOSPITAL_COMMUNITY): Payer: Self-pay

## 2024-05-06 NOTE — Telephone Encounter (Signed)
 Pharmacy Patient Advocate Encounter   Received notification from CoverMyMeds that prior authorization for PROLIA  is required/requested.   Insurance verification completed.   The patient is insured through [facility] .   Per test claim: PA required; PA submitted to above mentioned insurance via CoverMyMeds Key/confirmation #/EOC [id number] Status is pending    *PHARMACY BENEFIT*

## 2024-05-06 NOTE — Telephone Encounter (Signed)
Prolia VOB initiated via [email or web address]  Next Prolia inj DUE:

## 2024-05-08 ENCOUNTER — Other Ambulatory Visit (HOSPITAL_COMMUNITY): Payer: Self-pay

## 2024-05-08 NOTE — Telephone Encounter (Signed)
    PHARMACY COPAY: $750

## 2024-05-11 ENCOUNTER — Telehealth: Payer: Self-pay

## 2024-05-11 NOTE — Telephone Encounter (Signed)
 Copied from CRM [id number]. Topic: Appointments - Transfer of Care >> May 11, 2024 11:18 AM [name] wrote: Pt is requesting to transfer FROM: Joyce Simpson Pt is requesting to transfer TO: Bluford Burkitt Reason for requested transfer: [name] leaving It is the responsibility of the team the patient would like to transfer to (Dr. Julietta Ogren) to reach out to the patient if for any reason this transfer is not acceptable.  I spoke with patient and scheduled her for a TOC appointment with Bluford Burkitt, NP.

## 2024-05-11 NOTE — Telephone Encounter (Signed)
 Copied from CRM [id number]. Topic: Appointments - Scheduling Inquiry for Clinic >> May 11, 2024 11:05 AM Albertha Alosa wrote: Reason for CRM: Patient called in regarding scheduling for her prolia  shot , would like someone to give her a callback so she can be scheduled  I scheduled patient for a Prolia  injection (last injection was 12/09/2023).

## 2024-05-13 NOTE — Telephone Encounter (Signed)
 Joyce Simpson

## 2024-05-13 NOTE — Telephone Encounter (Signed)
 PA for buy and bill submitted via [employer]. Authorization Number: [id number]

## 2024-05-14 NOTE — Telephone Encounter (Signed)
 Pt ready for scheduling for PROLIA  on or after : 06/08/24  Option# 1: Buy/Bill (Office supplied medication)  Out-of-pocket cost due at time of clinic visit: $83.29  Number of injection/visits approved: 2  Primary: [employer] Prolia  co-insurance: $50 Admin fee co-insurance: 0%  Secondary: --- Prolia  co-insurance:  Admin fee co-insurance:   Medical Benefit Details: Date Benefits were checked: 05/13/24 Deductible: $266.71 Met of $300 Required/ Coinsurance: $50/ Admin Fee: 0%  Prior Auth: APPROVED PA# [id number]  Expiration Date: 06/08/24-06/07/25  # of doses approved: 2 ----------------------------------------------------------------------- Option# 2- Med Obtained from pharmacy:  Pharmacy benefit: Copay $750 (Paid to pharmacy) Admin Fee: 0% (Pay at clinic)  Prior Auth: APPROVED PA# Expiration Date: 05/06/24-05/05/25  # of doses approved: 2   If patient wants fill through the pharmacy benefit please send prescription to: [employer], and include estimated need by date in rx notes. Pharmacy will ship medication directly to the office.  Patient IS eligible for Prolia  Copay Card. Copay Card can make patient's cost as little as $25. Link to apply: https://www.amgensupportplus.com/copay  ** This summary of benefits is an estimation of the patient's out-of-pocket cost. Exact cost may very based on individual plan coverage.

## 2024-05-28 ENCOUNTER — Other Ambulatory Visit: Payer: Self-pay | Admitting: *Deleted

## 2024-05-28 ENCOUNTER — Telehealth: Payer: Self-pay

## 2024-05-28 DIAGNOSIS — M810 Age-related osteoporosis without current pathological fracture: Secondary | ICD-10-CM

## 2024-05-28 DIAGNOSIS — E559 Vitamin D deficiency, unspecified: Secondary | ICD-10-CM

## 2024-05-28 NOTE — Telephone Encounter (Signed)
 Copied from CRM [id number]. Topic: General - Other >> May 28, 2024  4:26 PM Joyce Simpson C wrote: Reason for CRM: Patient called in regarding missed call from [facility], relay message per note on what she will owe for Prolia  shot, patient stated she never pays and would like to know why she has to pay, would like for someone to contact the insurance on why she has to pay

## 2024-05-28 NOTE — Telephone Encounter (Signed)
 Left voicemail for pt to return call to discuss amount due for Prolia .   Pt will owe $83.29 & will need a Vit D lab drawn prior to injection.  Pt is already scheduled for injection on: 06/15/24 @ 8:45am

## 2024-05-28 NOTE — Telephone Encounter (Signed)
 Left voicemail to discuss amount due. Pt already scheduled for 06/15/24. See other note for more details

## 2024-05-28 NOTE — Telephone Encounter (Signed)
 Pt notified that she paid $50 in December and the cost for Prolia  went up this year. Pt okay with this and is aware to have a Vit D lab drawn before her injection

## 2024-06-15 ENCOUNTER — Ambulatory Visit

## 2024-06-15 MED FILL — Losartan Potassium & Hydrochlorothiazide Tab 50-12.5 MG: ORAL | 90 days supply | Qty: 90 | Fill #2 | Status: AC

## 2024-06-19 ENCOUNTER — Ambulatory Visit

## 2024-06-19 DIAGNOSIS — M810 Age-related osteoporosis without current pathological fracture: Secondary | ICD-10-CM | POA: Diagnosis not present

## 2024-06-19 DIAGNOSIS — E559 Vitamin D deficiency, unspecified: Secondary | ICD-10-CM

## 2024-06-19 MED ORDER — DENOSUMAB 60 MG/ML ~~LOC~~ SOSY
60.0000 mg | PREFILLED_SYRINGE | Freq: Once | SUBCUTANEOUS | Status: AC
  Administered 2025-01-04: 60 mg via SUBCUTANEOUS

## 2024-06-19 NOTE — Progress Notes (Signed)
 After obtaining consent, and per orders of Dr.Walsh, MD injection of Prolia  given by Virgina Grills, CMA in the L Arm (SubQ). Patient tolerated injection well.

## 2024-06-20 LAB — VITAMIN D 25 HYDROXY (VIT D DEFICIENCY, FRACTURES): Vit D, 25-Hydroxy: 28.4 ng/mL — ABNORMAL LOW (ref 30.0–100.0)

## 2024-06-22 ENCOUNTER — Other Ambulatory Visit: Payer: Self-pay

## 2024-06-22 ENCOUNTER — Ambulatory Visit: Payer: Self-pay | Admitting: Family Medicine

## 2024-06-22 DIAGNOSIS — E559 Vitamin D deficiency, unspecified: Secondary | ICD-10-CM

## 2024-06-22 MED ORDER — VITAMIN D (ERGOCALCIFEROL) 1.25 MG (50000 UNIT) PO CAPS
50000.0000 [IU] | ORAL_CAPSULE | ORAL | 1 refills | Status: AC
  Filled 2024-06-22: qty 12, 84d supply, fill #0
  Filled 2024-09-07: qty 12, 84d supply, fill #1

## 2024-08-06 ENCOUNTER — Ambulatory Visit: Admitting: Nurse Practitioner

## 2024-08-06 VITALS — BP 138/80 | HR 66 | Temp 97.6°F | Ht 60.0 in | Wt 196.0 lb

## 2024-08-06 DIAGNOSIS — I10 Essential (primary) hypertension: Secondary | ICD-10-CM | POA: Diagnosis not present

## 2024-08-06 DIAGNOSIS — M810 Age-related osteoporosis without current pathological fracture: Secondary | ICD-10-CM | POA: Diagnosis not present

## 2024-08-06 DIAGNOSIS — E785 Hyperlipidemia, unspecified: Secondary | ICD-10-CM

## 2024-08-06 DIAGNOSIS — R202 Paresthesia of skin: Secondary | ICD-10-CM

## 2024-08-06 DIAGNOSIS — R200 Anesthesia of skin: Secondary | ICD-10-CM | POA: Diagnosis not present

## 2024-08-06 DIAGNOSIS — R7303 Prediabetes: Secondary | ICD-10-CM | POA: Diagnosis not present

## 2024-08-06 NOTE — Progress Notes (Signed)
 Leron Glance, NP-C Phone: [phone]  Joyce Simpson is a 63 y.o. female who presents today for transfer of care.   Discussed the use of AI scribe software for clinical note transcription with the patient, who gave verbal consent to proceed.  History of Present Illness   Joyce Simpson is a 63 year old female with carpal tunnel syndrome who presents for transfer of care with numbness in her fingertips.  She experiences persistent numbness localized to the tips of her fingers. The numbness does not affect her entire hand. She has been increasing her water intake due to difficulty staying hydrated.  She is currently taking amlodipine  and losartan  hydrochlorothiazide  for hypertension, which has been stable despite not regularly checking her blood pressure at home. She previously took pravastatin  for cholesterol management but has not been on it recently. She has a history of prediabetes, which she was unaware of until today. Her diet and exercise routine have been inconsistent, particularly due to weather conditions, and she plans to resume walking as the weather improves.  Her family history is significant for diabetes, as her husband and daughter are both diabetic. Her daughter has been diabetic since age six and uses an insulin pump, while her husband manages his diabetes with oral medication.  She has a history of osteoporosis and receives Prolia  injections. She takes a vitamin D  supplement once a week, which she recently refilled.  She mentions a past issue with a skin reaction under her arms, which she attributes to a change in deodorant. The condition improved after switching to a different product.  No chest pain, shortness of breath, dizziness, or swelling.      Social History   Tobacco Use  Smoking Status Never  Smokeless Tobacco Never    Current Outpatient Medications on File Prior to Visit  Medication Sig Dispense Refill   amLODipine  (NORVASC ) 5 MG tablet Take 1 tablet  (5 mg total) by mouth daily. 90 tablet 3   losartan -hydrochlorothiazide  (HYZAAR ) 50-12.5 MG tablet Take 1 tablet by mouth every morning. 90 tablet 3   Vitamin D , Ergocalciferol , (DRISDOL ) 1.25 MG (50000 UNIT) CAPS capsule Take 1 capsule (50,000 Units total) by mouth every 7 (seven) days. 12 capsule 1   Current Facility-Administered Medications on File Prior to Visit  Medication Dose Route Frequency Provider Last Rate Last Admin   [START ON 12/19/2024] denosumab  (PROLIA ) injection 60 mg  60 mg Subcutaneous Once Joyce Simpson Leron, NP         ROS see history of present illness  Objective  Physical Exam Vitals:   08/06/24 1520  BP: 138/80  Pulse: 66  Temp: 97.6 F (36.4 C)  SpO2: 99%    BP Readings from Last 3 Encounters:  08/06/24 138/80  12/31/23 (!) 148/90  12/28/23 (!) 150/88   Wt Readings from Last 3 Encounters:  08/06/24 196 lb (88.9 kg)  12/31/23 196 lb 3.2 oz (89 kg)  12/09/23 190 lb (86.2 kg)    Physical Exam Constitutional:      General: She is not in acute distress.    Appearance: Normal appearance.  HENT:     Head: Normocephalic.  Cardiovascular:     Rate and Rhythm: Normal rate and regular rhythm.     Heart sounds: Normal heart sounds.  Pulmonary:     Effort: Pulmonary effort is normal.     Breath sounds: Normal breath sounds.  Skin:    General: Skin is warm and dry.  Neurological:     General: No  focal deficit present.     Mental Status: She is alert.  Psychiatric:        Mood and Affect: Mood normal.        Behavior: Behavior normal.      Assessment/Plan: Please see individual problem list.  Numbness and tingling in both hands Assessment & Plan: Chronic issue. Hx of CTS. Check lab work as outlined. Further work up pending results. Consider referral to Neurology.   Orders: -     TSH -     Vitamin B12 -     IBC + Ferritin  Essential hypertension Assessment & Plan: Blood pressure is well-controlled with amlodipine  5 mg daily and losartan  -  hydrochlorothiazide  50- 12.5 mg daily. No symptoms reported. Continue current medication regimen.   Orders: -     CBC with Differential/Platelet -     Comprehensive metabolic panel with GFR  Hyperlipidemia, unspecified hyperlipidemia type Assessment & Plan: She is not taking pravastatin . Discussed the importance of resuming pravastatin  to reduce cardiovascular risk. Check lipid panel.   Orders: -     Lipid panel  Prediabetes Assessment & Plan: Discussed diet and exercise for blood sugar management. Plan to check A1c. Advised on reducing carbohydrate intake and encouraged resumption of exercise, such as walking.  Orders: -     Hemoglobin A1c  Osteoporosis, unspecified osteoporosis type, unspecified pathological fracture presence Assessment & Plan: She is receiving Prolia  injections and taking vitamin D . Discussed the importance of weight-bearing exercises. Continue Prolia  injections and vitamin D  supplementation. Encourage weight-bearing exercises, such as walking.      Return in about 6 months (around 02/06/2025) for Follow up.   Leron Glance, NP-C [facility]

## 2024-08-07 ENCOUNTER — Other Ambulatory Visit: Payer: Self-pay

## 2024-08-07 LAB — COMPREHENSIVE METABOLIC PANEL WITH GFR
ALT: 12 U/L (ref 0–35)
AST: 13 U/L (ref 0–37)
Albumin: 4.3 g/dL (ref 3.5–5.2)
Alkaline Phosphatase: 76 U/L (ref 39–117)
BUN: 10 mg/dL (ref 6–23)
CO2: 30 meq/L (ref 19–32)
Calcium: 9.3 mg/dL (ref 8.4–10.5)
Chloride: 101 meq/L (ref 96–112)
Creatinine, Ser: 0.75 mg/dL (ref 0.40–1.20)
GFR: 84.62 mL/min (ref >60.00)
Glucose, Bld: 80 mg/dL (ref 70–99)
Potassium: 3.7 meq/L (ref 3.5–5.1)
Sodium: 140 meq/L (ref 135–145)
Total Bilirubin: 0.3 mg/dL (ref 0.2–1.2)
Total Protein: 6.8 g/dL (ref 6.0–8.3)

## 2024-08-07 LAB — CBC WITH DIFFERENTIAL/PLATELET
Basophils Absolute: 0 K/uL (ref 0.0–0.1)
Basophils Relative: 0.9 % (ref 0.0–3.0)
Eosinophils Absolute: 0.1 K/uL (ref 0.0–0.7)
Eosinophils Relative: 2.1 % (ref 0.0–5.0)
HCT: 41.7 % (ref 36.0–46.0)
Hemoglobin: 13.8 g/dL (ref 12.0–15.0)
Lymphocytes Relative: 40.1 % (ref 12.0–46.0)
Lymphs Abs: 2.1 K/uL (ref 0.7–4.0)
MCHC: 33.1 g/dL (ref 30.0–36.0)
MCV: 83.8 fl (ref 78.0–100.0)
Monocytes Absolute: 0.6 K/uL (ref 0.1–1.0)
Monocytes Relative: 10.5 % (ref 3.0–12.0)
Neutro Abs: 2.4 K/uL (ref 1.4–7.7)
Neutrophils Relative %: 46.4 % (ref 43.0–77.0)
Platelets: 252 K/uL (ref 150.0–400.0)
RBC: 4.98 Mil/uL (ref 3.87–5.11)
RDW: 13.6 % (ref 11.5–15.5)
WBC: 5.3 K/uL (ref 4.0–10.5)

## 2024-08-07 LAB — IBC + FERRITIN
Ferritin: 110.8 ng/mL (ref 10.0–291.0)
Iron: 59 ug/dL (ref 42–145)
Saturation Ratios: 18.1 % — ABNORMAL LOW (ref 20.0–50.0)
TIBC: 326.2 ug/dL (ref 250.0–450.0)
Transferrin: 233 mg/dL (ref 212.0–360.0)

## 2024-08-07 LAB — LIPID PANEL
Cholesterol: 192 mg/dL (ref 0–200)
HDL: 58.5 mg/dL (ref >39.00)
LDL Cholesterol: 120 mg/dL — ABNORMAL HIGH (ref 0–99)
NonHDL: 133.64
Total CHOL/HDL Ratio: 3
Triglycerides: 67 mg/dL (ref 0.0–149.0)
VLDL: 13.4 mg/dL (ref 0.0–40.0)

## 2024-08-07 LAB — TSH: TSH: 1.14 u[IU]/mL (ref 0.35–5.50)

## 2024-08-07 LAB — VITAMIN B12: Vitamin B-12: 390 pg/mL (ref 211–911)

## 2024-08-07 LAB — HEMOGLOBIN A1C: Hgb A1c MFr Bld: 5.9 % (ref 4.6–6.5)

## 2024-08-12 ENCOUNTER — Telehealth: Payer: Self-pay

## 2024-08-12 ENCOUNTER — Other Ambulatory Visit: Payer: Self-pay

## 2024-08-12 ENCOUNTER — Other Ambulatory Visit: Payer: Self-pay | Admitting: Nurse Practitioner

## 2024-08-12 ENCOUNTER — Ambulatory Visit: Payer: Self-pay | Admitting: Nurse Practitioner

## 2024-08-12 DIAGNOSIS — E785 Hyperlipidemia, unspecified: Secondary | ICD-10-CM

## 2024-08-12 MED ORDER — EZETIMIBE 10 MG PO TABS
10.0000 mg | ORAL_TABLET | Freq: Every day | ORAL | 3 refills | Status: AC
  Filled 2024-08-12 (×2): qty 90, 90d supply, fill #0

## 2024-08-12 NOTE — Telephone Encounter (Signed)
 Copied from CRM [id number]. Topic: Clinical - Lab/Test Results >> Aug 12, 2024  3:04 PM Jayma L wrote: Reason for CRM: patient asking for a callback about labs from 08/06/2024 , callback is [phone]

## 2024-08-12 NOTE — Telephone Encounter (Signed)
 Copied from CRM [id number]. Topic: Clinical - Medication Question >> Aug 12, 2024  2:59 PM Jayma L wrote: Reason for CRM: patient was seen 08/06/2024 and said a new medicine was gonna be called in, asking for a update to know the name of the medicine and find out if it was already sent in . Please reach out to patient and let her know, she's unsure of the name of the medicine .

## 2024-08-24 ENCOUNTER — Encounter: Payer: Self-pay | Admitting: Nurse Practitioner

## 2024-08-24 NOTE — Assessment & Plan Note (Signed)
 Chronic issue. Hx of CTS. Check lab work as outlined. Further work up pending results. Consider referral to Neurology.

## 2024-08-24 NOTE — Assessment & Plan Note (Signed)
 She is not taking pravastatin . Discussed the importance of resuming pravastatin  to reduce cardiovascular risk. Check lipid panel.

## 2024-08-24 NOTE — Assessment & Plan Note (Signed)
 She is receiving Prolia  injections and taking vitamin D . Discussed the importance of weight-bearing exercises. Continue Prolia  injections and vitamin D  supplementation. Encourage weight-bearing exercises, such as walking.

## 2024-08-24 NOTE — Assessment & Plan Note (Signed)
 Blood pressure is well-controlled with amlodipine  5 mg daily and losartan  - hydrochlorothiazide  50- 12.5 mg daily. No symptoms reported. Continue current medication regimen.

## 2024-08-24 NOTE — Assessment & Plan Note (Signed)
 Discussed diet and exercise for blood sugar management. Plan to check A1c. Advised on reducing carbohydrate intake and encouraged resumption of exercise, such as walking.

## 2024-09-08 ENCOUNTER — Other Ambulatory Visit: Payer: Self-pay

## 2024-09-08 MED ORDER — NAPROXEN 250 MG PO TABS
250.0000 mg | ORAL_TABLET | Freq: Three times a day (TID) | ORAL | 0 refills | Status: DC | PRN
  Filled 2024-09-08: qty 16, 6d supply, fill #0

## 2024-10-01 MED FILL — Losartan Potassium & Hydrochlorothiazide Tab 50-12.5 MG: ORAL | 90 days supply | Qty: 90 | Fill #3 | Status: AC

## 2024-10-06 ENCOUNTER — Telehealth: Payer: Self-pay

## 2024-10-06 NOTE — Telephone Encounter (Unsigned)
 Copied from CRM #[id number]. Topic: General - Other >> Oct 06, 2024  9:57 AM Franky GRADE wrote: Patient returning a call she received from [place], I advised patient of the message and informed patient she was seen on 08/31/2022 for her annual physical.

## 2024-10-06 NOTE — Telephone Encounter (Signed)
 Detailed vm left informing pt that she has not had a Physical since 08-31-2022 with Dr. Randine she has not had a physical recently.   E2C2 IT IS OKAY TO RELAY THAT INFO TO THE PT

## 2024-10-06 NOTE — Telephone Encounter (Signed)
 Copied from CRM #[id number]. Topic: General - Other >> Oct 06, 2024  9:17 AM Joyce Simpson wrote: Reason for CRM: Patient called in regarding needing her last annual physical date, looked back at the encounters and the reason for the appointments aren't annual exam, patient needs this information by today so she can update husband insurance . Would like a callback with this information

## 2024-10-27 ENCOUNTER — Other Ambulatory Visit: Payer: Self-pay | Admitting: Nurse Practitioner

## 2024-10-27 DIAGNOSIS — Z1231 Encounter for screening mammogram for malignant neoplasm of breast: Secondary | ICD-10-CM

## 2024-10-30 ENCOUNTER — Encounter: Admitting: Nurse Practitioner

## 2024-11-05 ENCOUNTER — Other Ambulatory Visit: Payer: Self-pay

## 2024-11-05 MED ORDER — CYCLOBENZAPRINE HCL 5 MG PO TABS
5.0000 mg | ORAL_TABLET | Freq: Two times a day (BID) | ORAL | 0 refills | Status: DC | PRN
  Filled 2024-11-05: qty 180, 90d supply, fill #0

## 2024-11-17 ENCOUNTER — Other Ambulatory Visit: Payer: Self-pay

## 2024-11-20 ENCOUNTER — Telehealth: Payer: Self-pay

## 2024-11-20 NOTE — Telephone Encounter (Signed)
 Prolia  VOB initiated via [email or web address]  Next Prolia  inj DUE: 12/19/24

## 2024-11-24 ENCOUNTER — Other Ambulatory Visit (HOSPITAL_COMMUNITY): Payer: Self-pay

## 2024-11-24 NOTE — Telephone Encounter (Signed)
 MEDICAL APPROVAL -     PHARMACY APPROVAL -  $750.00

## 2024-11-24 NOTE — Telephone Encounter (Signed)
 SABRA

## 2024-11-24 NOTE — Telephone Encounter (Signed)
 Pt ready for scheduling for PROLIA  on or after : 12/19/24  Option# 1: Buy/Bill (Office supplied medication)  Out-of-pocket cost due at time of clinic visit: $140 + DEDUCTIBLE  Number of injection/visits approved: 2  Primary: [employer] Prolia  co-insurance: $70 Admin fee co-insurance: $70  Secondary: --- Prolia  co-insurance:  Admin fee co-insurance:   Medical Benefit Details: Date Benefits were checked: 11/24/24 Deductible: $266.71 Met of $1200 Required/ Coinsurance: $70/ Admin Fee: $70  Prior Auth: APPROVED PA# [id number] Expiration Date: 06/08/24-06/07/25   # of doses approved: 2 ----------------------------------------------------------------------- Option# 2- Med Obtained from pharmacy:   Pharmacy benefit: Copay $0 - with [employer] copay card (Paid to pharmacy) Admin Fee: $70 (Pay at clinic)  Prior Auth: APPROVED PA#  Expiration Date: 05/06/24-05/05/25  # of doses approved: 2   If patient wants fill through the pharmacy benefit please send prescription to: [facility], and include estimated need by date in rx notes. Pharmacy will ship medication directly to the office.  Patient IS eligible for Prolia  Copay Card. Copay Card can make patient's cost as little as $25. Link to apply: [email or web address]://www.amgensupportplus.com/copay  ** This summary of benefits is an estimation of the patient's out-of-pocket cost. Exact cost may very based on individual plan coverage.

## 2024-12-04 ENCOUNTER — Encounter

## 2024-12-05 ENCOUNTER — Other Ambulatory Visit: Payer: Self-pay

## 2024-12-05 ENCOUNTER — Emergency Department

## 2024-12-05 ENCOUNTER — Emergency Department
Admission: EM | Admit: 2024-12-05 | Discharge: 2024-12-05 | Disposition: A | Discharge status: Home / Self Care | Attending: Emergency Medicine | Admitting: Emergency Medicine

## 2024-12-05 DIAGNOSIS — M7989 Other specified soft tissue disorders: Secondary | ICD-10-CM | POA: Diagnosis not present

## 2024-12-05 DIAGNOSIS — M25561 Pain in right knee: Secondary | ICD-10-CM

## 2024-12-05 DIAGNOSIS — I10 Essential (primary) hypertension: Secondary | ICD-10-CM | POA: Diagnosis not present

## 2024-12-05 DIAGNOSIS — M79661 Pain in right lower leg: Secondary | ICD-10-CM | POA: Diagnosis not present

## 2024-12-05 MED ORDER — OXYCODONE HCL 5 MG PO TABS
5.0000 mg | ORAL_TABLET | Freq: Once | ORAL | Status: AC
  Administered 2024-12-05: 5 mg via ORAL
  Filled 2024-12-05: qty 1

## 2024-12-05 MED ORDER — NAPROXEN 500 MG PO TABS
500.0000 mg | ORAL_TABLET | Freq: Two times a day (BID) | ORAL | 0 refills | Status: AC
  Filled 2024-12-05: qty 30, 15d supply, fill #0

## 2024-12-05 MED ORDER — METHOCARBAMOL 500 MG PO TABS
500.0000 mg | ORAL_TABLET | Freq: Three times a day (TID) | ORAL | 0 refills | Status: AC | PRN
  Filled 2024-12-05: qty 30, 10d supply, fill #0

## 2024-12-05 MED ORDER — NAPROXEN 500 MG PO TABS
500.0000 mg | ORAL_TABLET | Freq: Once | ORAL | Status: AC
  Administered 2024-12-05: 500 mg via ORAL
  Filled 2024-12-05: qty 1

## 2024-12-05 MED ORDER — METHOCARBAMOL 500 MG PO TABS
500.0000 mg | ORAL_TABLET | Freq: Once | ORAL | Status: AC
  Administered 2024-12-05: 500 mg via ORAL
  Filled 2024-12-05: qty 1

## 2024-12-05 NOTE — Discharge Instructions (Signed)
 Rest, ice, elevate and take medications as prescribed.  Be aware if you take the methocarbamol  it may make you sleepy or drowsy and you should not drive or operate any machinery for at least 8 hours after the last dose.   Follow-up with orthopedics if not improving over the next few days.

## 2024-12-05 NOTE — ED Provider Notes (Signed)
 [facility] Provider Note    Event Date/Time   First MD Initiated Contact with Patient 12/05/24 2214     (approximate)   History   Knee Pain   HPI  Joyce Simpson is a 63 y.o. female with history of hypertension, hyperlipidemia, sciatica and as listed in EMR presents to the emergency department for treatment and evaluation of right sided knee pain since about 2:00 this afternoon.  Sharp pain occurred while walking down the stairs.  Pain is now behind the right knee and radiates down into the calf.  No history of DVT or PE.     Physical Exam    Vitals:   12/05/24 2200  BP: (!) 150/70  Pulse: 90  Resp: 16  Temp: 98.5 F (36.9 C)  SpO2: 99%    General: Awake, no distress.  CV:  Good peripheral perfusion.  Resp:  Normal effort.  Abd:  No distention.  Other:  Pain in the right popliteal space.  Negative Homans' sign.  Able to bear weight.  Antalgic gait.   ED Results / Procedures / Treatments   Labs (all labs ordered are listed, but only abnormal results are displayed)  Labs Reviewed - No data to display   EKG     RADIOLOGY  Image and radiology report reviewed and interpreted by me. Radiology report consistent with the same.  Ultrasound is negative for DVT.  X-ray of the right knee is negative for acute concerns.  PROCEDURES:  Critical Care performed: No  Procedures   MEDICATIONS ORDERED IN ED:  Medications  methocarbamol  (ROBAXIN ) tablet 500 mg (has no administration in time range)  naproxen  (NAPROSYN ) tablet 500 mg (has no administration in time range)  oxyCODONE  (Oxy IR/ROXICODONE ) immediate release tablet 5 mg (5 mg Oral Given 12/05/24 2229)     IMPRESSION / MDM / ASSESSMENT AND PLAN / ED COURSE   I have reviewed the triage note and vital signs. Vital signs are stable   Differential diagnosis includes, but is not limited to, musculoskeletal strain, meniscus injury, ligament injury, Baker's cyst,  DVT  Patient's presentation is most consistent with acute illness / injury with system symptoms.  63 year old female presenting to the emergency department for treatment and evaluation of right knee pain that started earlier today.  She has diffuse pain in the popliteal space that radiates down into the right calf.  Plan will be to get an ultrasound to rule out DVT and give her pain medication.  Patient is aware and agreeable to this plan.  Imaging is reassuring.  Results discussed with the patient.  She had no relief after the oxycodone  therefore methocarbamol  and Naprosyn  was prescribed.  Also requested a hinged knee brace to be applied prior to discharge.  She was encouraged to follow-up with primary care/orthopedics if not improving over the next few days.  Symptomatic treatment advised as well.  Medication teaching completed.  Patient discharged in stable condition.     FINAL CLINICAL IMPRESSION(S) / ED DIAGNOSES   Final diagnoses:  Acute pain of right knee     Rx / DC Orders   ED Discharge Orders          Ordered    naproxen  (NAPROSYN ) 500 MG tablet  2 times daily with meals        12/05/24 2326    methocarbamol  (ROBAXIN ) 500 MG tablet  Every 8 hours PRN        12/05/24 2326  Note:  This document was prepared using Dragon voice recognition software and may include unintentional dictation errors.   Herlinda Kirk NOVAK, FNP 12/05/24 2329    Viviann Pastor, MD 12/06/24 2317

## 2024-12-05 NOTE — ED Triage Notes (Signed)
 Pt to ed from home via POV for right sided knee pain since 2pm today. Pt was walking down the stairs and had a sharp sudden stabbling like pain. Pt dit not fall. Pt is caox4, in no acute distress and ambulatory to triage.

## 2024-12-06 ENCOUNTER — Other Ambulatory Visit: Payer: Self-pay

## 2024-12-11 ENCOUNTER — Other Ambulatory Visit: Payer: Self-pay

## 2024-12-11 ENCOUNTER — Inpatient Hospital Stay: Admission: RE | Admit: 2024-12-11 | Discharge: 2024-12-11 | Attending: Nurse Practitioner

## 2024-12-11 ENCOUNTER — Other Ambulatory Visit: Payer: Self-pay | Admitting: Nurse Practitioner

## 2024-12-11 DIAGNOSIS — I10 Essential (primary) hypertension: Secondary | ICD-10-CM

## 2024-12-11 DIAGNOSIS — Z1231 Encounter for screening mammogram for malignant neoplasm of breast: Secondary | ICD-10-CM

## 2024-12-11 MED ORDER — AMLODIPINE BESYLATE 5 MG PO TABS
5.0000 mg | ORAL_TABLET | Freq: Every day | ORAL | 3 refills | Status: AC
  Filled 2024-12-11 (×2): qty 90, 90d supply, fill #0

## 2024-12-11 NOTE — Telephone Encounter (Signed)
 Copied from CRM #[id number]. Topic: Clinical - Medication Refill >> Dec 11, 2024  9:42 AM Laymon HERO wrote: Medication: amLODipine  (NORVASC ) 5 MG tablet  Has the patient contacted their pharmacy? Yes (Agent: If no, request that the patient contact the pharmacy for the refill. If patient does not wish to contact the pharmacy document the reason why and proceed with request.) (Agent: If yes, when and what did the pharmacy advise?)  This is the patient's preferred pharmacy:  [facility] [address] [place] [address] Phone: [phone] Fax: [phone]  Is this the correct pharmacy for this prescription? Yes If no, delete pharmacy and type the correct one.   Has the prescription been filled recently? Yes  Is the patient out of the medication? Yes  Has the patient been seen for an appointment in the last year OR does the patient have an upcoming appointment? Yes  Can we respond through MyChart? Yes  Agent: Please be advised that Rx refills may take up to 3 business days. We ask that you follow-up with your pharmacy.

## 2024-12-14 ENCOUNTER — Other Ambulatory Visit: Payer: Self-pay

## 2024-12-17 ENCOUNTER — Ambulatory Visit: Payer: Self-pay | Admitting: Nurse Practitioner

## 2024-12-18 ENCOUNTER — Other Ambulatory Visit: Payer: Self-pay

## 2024-12-18 ENCOUNTER — Ambulatory Visit

## 2024-12-18 ENCOUNTER — Other Ambulatory Visit (HOSPITAL_COMMUNITY): Payer: Self-pay

## 2024-12-18 MED ORDER — DENOSUMAB 60 MG/ML ~~LOC~~ SOSY
60.0000 mg | PREFILLED_SYRINGE | SUBCUTANEOUS | 1 refills | Status: DC
  Filled 2024-12-21: qty 1, 180d supply, fill #0

## 2024-12-18 NOTE — Telephone Encounter (Signed)
 Rx sent to [facility] and [employer] copay info given to patient. She will call today.   Pt scheduled for Pt supplied Prolia  on 12/21/24 with a $70 copay due at visit.

## 2024-12-18 NOTE — Addendum Note (Signed)
 Addended by: BRIEN SHARENE RAMAN on: 12/18/2024 04:34 PM   Modules accepted: Orders

## 2024-12-21 ENCOUNTER — Other Ambulatory Visit: Payer: Self-pay

## 2024-12-21 ENCOUNTER — Ambulatory Visit

## 2024-12-21 NOTE — Progress Notes (Signed)
 See benefits inv encounter from 11/20/24. Copay is $0. Patient has copay card.   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 Sent message to Herlene asking him to contact patient.

## 2024-12-22 ENCOUNTER — Other Ambulatory Visit: Payer: Self-pay

## 2024-12-22 ENCOUNTER — Other Ambulatory Visit: Payer: Self-pay | Admitting: Pharmacist

## 2024-12-22 ENCOUNTER — Encounter: Payer: Self-pay | Admitting: Pharmacist

## 2024-12-22 ENCOUNTER — Ambulatory Visit: Attending: Nurse Practitioner | Admitting: Pharmacist

## 2024-12-22 DIAGNOSIS — M810 Age-related osteoporosis without current pathological fracture: Secondary | ICD-10-CM

## 2024-12-22 DIAGNOSIS — Z79899 Other long term (current) drug therapy: Secondary | ICD-10-CM

## 2024-12-22 MED ORDER — DENOSUMAB 60 MG/ML ~~LOC~~ SOSY
60.0000 mg | PREFILLED_SYRINGE | SUBCUTANEOUS | 1 refills | Status: AC
  Filled 2024-12-22: qty 1, 180d supply, fill #0

## 2024-12-22 NOTE — Progress Notes (Signed)
 S:  Patient presents for review of their specialty medication therapy.  Patient is currently taking Prolia  for osteoporosis. Patient is managed by NO Leron Glance for this.   Adherence: confirms  Efficacy: reports that she has done well on the medication  Dosing: 60 mg subcutaneously once every 6 months   Dose adjustments: Renal: Monitor patients with severe impairment (CrCl <30 mL/minute or on dialysis) closely, as significant and prolonged hypocalcemia (incidence of 29% and potentially lasting weeks to months) and marked elevations of serum parathyroid hormone are serious risks in this population. Ensure adequate calcium and vitamin D  intake/supplementation. CrCl >=30 mL/minute: No dosage adjustment necessary. CrCl <30 mL/minute: No dosage adjustment necessary; use in conjunction with guidance from patient's nephrology team. Hepatic: no dose adjustments (has not been studied)  Drug-drug interactions: none  Screening: TB test: completed  Hepatitis: completed   Monitoring: S/sx of infection: none  S/sx of hypersensitivity: none S/sx of hypocalcemia/hypercalcemia: none Dermatitis/skin rash: none Peripheral edema: none HA: none GI upset: none   Other side effects: none   Last bone density study: 10/22/2022   O:      Lab Results  Component Value Date   WBC 5.3 08/06/2024   HGB 13.8 08/06/2024   HCT 41.7 08/06/2024   MCV 83.8 08/06/2024   PLT 252.0 08/06/2024      Chemistry      Component Value Date/Time   NA 140 08/06/2024 1554   NA 141 05/29/2016 1059   NA 142 05/05/2013 1056   K 3.7 08/06/2024 1554   K 4.1 05/05/2013 1056   CL 101 08/06/2024 1554   CL 110 (H) 05/05/2013 1056   CO2 30 08/06/2024 1554   CO2 27 05/05/2013 1056   BUN 10 08/06/2024 1554   BUN 10 05/29/2016 1059   BUN 10 05/05/2013 1056   CREATININE 0.75 08/06/2024 1554   CREATININE 0.90 09/23/2018 0800      Component Value Date/Time   CALCIUM 9.3 08/06/2024 1554   CALCIUM 9.1 05/05/2013  1056   ALKPHOS 76 08/06/2024 1554   AST 13 08/06/2024 1554   ALT 12 08/06/2024 1554   BILITOT 0.3 08/06/2024 1554   BILITOT 0.3 05/29/2016 1059       A/P: 1. Medication review: Patient currently on Prolia  for osteoporosis. Reviewed the medication with the patient, including the following: Prolia  (denosumab ) is a monoclonal antibody with affinity for nuclear factor-kappa ligand (RANKL). Prolia  binds to RANKL and prevents osteoclast formation, leading to decreased bone resorption and increased bone mass in osteoporosis. Patient educated on purpose, proper use, and potential adverse effects of Prolia . The most common adverse effects are hypersensitivities, peripheral edema, dermatitis/skin rash, GI upset, HA, joint pain, and infection. There is the possibility of atypical femur fracture, serum calcium disturbances, and osteonecrosis of the jaw. Patients should monitor for and report hip, thigh, or groin pain. Additionally, patients should monitor for and report jaw pain, tooth/periodontal infection, toothache, and/or gingival ulceration/erosion. Prolia  exists as a solution prefilled syringe for SQ administration. Administration: Denosumab  is intended for SubQ route only and should not be administered IV, IM, or intradermally. Prior to administration, bring to room temperature in original container (allow to stand ~15 to 30 minutes); do not warm by any other method. Solution may contain trace amounts of translucent to white protein particles; do not use if cloudy, discolored (normal solution should be clear and colorless to pale yellow), or contains excessive particles or foreign matter. Avoid vigorous shaking. Administer via SubQ injection in the upper arm,  upper thigh, or abdomen; should only be administered by a health care professional. No recommendations for any changes at this time.  Herlene Fleeta Morris, PharmD, JAQUELINE, CPP Clinical Pharmacist [facility] [phone]

## 2024-12-22 NOTE — Progress Notes (Signed)
 Please see OV from 10/22/24 for documentation.   Joyce Simpson, PharmD, JAQUELINE, CPP Clinical Pharmacist [facility] [phone]

## 2024-12-22 NOTE — Progress Notes (Signed)
 Specialty Pharmacy Initial Fill Coordination Note  Joyce Simpson is a 63 y.o. female contacted today regarding initial fill of specialty medication(s) Denosumab  (PROLIA )   Patient requested Courier to Provider Office   Delivery date: 12/28/24   Verified address: [facility] [address]   Medication will be filled on: 12/23/24   Patient is aware of $0 copayment.

## 2024-12-25 ENCOUNTER — Other Ambulatory Visit: Payer: Self-pay

## 2025-01-01 ENCOUNTER — Ambulatory Visit

## 2025-01-04 ENCOUNTER — Ambulatory Visit

## 2025-01-04 DIAGNOSIS — M810 Age-related osteoporosis without current pathological fracture: Secondary | ICD-10-CM | POA: Diagnosis not present

## 2025-01-04 MED ORDER — DENOSUMAB 60 MG/ML ~~LOC~~ SOSY: 60.0000 mg | PREFILLED_SYRINGE | SUBCUTANEOUS | Status: AC

## 2025-01-04 NOTE — Progress Notes (Signed)
 Pt presented for their subcutaneous Prolia injection. Pt was identified through two identifiers. Pt was given the information packets about the Prolia and told to schedule their next injection 6 months out. Pt tolerated the subq injection well in the left arm.

## 2025-01-18 ENCOUNTER — Other Ambulatory Visit: Payer: Self-pay

## 2025-01-21 ENCOUNTER — Other Ambulatory Visit: Payer: Self-pay

## 2025-01-22 ENCOUNTER — Other Ambulatory Visit: Payer: Self-pay

## 2025-01-22 ENCOUNTER — Other Ambulatory Visit: Payer: Self-pay | Admitting: Nurse Practitioner

## 2025-01-22 MED ORDER — LOSARTAN POTASSIUM-HCTZ 50-12.5 MG PO TABS
1.0000 | ORAL_TABLET | Freq: Every morning | ORAL | 3 refills | Status: AC
  Filled 2025-01-22: qty 90, 90d supply, fill #0

## 2025-01-25 ENCOUNTER — Other Ambulatory Visit: Payer: Self-pay

## 2025-01-27 ENCOUNTER — Other Ambulatory Visit (HOSPITAL_COMMUNITY): Payer: Self-pay

## 2025-01-27 ENCOUNTER — Other Ambulatory Visit: Payer: Self-pay

## 2025-01-28 ENCOUNTER — Other Ambulatory Visit (HOSPITAL_COMMUNITY): Payer: Self-pay

## 2025-02-05 ENCOUNTER — Ambulatory Visit: Admitting: Nurse Practitioner

## 2025-03-25 ENCOUNTER — Ambulatory Visit: Admitting: Nurse Practitioner

## 2025-07-05 ENCOUNTER — Ambulatory Visit
